# Patient Record
Sex: Female | Born: 1945
Health system: Southern US, Community
[De-identification: ages and names within clinical notes are randomized; demographics above are authoritative.]

## PROBLEM LIST (undated history)

## (undated) DIAGNOSIS — E119 Type 2 diabetes mellitus without complications: Secondary | ICD-10-CM

## (undated) DIAGNOSIS — Z860101 Personal history of adenomatous and serrated colon polyps: Secondary | ICD-10-CM

## (undated) DIAGNOSIS — R011 Cardiac murmur, unspecified: Secondary | ICD-10-CM

## (undated) DIAGNOSIS — I10 Essential (primary) hypertension: Secondary | ICD-10-CM

## (undated) DIAGNOSIS — E1140 Type 2 diabetes mellitus with diabetic neuropathy, unspecified: Secondary | ICD-10-CM

## (undated) DIAGNOSIS — J302 Other seasonal allergic rhinitis: Secondary | ICD-10-CM

## (undated) DIAGNOSIS — Z973 Presence of spectacles and contact lenses: Secondary | ICD-10-CM

## (undated) DIAGNOSIS — L732 Hidradenitis suppurativa: Secondary | ICD-10-CM

## (undated) DIAGNOSIS — Z923 Personal history of irradiation: Secondary | ICD-10-CM

## (undated) DIAGNOSIS — C50919 Malignant neoplasm of unspecified site of unspecified female breast: Secondary | ICD-10-CM

## (undated) DIAGNOSIS — R9389 Abnormal findings on diagnostic imaging of other specified body structures: Secondary | ICD-10-CM

## (undated) DIAGNOSIS — E785 Hyperlipidemia, unspecified: Secondary | ICD-10-CM

## (undated) DIAGNOSIS — Z972 Presence of dental prosthetic device (complete) (partial): Secondary | ICD-10-CM

## (undated) DIAGNOSIS — M1990 Unspecified osteoarthritis, unspecified site: Secondary | ICD-10-CM

## (undated) DIAGNOSIS — Z86010 Personal history of colonic polyps: Secondary | ICD-10-CM

## (undated) HISTORY — DX: Type 2 diabetes mellitus without complications: E11.9

## (undated) HISTORY — DX: Hyperlipidemia, unspecified: E78.5

## (undated) HISTORY — PX: BREAST SURGERY: SHX581

## (undated) HISTORY — DX: Hidradenitis suppurativa: L73.2

## (undated) HISTORY — DX: Other seasonal allergic rhinitis: J30.2

---

## 2000-07-24 ENCOUNTER — Ambulatory Visit (HOSPITAL_COMMUNITY): Admission: RE | Admit: 2000-07-24 | Discharge: 2000-07-24 | Payer: Self-pay | Admitting: Family Medicine

## 2000-07-24 ENCOUNTER — Encounter: Payer: Self-pay | Admitting: Family Medicine

## 2000-08-05 ENCOUNTER — Other Ambulatory Visit: Admission: RE | Admit: 2000-08-05 | Discharge: 2000-08-05 | Payer: Self-pay | Admitting: Family Medicine

## 2002-08-17 ENCOUNTER — Ambulatory Visit (HOSPITAL_COMMUNITY): Admission: RE | Admit: 2002-08-17 | Discharge: 2002-08-17 | Payer: Self-pay | Admitting: Family Medicine

## 2002-08-17 ENCOUNTER — Encounter: Payer: Self-pay | Admitting: Family Medicine

## 2004-05-08 ENCOUNTER — Ambulatory Visit: Payer: Self-pay | Admitting: Family Medicine

## 2004-05-16 ENCOUNTER — Ambulatory Visit (HOSPITAL_COMMUNITY): Admission: RE | Admit: 2004-05-16 | Discharge: 2004-05-16 | Payer: Self-pay | Admitting: Family Medicine

## 2004-05-16 LAB — HM MAMMOGRAPHY: HM Mammogram: NORMAL

## 2004-08-14 ENCOUNTER — Ambulatory Visit: Payer: Self-pay | Admitting: Family Medicine

## 2005-05-28 ENCOUNTER — Ambulatory Visit: Payer: Self-pay | Admitting: Family Medicine

## 2005-09-27 ENCOUNTER — Ambulatory Visit: Payer: Self-pay | Admitting: Family Medicine

## 2005-09-27 ENCOUNTER — Other Ambulatory Visit: Admission: RE | Admit: 2005-09-27 | Discharge: 2005-09-27 | Payer: Self-pay | Admitting: Family Medicine

## 2005-09-27 ENCOUNTER — Encounter: Payer: Self-pay | Admitting: Family Medicine

## 2005-12-27 ENCOUNTER — Ambulatory Visit: Payer: Self-pay | Admitting: Family Medicine

## 2006-05-12 ENCOUNTER — Ambulatory Visit: Payer: Self-pay | Admitting: Family Medicine

## 2006-07-16 ENCOUNTER — Encounter: Payer: Self-pay | Admitting: Family Medicine

## 2006-07-16 LAB — CONVERTED CEMR LAB: Hgb A1c MFr Bld: 7.2 % — ABNORMAL HIGH (ref 4.6–6.1)

## 2006-07-29 ENCOUNTER — Ambulatory Visit: Payer: Self-pay | Admitting: Family Medicine

## 2006-09-12 ENCOUNTER — Encounter: Payer: Self-pay | Admitting: Family Medicine

## 2006-09-12 LAB — CONVERTED CEMR LAB
ALT: 14 units/L (ref 0–35)
AST: 14 units/L (ref 0–37)
Albumin: 4.3 g/dL (ref 3.5–5.2)
Alkaline Phosphatase: 53 units/L (ref 39–117)
BUN: 10 mg/dL (ref 6–23)
Bilirubin, Direct: 0.1 mg/dL (ref 0.0–0.3)
CO2: 25 meq/L (ref 19–32)
Calcium: 9.5 mg/dL (ref 8.4–10.5)
Chloride: 104 meq/L (ref 96–112)
Cholesterol: 221 mg/dL — ABNORMAL HIGH (ref 0–200)
Creatinine, Ser: 0.87 mg/dL (ref 0.40–1.20)
Glucose, Bld: 109 mg/dL — ABNORMAL HIGH (ref 70–99)
HDL: 39 mg/dL — ABNORMAL LOW (ref >39)
Indirect Bilirubin: 0.2 mg/dL (ref 0.0–0.9)
LDL Cholesterol: 143 mg/dL — ABNORMAL HIGH (ref 0–99)
Potassium: 4.6 meq/L (ref 3.5–5.3)
Sodium: 141 meq/L (ref 135–145)
Total Bilirubin: 0.3 mg/dL (ref 0.3–1.2)
Total CHOL/HDL Ratio: 5.7
Total Protein: 7.4 g/dL (ref 6.0–8.3)
Triglycerides: 194 mg/dL — ABNORMAL HIGH (ref <150)
VLDL: 39 mg/dL (ref 0–40)

## 2006-09-26 ENCOUNTER — Ambulatory Visit: Payer: Self-pay | Admitting: Family Medicine

## 2007-02-13 ENCOUNTER — Encounter: Payer: Self-pay | Admitting: Family Medicine

## 2007-02-13 LAB — CONVERTED CEMR LAB
Cholesterol: 258 mg/dL — ABNORMAL HIGH (ref 0–200)
HDL: 43 mg/dL (ref >39)
LDL Cholesterol: 167 mg/dL — ABNORMAL HIGH (ref 0–99)
Total CHOL/HDL Ratio: 6
Triglycerides: 242 mg/dL — ABNORMAL HIGH (ref <150)
VLDL: 48 mg/dL — ABNORMAL HIGH (ref 0–40)

## 2007-02-16 ENCOUNTER — Ambulatory Visit: Payer: Self-pay | Admitting: Family Medicine

## 2007-02-16 LAB — CONVERTED CEMR LAB
ALT: 10 units/L (ref 0–35)
AST: 11 units/L (ref 0–37)
Albumin: 4.4 g/dL (ref 3.5–5.2)
Alkaline Phosphatase: 60 units/L (ref 39–117)
Total Bilirubin: 0.3 mg/dL (ref 0.3–1.2)

## 2007-04-24 ENCOUNTER — Ambulatory Visit: Payer: Self-pay | Admitting: Family Medicine

## 2007-08-07 ENCOUNTER — Ambulatory Visit: Payer: Self-pay | Admitting: Family Medicine

## 2007-08-10 ENCOUNTER — Encounter: Payer: Self-pay | Admitting: Family Medicine

## 2007-08-10 DIAGNOSIS — E1140 Type 2 diabetes mellitus with diabetic neuropathy, unspecified: Secondary | ICD-10-CM

## 2007-08-10 DIAGNOSIS — E1159 Type 2 diabetes mellitus with other circulatory complications: Secondary | ICD-10-CM

## 2007-08-10 DIAGNOSIS — L259 Unspecified contact dermatitis, unspecified cause: Secondary | ICD-10-CM

## 2007-08-10 DIAGNOSIS — J301 Allergic rhinitis due to pollen: Secondary | ICD-10-CM | POA: Insufficient documentation

## 2007-08-10 DIAGNOSIS — E1165 Type 2 diabetes mellitus with hyperglycemia: Secondary | ICD-10-CM | POA: Insufficient documentation

## 2007-08-10 DIAGNOSIS — E1169 Type 2 diabetes mellitus with other specified complication: Secondary | ICD-10-CM | POA: Insufficient documentation

## 2007-08-10 DIAGNOSIS — I10 Essential (primary) hypertension: Secondary | ICD-10-CM | POA: Insufficient documentation

## 2007-08-10 DIAGNOSIS — L732 Hidradenitis suppurativa: Secondary | ICD-10-CM | POA: Insufficient documentation

## 2007-08-10 DIAGNOSIS — E785 Hyperlipidemia, unspecified: Secondary | ICD-10-CM | POA: Insufficient documentation

## 2007-09-23 ENCOUNTER — Encounter: Payer: Self-pay | Admitting: Family Medicine

## 2007-10-08 ENCOUNTER — Encounter: Payer: Self-pay | Admitting: Family Medicine

## 2007-10-09 ENCOUNTER — Encounter: Payer: Self-pay | Admitting: Family Medicine

## 2007-11-23 ENCOUNTER — Encounter: Payer: Self-pay | Admitting: Family Medicine

## 2008-01-07 ENCOUNTER — Encounter: Payer: Self-pay | Admitting: Family Medicine

## 2008-01-11 ENCOUNTER — Telehealth: Payer: Self-pay | Admitting: Family Medicine

## 2008-01-11 LAB — CONVERTED CEMR LAB
ALT: 18 units/L (ref 0–35)
Bilirubin, Direct: 0.1 mg/dL (ref 0.0–0.3)
Cholesterol: 226 mg/dL — ABNORMAL HIGH (ref 0–200)
Total CHOL/HDL Ratio: 5.1
Total Protein: 7.2 g/dL (ref 6.0–8.3)
Triglycerides: 251 mg/dL — ABNORMAL HIGH (ref <150)
VLDL: 50 mg/dL — ABNORMAL HIGH (ref 0–40)

## 2008-01-18 ENCOUNTER — Ambulatory Visit: Payer: Self-pay | Admitting: Family Medicine

## 2008-01-18 LAB — CONVERTED CEMR LAB
Glucose, Bld: 94 mg/dL
Hgb A1c MFr Bld: 6.3 %

## 2008-01-28 ENCOUNTER — Telehealth: Payer: Self-pay | Admitting: Family Medicine

## 2008-03-04 ENCOUNTER — Ambulatory Visit: Payer: Self-pay | Admitting: Gastroenterology

## 2008-03-04 ENCOUNTER — Encounter: Payer: Self-pay | Admitting: Gastroenterology

## 2008-03-04 ENCOUNTER — Encounter: Payer: Self-pay | Admitting: Family Medicine

## 2008-03-04 ENCOUNTER — Ambulatory Visit (HOSPITAL_COMMUNITY): Admission: RE | Admit: 2008-03-04 | Discharge: 2008-03-04 | Payer: Self-pay | Admitting: Gastroenterology

## 2008-03-04 HISTORY — PX: COLONOSCOPY W/ POLYPECTOMY: SHX1380

## 2008-03-04 LAB — HM COLONOSCOPY

## 2008-03-07 ENCOUNTER — Encounter: Payer: Self-pay | Admitting: Family Medicine

## 2008-03-18 ENCOUNTER — Encounter: Payer: Self-pay | Admitting: Family Medicine

## 2008-04-08 ENCOUNTER — Encounter: Payer: Self-pay | Admitting: Family Medicine

## 2008-04-26 ENCOUNTER — Encounter: Payer: Self-pay | Admitting: Family Medicine

## 2008-05-16 ENCOUNTER — Ambulatory Visit: Payer: Self-pay | Admitting: Family Medicine

## 2008-09-16 ENCOUNTER — Ambulatory Visit: Payer: Self-pay | Admitting: Family Medicine

## 2008-09-26 LAB — CONVERTED CEMR LAB
AST: 13 units/L (ref 0–37)
Albumin: 4.2 g/dL (ref 3.5–5.2)
CO2: 24 meq/L (ref 19–32)
Chloride: 104 meq/L (ref 96–112)
HDL: 35 mg/dL — ABNORMAL LOW (ref >39)
LDL Cholesterol: 135 mg/dL — ABNORMAL HIGH (ref 0–99)
Microalb Creat Ratio: 8 mg/g (ref 0.0–30.0)
Sodium: 142 meq/L (ref 135–145)
Total Bilirubin: 0.3 mg/dL (ref 0.3–1.2)
Total CHOL/HDL Ratio: 6.6

## 2008-12-16 ENCOUNTER — Encounter: Payer: Self-pay | Admitting: Family Medicine

## 2009-02-09 ENCOUNTER — Ambulatory Visit: Payer: Self-pay | Admitting: Family Medicine

## 2009-02-09 DIAGNOSIS — R5381 Other malaise: Secondary | ICD-10-CM | POA: Insufficient documentation

## 2009-02-09 DIAGNOSIS — R5383 Other fatigue: Secondary | ICD-10-CM

## 2009-02-09 LAB — CONVERTED CEMR LAB: Blood Glucose, Fasting: 123 mg/dL

## 2009-02-14 LAB — CONVERTED CEMR LAB
ALT: 11 units/L (ref 0–35)
Albumin: 4.3 g/dL (ref 3.5–5.2)
Alkaline Phosphatase: 54 units/L (ref 39–117)
BUN: 11 mg/dL (ref 6–23)
Basophils Absolute: 0 10*3/uL (ref 0.0–0.1)
Chloride: 103 meq/L (ref 96–112)
Cholesterol: 191 mg/dL (ref 0–200)
Eosinophils Relative: 3 % (ref 0–5)
HCT: 37.5 % (ref 36.0–46.0)
HDL: 44 mg/dL (ref >39)
Indirect Bilirubin: 0.2 mg/dL (ref 0.0–0.9)
Lymphocytes Relative: 34 % (ref 12–46)
Lymphs Abs: 2 10*3/uL (ref 0.7–4.0)
Neutro Abs: 3.3 10*3/uL (ref 1.7–7.7)
Neutrophils Relative %: 57 % (ref 43–77)
Platelets: 312 10*3/uL (ref 150–400)
Potassium: 4.4 meq/L (ref 3.5–5.3)
RDW: 15.4 % (ref 11.5–15.5)
Total Protein: 7.2 g/dL (ref 6.0–8.3)
Triglycerides: 188 mg/dL — ABNORMAL HIGH (ref <150)
VLDL: 38 mg/dL (ref 0–40)
WBC: 5.9 10*3/uL (ref 4.0–10.5)

## 2009-03-06 ENCOUNTER — Encounter: Payer: Self-pay | Admitting: Family Medicine

## 2009-03-28 ENCOUNTER — Telehealth: Payer: Self-pay | Admitting: Family Medicine

## 2009-04-25 ENCOUNTER — Encounter: Payer: Self-pay | Admitting: Family Medicine

## 2009-06-07 ENCOUNTER — Ambulatory Visit: Payer: Self-pay | Admitting: Family Medicine

## 2009-06-12 ENCOUNTER — Encounter: Payer: Self-pay | Admitting: Family Medicine

## 2009-08-07 ENCOUNTER — Ambulatory Visit: Payer: Self-pay | Admitting: Family Medicine

## 2009-09-18 ENCOUNTER — Encounter: Payer: Self-pay | Admitting: Family Medicine

## 2009-09-18 LAB — CONVERTED CEMR LAB
Alkaline Phosphatase: 44 units/L (ref 39–117)
BUN: 10 mg/dL (ref 6–23)
Bilirubin, Direct: 0.1 mg/dL (ref 0.0–0.3)
CO2: 27 meq/L (ref 19–32)
Calcium: 9.5 mg/dL (ref 8.4–10.5)
Chloride: 102 meq/L (ref 96–112)
Creatinine, Ser: 0.84 mg/dL (ref 0.40–1.20)
Indirect Bilirubin: 0.2 mg/dL (ref 0.0–0.9)
LDL Cholesterol: 77 mg/dL (ref 0–99)
Total Bilirubin: 0.3 mg/dL (ref 0.3–1.2)

## 2009-09-22 ENCOUNTER — Ambulatory Visit: Payer: Self-pay | Admitting: Family Medicine

## 2009-09-22 LAB — HM DIABETES FOOT EXAM

## 2009-09-24 DIAGNOSIS — E559 Vitamin D deficiency, unspecified: Secondary | ICD-10-CM

## 2009-11-07 ENCOUNTER — Encounter: Payer: Self-pay | Admitting: Family Medicine

## 2010-01-31 LAB — CONVERTED CEMR LAB
ALT: 11 units/L (ref 0–35)
Bilirubin, Direct: 0.1 mg/dL (ref 0.0–0.3)
CO2: 28 meq/L (ref 19–32)
Chloride: 102 meq/L (ref 96–112)
Cholesterol: 157 mg/dL (ref 0–200)
Glucose, Bld: 127 mg/dL — ABNORMAL HIGH (ref 70–99)
Hgb A1c MFr Bld: 6.6 % — ABNORMAL HIGH (ref <5.7)
LDL Cholesterol: 74 mg/dL (ref 0–99)
Potassium: 4.5 meq/L (ref 3.5–5.3)
Sodium: 140 meq/L (ref 135–145)
Total Bilirubin: 0.3 mg/dL (ref 0.3–1.2)
Total CHOL/HDL Ratio: 3.7
VLDL: 41 mg/dL — ABNORMAL HIGH (ref 0–40)
Vit D, 25-Hydroxy: 38 ng/mL (ref 30–89)

## 2010-02-01 ENCOUNTER — Telehealth: Payer: Self-pay | Admitting: Family Medicine

## 2010-02-07 ENCOUNTER — Ambulatory Visit: Payer: Self-pay | Admitting: Family Medicine

## 2010-02-07 LAB — CONVERTED CEMR LAB: Microalb Creat Ratio: 15.6 mg/g (ref 0.0–30.0)

## 2010-02-13 ENCOUNTER — Ambulatory Visit: Payer: Self-pay | Admitting: Family Medicine

## 2010-02-16 LAB — HM DIABETES FOOT EXAM

## 2010-02-16 LAB — HM MAMMOGRAPHY

## 2010-02-16 LAB — HM COLONOSCOPY

## 2010-03-06 ENCOUNTER — Telehealth: Payer: Self-pay | Admitting: Family Medicine

## 2010-03-07 ENCOUNTER — Ambulatory Visit
Admission: RE | Admit: 2010-03-07 | Discharge: 2010-03-07 | Payer: Self-pay | Source: Home / Self Care | Attending: Family Medicine | Admitting: Family Medicine

## 2010-03-07 DIAGNOSIS — J029 Acute pharyngitis, unspecified: Secondary | ICD-10-CM | POA: Insufficient documentation

## 2010-03-11 ENCOUNTER — Encounter: Payer: Self-pay | Admitting: Family Medicine

## 2010-03-20 NOTE — Assessment & Plan Note (Signed)
Summary: office visit   Vital Signs:  Patient profile:   65 year old female Menstrual status:  postmenopausal Height:      69 inches Weight:      271 pounds BMI:     40.16 O2 Sat:      97 % Pulse rate:   68 / minute Pulse rhythm:   regular Resp:     16 per minute BP sitting:   122 / 74  (left arm) Cuff size:   large  Vitals Entered By: Everitt Amber LPN (September 22, 2009 8:18 AM)  Nutrition Counseling: Patient's BMI is greater than 25 and therefore counseled on weight management options. CC: Follow up chronic problems   CC:  Follow up chronic problems.  History of Present Illness: Reports  thatshe has been  doing well. She unfortunately still has not commited to regular exercise amd she has gained rather than lost weight. States she wants anapetitie suppressant, but does not want to committo coming in evry 2 months for evaluation. She tests her blood sugar at least 5 times weekly, fasting sugars are seldom ver 120. She denies intolerance to her meds. Denies recent fever or chills. Denies sinus pressure, nasal congestion , ear pain or sore throat. Denies chest congestion, or cough productive of sputum. Denies chest pain, palpitations, PND, orthopnea or leg swelling. Denies abdominal pain, nausea, vomitting, diarrhea or constipation. Denies change in bowel movements or bloody stool. Denies dysuria , frequency, incontinence or hesitancy. Denies  joint pain, swelling, or reduced mobility. Denies headaches, vertigo, seizures. Denies depression, anxiety or insomnia. She continues to have recurrent cysts under the arms, but does not wish to have surgery.   Current Medications (verified): 1)  Ziac 10-6.25 Mg  Tabs (Bisoprolol-Hydrochlorothiazide) .... One Tab By Mouth Once Daily 2)  Lovastatin 40 Mg  Tabs (Lovastatin) .... One Tab By Mouth At Bedtime 3)  Lotensin 20 Mg Tabs (Benazepril Hcl) .... Take 1 Tablet By Mouth Once A Day 4)  Metformin Hcl 500 Mg Xr24h-Tab (Metformin Hcl)  .... Two Tabs By Mouth Two Times A Day 5)  Freestyle Lite Test  Strp (Glucose Blood) .... Up To Four Times Daily 6)  Vitamin D (Ergocalciferol) 50000 Unit Caps (Ergocalciferol) .... One Cap By Mouth Every Week  Allergies (verified): No Known Drug Allergies  Review of Systems      See HPI General:  Complains of fatigue. Eyes:  Denies blurring, discharge, eye pain, and red eye; eye exam, past due states she will make an appt. MS:  Complains of stiffness. Derm:  Complains of lesion(s); severe hydrsdenitis of axillae. Endo:  Denies cold intolerance, excessive hunger, excessive thirst, excessive urination, heat intolerance, polyuria, and weight change. Heme:  Denies abnormal bruising and bleeding. Allergy:  Complains of seasonal allergies; denies hives or rash and itching eyes; symptoms mild, otc meds as needed.  Physical Exam  General:  Well-developed,obese,in no acute distress; alert,appropriate and cooperative throughout examination HEENT: No facial asymmetry,  EOMI, No sinus tenderness, TM's Clear, oropharynx  pink and moist.   Chest: Clear to auscultation bilaterally.  CVS: S1, S2, No murmurs, No S3.   Abd: Soft, Nontender.  MS: Adequate ROM spine, hips, shoulders and knees.  Ext: No edema.   CNS: CN 2-12 intact, power tone and sensation normal throughout.   Skin: cysts in axillae. no purulent drainage Psych: Good eye contact, normal affect.  Memory intact, not anxious or depressed appearing.   Diabetes Management Exam:    Foot Exam (with socks  and/or shoes not present):       Sensory-Monofilament:          Left foot: diminished          Right foot: diminished       Inspection:          Left foot: normal          Right foot: normal       Nails:          Left foot: thickened          Right foot: thickened   Impression & Recommendations:  Problem # 1:  HIDRADENITIS (ICD-705.83) Assessment Unchanged no interest in surgical intervention  Problem # 2:  HYPERTENSION  (ICD-401.9) Assessment: Unchanged  The following medications were removed from the medication list:    Ziac 10-6.25 Mg Tabs (Bisoprolol-hydrochlorothiazide) ..... One tab by mouth once daily    Lotensin 20 Mg Tabs (Benazepril hcl) .Marland Kitchen... Take 1 tablet by mouth once a day Her updated medication list for this problem includes:    Lotensin 20 Mg Tabs (Benazepril hcl) .Marland Kitchen... Take 1 tablet by mouth once a day    Ziac 10-6.25 Mg Tabs (Bisoprolol-hydrochlorothiazide) .Marland Kitchen... Take 1 tablet by mouth once a day  Orders: T-Basic Metabolic Panel [id number])  BP today: 122/74 Prior BP: 120/68 (08/07/2009)  Labs Reviewed: K+: 4.4 (09/18/2009) Creat: : 0.84 (09/18/2009)   Chol: 158 (09/18/2009)   HDL: 43 (09/18/2009)   LDL: 77 (09/18/2009)   TG: 189 (09/18/2009)  Problem # 3:  DIABETES MELLITUS, TYPE II (ICD-250.00) Assessment: Improved  The following medications were removed from the medication list:    Lotensin 20 Mg Tabs (Benazepril hcl) .Marland Kitchen... Take 1 tablet by mouth once a day Her updated medication list for this problem includes:    Metformin Hcl 500 Mg Xr24h-tab (Metformin hcl) .Marland Kitchen..Marland Kitchen Two tabs by mouth two times a day    Lotensin 20 Mg Tabs (Benazepril hcl) .Marland Kitchen... Take 1 tablet by mouth once a day  Orders: Ophthalmology Referral (Ophthalmology) T- Hemoglobin A1C ([id number]) T-Urine Microalbumin w/creat. ratio [phone])  Labs Reviewed: Creat: 0.84 (09/18/2009)    Reviewed HgBA1c results: 6.5 (09/18/2009)  6.7 (06/07/2009)  Problem # 4:  OBESITY (ICD-278.00) Assessment: Deteriorated  Ht: 69 (09/22/2009)   Wt: 271 (09/22/2009)   BMI: 40.16 (09/22/2009)  Problem # 5:  UNSPECIFIED VITAMIN D DEFICIENCY (ICD-268.9) Assessment: Improved  Complete Medication List: 1)  Metformin Hcl 500 Mg Xr24h-tab (Metformin hcl) .... Two tabs by mouth two times a day 2)  Freestyle Lite Test Strp (Glucose blood) .... Up to four times daily 3)  Lotensin 20 Mg Tabs (Benazepril hcl) .... Take 1  tablet by mouth once a day 4)  Lovastatin 40 Mg Tabs (Lovastatin) .... Take 1 tab by mouth at bedtime 5)  Ziac 10-6.25 Mg Tabs (Bisoprolol-hydrochlorothiazide) .... Take 1 tablet by mouth once a day  Other Orders: T-Hepatic Function [id number]) T-Lipid Profile [id number]) T-Vitamin D (25-Hydroxy) [id number])  Patient Instructions: 1)  f/u end  December. 2)  It is important that you exercise regularly at least 20 minutes 5 times a week. If you develop chest pain, have severe difficulty breathing, or feel very tired , stop exercising immediately and seek medical attention. 3)  You need to lose weight. Consider a lower calorie diet and regular exercise.  4)  vit d lipid, hepatic, chem 7, HBA1C. 5)  you will be referred for eye exam and mamogram 6)  pLS start calcium with vit D 1200mg /1000iU one daily Prescriptions:  ZIAC 10-6.25 MG TABS (BISOPROLOL-HYDROCHLOROTHIAZIDE) Take 1 tablet by mouth once a day  #90 x 1   Entered and Authorized by:   Syliva Overman MD   Signed by:   Syliva Overman MD on 09/22/2009   Method used:   Electronically to        [facility]* (retail)       [address]       [place], [place]  [address]       Ph: [phone] or [phone]       Fax: [phone]   RxID:   [id number] LOVASTATIN 40 MG TABS (LOVASTATIN) Take 1 tab by mouth at bedtime  #90 x 1   Entered and Authorized by:   Syliva Overman MD   Signed by:   Syliva Overman MD on 09/22/2009   Method used:   Electronically to        [facility]* (retail)       [address]       [place], [place]  [address]       Ph: [phone] or [phone]       Fax: [phone]   RxID:   [id number] LOTENSIN 20 MG TABS (BENAZEPRIL HCL) Take 1 tablet by mouth once a day  #90 x 1   Entered and Authorized by:   Syliva Overman MD   Signed by:   Syliva Overman MD on 09/22/2009   Method used:   Electronically to        [facility]* (retail)       [address]       [place], [place]  [address]       Ph: [phone] or [phone]       Fax: [phone]   RxID:   [id number] METFORMIN HCL 500 MG XR24H-TAB (METFORMIN HCL) two tabs by mouth two times a day  #120 x 3   Entered by:   Everitt Amber LPN   Authorized by:   Syliva Overman MD   Signed by:   Everitt Amber LPN on 81/82/9937   Method used:   Electronically to        [facility]* (retail)       [address]       [place], [place]  [address]       Ph: [phone] or [phone]       Fax: [phone]   RxID:   [id number] LOTENSIN 20 MG TABS (BENAZEPRIL HCL) Take 1 tablet by mouth once a day  #30 x 3   Entered by:   Everitt Amber LPN   Authorized by:   Syliva Overman MD   Signed by:   Everitt Amber LPN on 67/01/4579   Method used:   Electronically to        [facility] (retail)       [address]       [place], [place]  [address]       Ph: [phone] or [phone]       Fax: [phone]   RxID:   [id number] LOVASTATIN 40 MG  TABS (LOVASTATIN) one tab by mouth at bedtime  #30 x 3   Entered by:   Everitt Amber LPN   Authorized by:   Syliva Overman MD   Signed  by:   Everitt Amber LPN on 62/13/0865   Method used:   Electronically to        [facility] (retail)       [address]       [place], [place]  [address]       Ph: [phone] or [phone]       Fax: [phone]   RxID:   [id number] ZIAC 10-6.25 MG  TABS (BISOPROLOL-HYDROCHLOROTHIAZIDE) one tab by mouth once daily  #30 x 3   Entered by:   Everitt Amber LPN   Authorized by:   Syliva Overman MD   Signed by:   Everitt Amber LPN on 88/41/6606   Method used:   Electronically to        [facility] (retail)       [address]       [place], [place]  [address]       Ph: [phone] or [phone]       Fax: [phone]    RxID:   [id number]   Appended Document: office visit pt needs both eye exam , I think she said she will schedule both of them, pls check with her before scheduling either  Appended Document: office visit pt information was sent to dr. Nile Riggs office. they will call pt with appt and do a eye exam on both eyes.

## 2010-03-20 NOTE — Miscellaneous (Signed)
Summary: refill  Clinical Lists Changes  Medications: Rx of ZIAC 10-6.25 MG  TABS (BISOPROLOL-HYDROCHLOROTHIAZIDE) one tab by mouth once daily;  #30 x 4;  Signed;  Entered by: Worthy Keeler LPN;  Authorized by: Syliva Overman MD;  Method used: Electronically to [facility] [address], [place], [place], [place]  [address], Ph: [phone] or [phone], Fax: [phone]    Prescriptions: ZIAC 10-6.25 MG  TABS (BISOPROLOL-HYDROCHLOROTHIAZIDE) one tab by mouth once daily  #30 x 4   Entered by:   Worthy Keeler LPN   Authorized by:   Syliva Overman MD   Signed by:   Worthy Keeler LPN on 42/59/5638   Method used:   Electronically to        [facility] (retail)       [address]       [place], [place]  [address]       Ph: [phone] or [phone]       Fax: [phone]   RxID:   [id number]

## 2010-03-20 NOTE — Letter (Signed)
[facility]: [facility]   Imported By: Lind Guest 11/08/2009 09:02:52  _____________________________________________________________________  External Attachment:    Type:   Image     Comment:   External Document

## 2010-03-20 NOTE — Letter (Signed)
Summary: Letter  Letter   Imported By: Lind Guest 06/13/2009 11:51:40  _____________________________________________________________________  External Attachment:    Type:   Image     Comment:   External Document

## 2010-03-20 NOTE — Assessment & Plan Note (Signed)
Summary: office visit   Vital Signs:  Patient profile:   65 year old female Menstrual status:  postmenopausal Height:      69 inches Weight:      270.50 pounds BMI:     40.09 O2 Sat:      95 % Pulse rate:   88 / minute Pulse rhythm:   regular Resp:     16 per minute BP sitting:   112 / 70  (left arm) Cuff size:   large  Vitals Entered By: Everitt Amber LPN (June 07, 2009 3:21 PM)  Nutrition Counseling: Patient's BMI is greater than 25 and therefore counseled on weight management options. CC: Follow up chronic problems   CC:  Follow up chronic problems.  History of Present Illness: Reports  thatshe has been  doing well. Denies recent fever or chills. Denies sinus pressure, nasal congestion , ear pain or sore throat. Denies chest congestion, or cough productive of sputum. Denies chest pain, palpitations, PND, orthopnea or leg swelling. Denies abdominal pain, nausea, vomitting, diarrhea or constipation. Denies change in bowel movements or bloody stool. Denies dysuria , frequency, incontinence or hesitancy. Denies  joint pain, swelling, or reduced mobility. Denies headaches, vertigo, seizures. Denies depression, anxiety or insomnia. continued left axillary adenitis, no definitive treatment desired at this time. Pt not exercising regularly, and not consitent with dietary modification     Current Medications (verified): 1)  Ziac 10-6.25 Mg  Tabs (Bisoprolol-Hydrochlorothiazide) .... One Tab By Mouth Once Daily 2)  Lovastatin 40 Mg  Tabs (Lovastatin) .... One Tab By Mouth At Bedtime 3)  Clindamycin Phosphate 1 % Soln (Clindamycin Phosphate) .... Apply To Underarms Two Times A Day 4)  Lotensin 20 Mg Tabs (Benazepril Hcl) .... Take 1 Tablet By Mouth Once A Day 5)  Metformin Hcl 500 Mg Xr24h-Tab (Metformin Hcl) .... Two Tabs By Mouth Two Times A Day 6)  Freestyle Lite Test  Strp (Glucose Blood) .... Up To Four Times Daily  Allergies (verified): No Known Drug  Allergies  Review of Systems      See HPI Eyes:  Denies blurring and discharge. Derm:  Complains of lesion(s); swevere hydranitis under both axillae, lef worse than right , no currentdrainage. Endo:  Denies cold intolerance, excessive hunger, excessive thirst, excessive urination, heat intolerance, polyuria, and weight change; tests daily ranges are from 90 to 120. Heme:  Denies abnormal bruising and bleeding. Allergy:  Complains of seasonal allergies.  Physical Exam  General:  Well-developed,obese,in no acute distress; alert,appropriate and cooperative throughout examination HEENT: No facial asymmetry,  EOMI, No sinus tenderness, TM's Clear, oropharynx  pink and moist.   Chest: Clear to auscultation bilaterally.  CVS: S1, S2, No murmurs, No S3.   Abd: Soft, Nontender.  MS: Adequate ROM spine, hips, shoulders and knees.  Ext: No edema.   CNS: CN 2-12 intact, power tone and sensation normal throughout.   Skin: Intact, cystic red lesions in axillae Psych: Good eye contact, normal affect.  Memory intact, not anxious or depressed appearing.    Impression & Recommendations:  Problem # 1:  HIDRADENITIS (ICD-705.83) Assessment Unchanged  Problem # 2:  HYPERLIPIDEMIA (ICD-272.4) Assessment: Improved  Her updated medication list for this problem includes:    Lovastatin 40 Mg Tabs (Lovastatin) ..... One tab by mouth at bedtime  Orders: T-Hepatic Function [id number]) T-Lipid Profile [id number])  Labs Reviewed: SGOT: 11 (02/09/2009)   SGPT: 11 (02/09/2009)   HDL:44 (02/09/2009), 35 (09/16/2008)  LDL:109 (02/09/2009), 135 (09/16/2008)  Chol:191 (02/09/2009), 232 (09/16/2008)  Trig:188 (02/09/2009), 308 (09/16/2008)improved , based on labs drawn in march 2011  Problem # 3:  HYPERTENSION (ICD-401.9) Assessment: Improved  Her updated medication list for this problem includes:    Ziac 10-6.25 Mg Tabs (Bisoprolol-hydrochlorothiazide) ..... One tab by mouth once daily    Lotensin  20 Mg Tabs (Benazepril hcl) .Marland Kitchen... Take 1 tablet by mouth once a day  Orders: T-Basic Metabolic Panel [id number])  BP today: 112/70 Prior BP: 122/82 (02/09/2009)  Labs Reviewed: K+: 4.4 (02/09/2009) Creat: : 0.77 (02/09/2009)   Chol: 191 (02/09/2009)   HDL: 44 (02/09/2009)   LDL: 109 (02/09/2009)   TG: 188 (02/09/2009)  Problem # 4:  OBESITY (ICD-278.00) Assessment: Unchanged  Ht: 69 (06/07/2009)   Wt: 270.50 (06/07/2009)   BMI: 40.09 (06/07/2009)  Problem # 5:  DIABETES MELLITUS, TYPE II (ICD-250.00) Assessment: Comment Only  Her updated medication list for this problem includes:    Lotensin 20 Mg Tabs (Benazepril hcl) .Marland Kitchen... Take 1 tablet by mouth once a day    Metformin Hcl 500 Mg Xr24h-tab (Metformin hcl) .Marland Kitchen..Marland Kitchen Two tabs by mouth two times a day  Orders: T- Hemoglobin A1C ([id number]) T- Hemoglobin A1C ([id number])  Labs Reviewed: Creat: 0.77 (02/09/2009)    Reviewed HgBA1c results: 6.7 (02/09/2009)  6.8 (09/16/2008)  Complete Medication List: 1)  Ziac 10-6.25 Mg Tabs (Bisoprolol-hydrochlorothiazide) .... One tab by mouth once daily 2)  Lovastatin 40 Mg Tabs (Lovastatin) .... One tab by mouth at bedtime 3)  Clindamycin Phosphate 1 % Soln (Clindamycin phosphate) .... Apply to underarms two times a day 4)  Lotensin 20 Mg Tabs (Benazepril hcl) .... Take 1 tablet by mouth once a day 5)  Metformin Hcl 500 Mg Xr24h-tab (Metformin hcl) .... Two tabs by mouth two times a day 6)  Freestyle Lite Test Strp (Glucose blood) .... Up to four times daily  Other Orders: T-Vitamin D (25-Hydroxy) [id number])  Patient Instructions: 1)  Please schedule a follow-up appointment in 4 months. 2)  It is important that you exercise regularly at least 20 minutes 5 times a week. If you develop chest pain, have severe difficulty breathing, or feel very tired , stop exercising immediately and seek medical attention. 3)  You need to lose weight. Consider a lower calorie diet and regular  exercise.  4)  HbgA1C prior to visit, ICD-9:   today 5)  Vitamin V 6)  fasting lipid , hepatic chem7, and HBA1C in  4 months 7)  Pls follow a low fat , low carb diet

## 2010-03-20 NOTE — Progress Notes (Signed)
  Phone Note From Pharmacy   Caller: [facility]* Summary of Call: can you change metformin to ER, its coated- the regular smells soo bad she cant take it er 500mg  two two times a day  Initial call taken by: Worthy Keeler LPN,  March 28, 2009 2:21 PM  Follow-up for Phone Call        pls change to requested form and keep same dose and directins, let herknow Follow-up by: Syliva Overman MD,  March 28, 2009 4:59 PM  Additional Follow-up for Phone Call Additional follow up Details #1::        Prescription resent Additional Follow-up by: Worthy Keeler LPN,  March 29, 2009 9:10 AM    New/Updated Medications: METFORMIN HCL 500 MG XR24H-TAB (METFORMIN HCL) two tabs by mouth two times a day Prescriptions: METFORMIN HCL 500 MG XR24H-TAB (METFORMIN HCL) two tabs by mouth two times a day  #120 x 2   Entered by:   Worthy Keeler LPN   Authorized by:   Syliva Overman MD   Signed by:   Worthy Keeler LPN on 40/34/7425   Method used:   Electronically to        [facility]* (retail)       [address]       [place], [place]  [address]       Ph: [phone] or [phone]       Fax: [phone]   RxID:   [id number]

## 2010-03-20 NOTE — Assessment & Plan Note (Signed)
Summary: sick- room 2   Vital Signs:  Patient profile:   65 year old female Menstrual status:  postmenopausal Height:      69 inches Weight:      268.75 pounds BMI:     39.83 O2 Sat:      96 % on Room air Temp:     99 degrees F oral Pulse rate:   85 / minute Resp:     16 per minute BP sitting:   120 / 68  (left arm)  Vitals Entered By: Adella Hare LPN (August 07, 2009 1:59 PM) CC: chest congestion, cough with green sputum Is Patient Diabetic? No Pain Assessment Patient in pain? no      Comments did not bring meds to ov   CC:  chest congestion and cough with green sputum.  History of Present Illness: Pt presents today with c/o chest congestion and cough x 1 week.  Cough is productive with green phlegm.  No nasal congestion or drainage.  Pt states her syptoms are worsening.  She has been using an over the counter cough med w/o much improvement.  She had a sore thrt at onset, but is gone now. Pt has a hx of seansonal allergies but denies current syptoms.  She has no other complaints or concerns.  She is due in Aug for her check up for HTN and DM.  Pt has not been checking her blood sugars recently.     Allergies (verified): No Known Drug Allergies  Past History:  Past medical history reviewed for relevance to current acute and chronic problems.  Past Medical History: Reviewed history from 08/10/2007 and no changes required. Current Problems:  ALLERGIC RHINITIS, SEASONAL (ICD-477.0) DERMATITIS, FEET (ICD-692.9) HIDRADENITIS (ICD-705.83) OBESITY (ICD-278.00) HYPERTENSION (ICD-401.9) DIABETES MELLITUS, TYPE II (ICD-250.00) HYPERLIPIDEMIA (ICD-272.4)  Review of Systems General:  Denies chills and fever. ENT:  Denies earache, nasal congestion, postnasal drainage, sinus pressure, and sore throat. CV:  Denies chest pain or discomfort. Resp:  Complains of cough and sputum productive; denies shortness of breath and wheezing.  Physical Exam  General:   Well-developed,well-nourished,in no acute distress; alert,appropriate and cooperative throughout examination Head:  Normocephalic and atraumatic without obvious abnormalities. No apparent alopecia or balding. Ears:  External ear exam shows no significant lesions or deformities.  Otoscopic examination reveals clear canals, tympanic membranes are intact bilaterally without bulging, retraction, inflammation or discharge. Hearing is grossly normal bilaterally. Nose:  External nasal examination shows no deformity or inflammation. Nasal mucosa are pink and moist without lesions or exudates.no sinus percussion tenderness.   Mouth:  Oral mucosa and oropharynx without lesions or exudates. . Neck:  No deformities, masses, or tenderness noted. Lungs:  Normal respiratory effort, chest expands symmetrically. Lungs are clear to auscultation, no crackles or wheezes. Heart:  Normal rate and regular rhythm. S1 and S2 normal without gallop, murmur, click, rub or other extra sounds. Cervical Nodes:  No lymphadenopathy noted Psych:  Cognition and judgment appear intact. Alert and cooperative with normal attention span and concentration. No apparent delusions, illusions, hallucinations   Impression & Recommendations:  Problem # 1:  ACUTE BRONCHITIS (ICD-466.0) Assessment New Pt declined injection.  Her updated medication list for this problem includes:    Benzonatate 100 Mg Caps (Benzonatate) .Marland Kitchen... Take 1 every 8 hrs as needed for cough    Cephalexin 500 Mg Tabs (Cephalexin) .Marland Kitchen... Take 1 three times a day x 10 days  Problem # 2:  HYPERTENSION (ICD-401.9) Assessment: Comment Only  Her updated medication list  for this problem includes:    Ziac 10-6.25 Mg Tabs (Bisoprolol-hydrochlorothiazide) ..... One tab by mouth once daily    Lotensin 20 Mg Tabs (Benazepril hcl) .Marland Kitchen... Take 1 tablet by mouth once a day  BP today: 120/68 Prior BP: 112/70 (06/07/2009)  Labs Reviewed: K+: 4.4 (02/09/2009) Creat: : 0.77  (02/09/2009)   Chol: 191 (02/09/2009)   HDL: 44 (02/09/2009)   LDL: 109 (02/09/2009)   TG: 188 (02/09/2009)  Complete Medication List: 1)  Ziac 10-6.25 Mg Tabs (Bisoprolol-hydrochlorothiazide) .... One tab by mouth once daily 2)  Lovastatin 40 Mg Tabs (Lovastatin) .... One tab by mouth at bedtime 3)  Lotensin 20 Mg Tabs (Benazepril hcl) .... Take 1 tablet by mouth once a day 4)  Metformin Hcl 500 Mg Xr24h-tab (Metformin hcl) .... Two tabs by mouth two times a day 5)  Freestyle Lite Test Strp (Glucose blood) .... Up to four times daily 6)  Vitamin D (ergocalciferol) 50000 Unit Caps (Ergocalciferol) .... One cap by mouth every week 7)  Benzonatate 100 Mg Caps (Benzonatate) .... Take 1 every 8 hrs as needed for cough 8)  Cephalexin 500 Mg Tabs (Cephalexin) .... Take 1 three times a day x 10 days  Patient Instructions: 1)  Please schedule a follow-up appointment in 2 months wit Dr Lodema Hong.  2)  Call if your syptoms do not improve in the next 1 week. 3)  I have prescribed an antibiotic for you and some cough medicine. 4)  Increase your fluids and rest. Prescriptions: CEPHALEXIN 500 MG TABS (CEPHALEXIN) take 1 three times a day x 10 days  #30 x 0   Entered and Authorized by:   Esperanza Sheets PA   Signed by:   Esperanza Sheets PA on 08/07/2009   Method used:   Electronically to        [facility]* (retail)       [address]       [place], [place]  [address]       Ph: [phone] or [phone]       Fax: [phone]   RxID:   [id number] BENZONATATE 100 MG CAPS (BENZONATATE) take 1 every 8 hrs as needed for cough  #30 x 0   Entered and Authorized by:   Esperanza Sheets PA   Signed by:   Esperanza Sheets PA on 08/07/2009   Method used:   Electronically to        [facility]* (retail)       [address]       [place], [place]  [address]       Ph: [phone] or [phone]       Fax: [phone]   RxID:   [id number]

## 2010-03-22 NOTE — Progress Notes (Signed)
Summary: sore throat  Phone Note Call from Patient   Summary of Call: sore throat wants to be seen  call back at [phone] Initial call taken by: Lind Guest,  March 06, 2010 8:42 AM  Follow-up for Phone Call        patient states she cant hardly swallow, feels like somthing is hung in her throat and her throat is very sore Follow-up by: Adella Hare LPN,  March 06, 2010 9:55 AM  Additional Follow-up for Phone Call Additional follow up Details #1::        pls give work in appt today Additional Follow-up by: Syliva Overman MD,  March 06, 2010 10:01 AM    Additional Follow-up for Phone Call Additional follow up Details #2::    she is here Follow-up by: Lind Guest,  March 07, 2010 11:19 AM

## 2010-03-22 NOTE — Assessment & Plan Note (Signed)
Summary: follow-up   Vital Signs:  Patient profile:   65 year old female Menstrual status:  postmenopausal Height:      69 inches Weight:      271 pounds BMI:     40.16 Pulse rate:   83 / minute Pulse rhythm:   regular Resp:     16 per minute BP sitting:   122 / 70  (left arm)  Vitals Entered By: Adella Hare LPN (February 07, 2010 4:08 PM)  Nutrition Counseling: Patient's BMI is greater than 25 and therefore counseled on weight management options. CC: follow-up visit Is Patient Diabetic? Yes Did you bring your meter with you today? No   CC:  follow-up visit.  History of Present Illness: Reports  that she has been doing fairly well. Denies recent fever or chills. Denies sinus pressure, nasal congestion , ear pain or sore throat. Denies chest congestion, or cough productive of sputum. Denies chest pain, palpitations, PND, orthopnea or leg swelling. Denies abdominal pain, nausea, vomitting, diarrhea or constipation. Denies change in bowel movements or bloody stool. Denies dysuria , frequency, incontinence or hesitancy.  Denies headaches, vertigo, seizures. Denies depression, anxiety or insomnia.      Current Medications (verified): 1)  Metformin Hcl 500 Mg Xr24h-Tab (Metformin Hcl) .... Two Tabs By Mouth Two Times A Day 2)  Freestyle Lite Test  Strp (Glucose Blood) .... Up To Four Times Daily 3)  Lotensin 20 Mg Tabs (Benazepril Hcl) .... Take 1 Tablet By Mouth Once A Day 4)  Lovastatin 40 Mg Tabs (Lovastatin) .... Take 1 Tab By Mouth At Bedtime 5)  Ziac 10-6.25 Mg Tabs (Bisoprolol-Hydrochlorothiazide) .... Take 1 Tablet By Mouth Once A Day 6)  Super Calcium/d 600-125 Mg-Unit Tabs (Calcium-Vitamin D) .... One Tab By Mouth Two Times A Day  Allergies (verified): No Known Drug Allergies  Review of Systems      See HPI General:  Complains of fatigue. Eyes:  Denies discharge, eye pain, and red eye. MS:  Complains of joint pain and stiffness; bilateralknee pain right  grtr than left. Derm:  Complains of lesion(s) and poor wound healing; hydradenitis . Endo:  Denies cold intolerance, excessive hunger, excessive thirst, and excessive urination. Heme:  Denies abnormal bruising and bleeding. Allergy:  Denies hives or rash and itching eyes.  Physical Exam  General:  Well-developed,obese,in no acute distress; alert,appropriate and cooperative throughout examination HEENT: No facial asymmetry,  EOMI, No sinus tenderness, TM's Clear, oropharynx  pink and moist.   Chest: Clear to auscultation bilaterally.  CVS: S1, S2, No murmurs, No S3.   Abd: Soft, Nontender.  MS: Adequate ROM spine, hips, shoulders and knees.  Ext: No edema.   CNS: CN 2-12 intact, power tone and sensation normal throughout.   Skin: cysts in axillae. no purulent drainage Psych: Good eye contact, normal affect.  Memory intact, not anxious or depressed appearing.    Impression & Recommendations:  Problem # 1:  HIDRADENITIS (ICD-705.83) Assessment Unchanged  Problem # 2:  HYPERTENSION (ICD-401.9) Assessment: Unchanged  Her updated medication list for this problem includes:    Lotensin 20 Mg Tabs (Benazepril hcl) .Marland Kitchen... Take 1 tablet by mouth once a day    Ziac 10-6.25 Mg Tabs (Bisoprolol-hydrochlorothiazide) .Marland Kitchen... Take 1 tablet by mouth once a day  Orders: T-Basic Metabolic Panel ([id number])  BP today: 122/70 Prior BP: 122/74 (09/22/2009)  Labs Reviewed: K+: 4.5 (01/31/2010) Creat: : 0.84 (01/31/2010)   Chol: 157 (01/31/2010)   HDL: 42 (01/31/2010)   LDL: 74 (01/31/2010)  TG: 207 (01/31/2010)  Problem # 3:  DIABETES MELLITUS, TYPE II (ICD-250.00) Assessment: Unchanged  Her updated medication list for this problem includes:    Metformin Hcl 500 Mg Xr24h-tab (Metformin hcl) .Marland Kitchen..Marland Kitchen Two tabs by mouth two times a day    Lotensin 20 Mg Tabs (Benazepril hcl) .Marland Kitchen... Take 1 tablet by mouth once a day  Orders: T- Hemoglobin A1C ([id number])  Labs Reviewed: Creat: 0.84  (01/31/2010)    Reviewed HgBA1c results: 6.6 (01/31/2010)  6.5 (09/18/2009)  Problem # 4:  OBESITY (ICD-278.00) Assessment: Deteriorated  Ht: 69 (02/07/2010)   Wt: 271 (02/07/2010)   BMI: 40.16 (02/07/2010)  Complete Medication List: 1)  Metformin Hcl 500 Mg Xr24h-tab (Metformin hcl) .... Two tabs by mouth two times a day 2)  Freestyle Lite Test Strp (Glucose blood) .... Up to four times daily 3)  Lotensin 20 Mg Tabs (Benazepril hcl) .... Take 1 tablet by mouth once a day 4)  Lovastatin 40 Mg Tabs (Lovastatin) .... Take 1 tab by mouth at bedtime 5)  Ziac 10-6.25 Mg Tabs (Bisoprolol-hydrochlorothiazide) .... Take 1 tablet by mouth once a day 6)  Super Calcium/d 600-125 Mg-unit Tabs (Calcium-vitamin d) .... One tab by mouth two times a day  Other Orders: T-Lipid Profile [phone]) T-Hepatic Function [phone]) T-CBC w/Diff [phone]) T-TSH [id number])  Patient Instructions: 1)  Please schedule a follow-up appointment in 4 months months. 2)  It is important that you exercise regularly at least 20 minutes 5 times a week. If you develop chest pain, have severe difficulty breathing, or feel very tired , stop exercising immediately and seek medical attention. 3)  You need to lose weight. Consider a lower calorie diet and regular exercise.  4)  BMP prior to visit, ICD-9: 5)  Hepatic Panel prior to visit, ICD-9: 6)  Lipid Panel prior to visit, ICD-9:  fasting in 4months. 7)  HbgA1C prior to visit, ICD-9: 8)  TSH prior to visit, ICD-9: 9)  CBC w/ Diff prior to visit, ICD-9: 10)  YOU NEED TO SCHED A MAMOGRAM. 11)  YOU NEED TO RETURN 3 STOOL CARDS FOR COLON CANCER SCREENING. 12)  YOU NEED A PELVIC AND PAP 13)  Pls cut down on red meat, fried foods and butter and cheese, your triglycerides are too high Prescriptions: ZIAC 10-6.25 MG TABS (BISOPROLOL-HYDROCHLOROTHIAZIDE) Take 1 tablet by mouth once a day  #90 x 1   Entered by:   Adella Hare LPN   Authorized by:   Syliva Overman MD   Signed by:   Adella Hare LPN on 38/75/6433   Method used:   Electronically to        [facility] (retail)       [address]       [place], [place]  [address]       Ph: [phone] or [phone]       Fax: [phone]   RxID:   [id number] LOVASTATIN 40 MG TABS (LOVASTATIN) Take 1 tab by mouth at bedtime  #90 x 1   Entered by:   Adella Hare LPN   Authorized by:   Syliva Overman MD   Signed by:   Adella Hare LPN on 07/37/1062   Method used:   Electronically to        [facility] (retail)       [address]       [place], [place]  [address]  Ph: [phone] or [phone]       Fax: [phone]   RxID:   [id number] LOTENSIN 20 MG TABS (BENAZEPRIL HCL) Take 1 tablet by mouth once a day  #90 x 1   Entered by:   Adella Hare LPN   Authorized by:   Syliva Overman MD   Signed by:   Adella Hare LPN on 56/43/3295   Method used:   Electronically to        [facility] (retail)       [address]       [place], [place]  [address]       Ph: [phone] or [phone]       Fax: [phone]   RxID:   [id number] FREESTYLE LITE TEST  STRP (GLUCOSE BLOOD) up to four times daily  #50 x 5   Entered by:   Adella Hare LPN   Authorized by:   Syliva Overman MD   Signed by:   Adella Hare LPN on 71/07/2692   Method used:   Electronically to        [facility] (retail)       [address]       [place], [place]  [address]       Ph: [phone] or [phone]       Fax: [phone]   RxID:   [id number] METFORMIN HCL 500 MG XR24H-TAB (METFORMIN HCL) two tabs by mouth two times a day  #120 x 4   Entered by:   Adella Hare LPN   Authorized by:   Syliva Overman MD   Signed by:   Adella Hare LPN on 61/44/3154   Method used:   Electronically to        [facility] (retail)       [address]        [place], [place]  [address]       Ph: [phone] or [phone]       Fax: [phone]   RxID:   [id number]    Orders Added: 1)  Est. Patient Level IV [[address]] 2)  T-Lipid Profile [80061-22930] 3)  T-Hepatic Function [80076-22960] 4)  T-Basic Metabolic Panel [80048-22910] 5)  T-CBC w/Diff [42683-41962] 6)  T- Hemoglobin A1C [83036-23375] 7)  T-TSH [22979-89211]

## 2010-03-22 NOTE — Progress Notes (Signed)
  Phone Note Other Incoming   Caller: dr simpson Summary of Call: pls order and ask pt to submit microalb, ins co is cqlling aboput it, she is diabetic Initial call taken by: Syliva Overman MD,  February 01, 2010 12:13 PM  Follow-up for Phone Call        will fax order to lab, called patient, left message Follow-up by: Adella Hare LPN,  February 02, 2010 4:16 PM  Additional Follow-up for Phone Call Additional follow up Details #1::        patient aware Additional Follow-up by: Adella Hare LPN,  February 05, 2010 11:46 AM

## 2010-03-28 NOTE — Assessment & Plan Note (Signed)
Summary: ov per dr   Vital Signs:  Patient profile:   65 year old female Menstrual status:  postmenopausal Height:      69 inches Weight:      270.25 pounds BMI:     40.05 O2 Sat:      93 % on Room air Pulse rate:   89 / minute Pulse rhythm:   regular Resp:     16 per minute BP sitting:   120 / 80  (left arm) Cuff size:   large  Vitals Entered By: Everitt Amber LPN (March 07, 2010 11:24 AM)  Nutrition Counseling: Patient's BMI is greater than 25 and therefore counseled on weight management options.  O2 Flow:  Room air CC: Patient c/o scratchy throat, coughing, not really producing any mucus, denies fever chills or any body aches   CC:  Patient c/o scratchy throat, coughing, not really producing any mucus, and denies fever chills or any body aches.  History of Present Illness: Reports  that she has not been doing well. Denies recent fever or chills. Denies sinus pressure, nasal congestion , ear pain but has sore throat. Denies chest congestion, or cough productive of sputum.She has a tickle in the throat with a chronic dry cough which is worsening Denies chest pain, palpitations, PND, orthopnea or leg swelling. Denies abdominal pain, nausea, vomitting, diarrhea or constipation. Denies change in bowel movements or bloody stool. Denies dysuria , frequency, incontinence or hesitancy. Denies  joint pain, swelling, or reduced mobility. Denies headaches, vertigo, seizures. Denies depression, anxiety or insomnia. Denies  rash, lesions, or itch.     Allergies (verified): 1)  ! Ace Inhibitors  Review of Systems      See HPI General:  Complains of fatigue and malaise. Eyes:  Denies discharge, eye pain, and red eye. ENT:  Complains of sore throat. Resp:  Complains of cough; denies sputum productive and wheezing. Endo:  Denies cold intolerance, excessive hunger, excessive thirst, excessive urination, and heat intolerance. Heme:  Denies abnormal bruising and bleeding. Allergy:   Denies hives or rash and itching eyes.  Physical Exam  General:  Well-developed,obese,in no acute distress; alert,appropriate and cooperative throughout examination HEENT: No facial asymmetry,  EOMI, No sinus tenderness, TM's Clear, oropharynx  erythematous  and moist. No purulent exudate in the pharynx  Chest: Clear to auscultation bilaterally.  CVS: S1, S2, No murmurs, No S3.   Abd: Soft, Nontender.  MS: Adequate ROM spine, hips, shoulders and knees.  Ext: No edema.   CNS: CN 2-12 intact, power tone and sensation normal throughout.   Skin: cysts in axillae. no purulent drainage Psych: Good eye contact, normal affect.  Memory intact, not anxious or depressed appearing.    Impression & Recommendations:  Problem # 1:  ACUTE PHARYNGITIS (ICD-462) Assessment Comment Only  Her updated medication list for this problem includes:    Penicillin V Potassium 500 Mg Tabs (Penicillin v potassium) .Marland Kitchen... Take 1 tablet by mouth three times a day  Problem # 2:  DIABETES MELLITUS, TYPE II (ICD-250.00) Assessment: Comment Only  The following medications were removed from the medication list:    Lotensin 20 Mg Tabs (Benazepril hcl) .Marland Kitchen... Take 1 tablet by mouth once a day Her updated medication list for this problem includes:    Metformin Hcl 500 Mg Xr24h-tab (Metformin hcl) .Marland Kitchen..Marland Kitchen Two tabs by mouth two times a day Patient advised to reduce carbs and sweets, commit to regular physical activity, take meds as prescribed, test blood sugars as directed, and  attempt to lose weight , to improve blood sugar control.  Labs Reviewed: Creat: 0.84 (01/31/2010)    Reviewed HgBA1c results: 6.6 (01/31/2010)  6.5 (09/18/2009)  Problem # 3:  HYPERLIPIDEMIA (ICD-272.4) Assessment: Comment Only  Her updated medication list for this problem includes:    Lovastatin 40 Mg Tabs (Lovastatin) .Marland Kitchen... Take 1 tab by mouth at bedtime Low fat dietdiscussed and encouraged  Labs Reviewed: SGOT: 11 (01/31/2010)   SGPT:  11 (01/31/2010)   HDL:42 (01/31/2010), 43 (09/18/2009)  LDL:74 (01/31/2010), 77 (09/18/2009)  Chol:157 (01/31/2010), 158 (09/18/2009)  Trig:207 (01/31/2010), 189 (09/18/2009)  Problem # 4:  HYPERTENSION (ICD-401.9) Assessment: Unchanged  The following medications were removed from the medication list:    Lotensin 20 Mg Tabs (Benazepril hcl) .Marland Kitchen... Take 1 tablet by mouth once a day, possible ace allergy Her updated medication list for this problem includes:    Ziac 10-6.25 Mg Tabs (Bisoprolol-hydrochlorothiazide) .Marland Kitchen... Take 1 tablet by mouth once a day    Amlodipine Besylate 2.5 Mg Tabs (Amlodipine besylate) .Marland Kitchen... Take 1 tablet by mouth once a day , stop benazepril  BP today: 120/80 Prior BP: 122/70 (02/07/2010)  Labs Reviewed: K+: 4.5 (01/31/2010) Creat: : 0.84 (01/31/2010)   Chol: 157 (01/31/2010)   HDL: 42 (01/31/2010)   LDL: 74 (01/31/2010)   TG: 207 (01/31/2010)  Complete Medication List: 1)  Metformin Hcl 500 Mg Xr24h-tab (Metformin hcl) .... Two tabs by mouth two times a day 2)  Freestyle Lite Test Strp (Glucose blood) .... Up to four times daily 3)  Lovastatin 40 Mg Tabs (Lovastatin) .... Take 1 tab by mouth at bedtime 4)  Ziac 10-6.25 Mg Tabs (Bisoprolol-hydrochlorothiazide) .... Take 1 tablet by mouth once a day 5)  Super Calcium/d 600-125 Mg-unit Tabs (Calcium-vitamin d) .... One tab by mouth two times a day 6)  Amlodipine Besylate 2.5 Mg Tabs (Amlodipine besylate) .... Take 1 tablet by mouth once a day , stop benazepril 7)  Penicillin V Potassium 500 Mg Tabs (Penicillin v potassium) .... Take 1 tablet by mouth three times a day 8)  Tessalon Perles 100 Mg Caps (Benzonatate) .... Take 1 capsule by mouth three times a day  Patient Instructions: 1)  f/u as before. 2)  nURSE bP check in 1 month 3)  you are being treated for pharyngitis. 4)  Meds are sent to your pharmacy. 5)  pLS stop benazepril, you may be allergic to this. 6)  new med for your blood pressure is amlodipine  2.5 mg one daily Prescriptions: ZIAC 10-6.25 MG TABS (BISOPROLOL-HYDROCHLOROTHIAZIDE) Take 1 tablet by mouth once a day  #90 x 1   Entered by:   Adella Hare LPN   Authorized by:   Syliva Overman MD   Signed by:   Adella Hare LPN on 16/11/9602   Method used:   Electronically to        [facility] (retail)       [address]       [place], [place]  [address]       Ph: [phone] or [phone]       Fax: [phone]   RxID:   [id number] LOVASTATIN 40 MG TABS (LOVASTATIN) Take 1 tab by mouth at bedtime  #90 x 1   Entered by:   Adella Hare LPN   Authorized by:   Syliva Overman MD   Signed by:   Adella Hare LPN on 40/34/7425   Method used:   Electronically to  [facility]* (retail)       [address]       [place], [place]  [address]       Ph: [phone] or [phone]       Fax: [phone]   RxID:   [id number] TESSALON PERLES 100 MG CAPS (BENZONATATE) Take 1 capsule by mouth three times a day  #30 x 0   Entered and Authorized by:   Syliva Overman MD   Signed by:   Syliva Overman MD on 03/07/2010   Method used:   Electronically to        [facility] (retail)       [address]       [place], [place]  [address]       Ph: [phone] or [phone]       Fax: [phone]   RxID:   [id number] PENICILLIN V POTASSIUM 500 MG TABS (PENICILLIN V POTASSIUM) Take 1 tablet by mouth three times a day  #30 x 0   Entered and Authorized by:   Syliva Overman MD   Signed by:   Syliva Overman MD on 03/07/2010   Method used:   Electronically to        [facility] (retail)       [address]       [place], [place]  [address]       Ph: [phone] or [phone]       Fax: [phone]   RxID:   [id number] AMLODIPINE BESYLATE 2.5 MG TABS (AMLODIPINE BESYLATE) Take 1 tablet by mouth once a day , stop benazepril  #30 x 3    Entered and Authorized by:   Syliva Overman MD   Signed by:   Syliva Overman MD on 03/07/2010   Method used:   Printed then faxed to ...       [facility]* (retail)       [address]       [place], [place]  [address]       Ph: [phone] or [phone]       Fax: [phone]   RxID:   [id number]    Orders Added: 1)  Est. Patient Level IV [98338]

## 2010-06-05 ENCOUNTER — Encounter: Payer: Self-pay | Admitting: Family Medicine

## 2010-06-05 ENCOUNTER — Ambulatory Visit (INDEPENDENT_AMBULATORY_CARE_PROVIDER_SITE_OTHER): Payer: BLUE CROSS/BLUE SHIELD | Admitting: Family Medicine

## 2010-06-05 VITALS — BP 160/84 | HR 97 | Resp 16 | Ht 69.0 in | Wt 280.1 lb

## 2010-06-05 DIAGNOSIS — E785 Hyperlipidemia, unspecified: Secondary | ICD-10-CM

## 2010-06-05 DIAGNOSIS — E119 Type 2 diabetes mellitus without complications: Secondary | ICD-10-CM

## 2010-06-05 DIAGNOSIS — Z23 Encounter for immunization: Secondary | ICD-10-CM

## 2010-06-05 DIAGNOSIS — L237 Allergic contact dermatitis due to plants, except food: Secondary | ICD-10-CM

## 2010-06-05 DIAGNOSIS — L255 Unspecified contact dermatitis due to plants, except food: Secondary | ICD-10-CM

## 2010-06-05 DIAGNOSIS — I10 Essential (primary) hypertension: Secondary | ICD-10-CM

## 2010-06-05 LAB — HEMOGLOBIN A1C: Mean Plasma Glucose: 148 mg/dL — ABNORMAL HIGH (ref <117)

## 2010-06-05 MED ORDER — METFORMIN HCL 1000 MG PO TABS: 1000.0000 mg | ORAL_TABLET | Freq: Two times a day (BID) | ORAL | Status: DC

## 2010-06-05 MED ORDER — PREDNISONE (PAK) 5 MG PO TABS: 5.0000 mg | ORAL_TABLET | ORAL | Status: DC

## 2010-06-05 MED ORDER — LOVASTATIN 40 MG PO TABS: 40.0000 mg | ORAL_TABLET | Freq: Every day | ORAL | Status: DC

## 2010-06-05 MED ORDER — BISOPROLOL-HYDROCHLOROTHIAZIDE 10-6.25 MG PO TABS: 1.0000 | ORAL_TABLET | Freq: Every day | ORAL | Status: DC

## 2010-06-05 MED ORDER — ZOSTER VACCINE LIVE 19400 UNT/0.65ML ~~LOC~~ SOLR: 0.6500 mL | Freq: Once | SUBCUTANEOUS | Status: DC

## 2010-06-05 MED ORDER — AMLODIPINE BESYLATE 5 MG PO TABS: 5.0000 mg | ORAL_TABLET | Freq: Every day | ORAL | Status: DC

## 2010-06-05 NOTE — Progress Notes (Signed)
  Subjective:    Patient ID: Theresa Adkins, female    DOB: 02/09/1946, 65 y.o.   MRN: [id number]  HPI The PT is here for follow up and re-evaluation of chronic medical conditions, medication management and review of recent lab and radiology data.She had labs done at her workplace recently and these will be scanned into her chart.  Preventive health is updated, specifically  Cancer screening, Osteoporosis screening and Immunization.   Questions or concerns regarding consultations or procedures which the PT has had in the interim are  addressed. The PT denies any adverse reactions to current medications since the last visit.  One week ao she was exposed to poison oak, has a rash which is drying up on her right forearm and now new ones under her left breast and right forearm which are new. She is agreeing 2 of the 3 vaccines due today and will sched her  Overdue mammogram       Review of Systems Denies recent fever or chills. Denies sinus pressure, nasal congestion, ear pain or sore throat. Denies chest congestion, productive cough or wheezing. Denies chest pains, palpitations, paroxysmal nocturnal dyspnea, orthopnea and leg swelling Denies abdominal pain, nausea, vomiting,diarrhea or constipation.  Denies rectal bleeding or change in bowel movement. Denies dysuria, frequency, hesitancy or incontinence. Denies joint pain, swelling and limitation and mobility. Denies headaches, seizure, numbness, or tingling. Denies depression, anxiety or insomnia. Blood sugars are checked on average 3 times weekly, and her fastings are around 120      Objective:   Physical Exam Patient alert and oriented and in no Cardiopulmonary distress.  HEENT: No facial asymmetry, EOMI, no sinus tenderness, TM's clear, Oropharynx pink and moist.  Neck supple no adenopathy.  Chest: Clear to auscultation bilaterally.  CVS: S1, S2 no murmurs, no S3.  ABD: Soft non tender. Bowel sounds normal.  Ext: No  edema  MS: Adequate ROM spine, shoulders, hips and knees.  Skin: Intact, no ulcerations or rash noted. Erythematous rash in streaks on right forearm and left arm and under left breast. No purulent drainage Psych: Good eye contact, normal affect. Memory intact not anxious or depressed appearing.  CNS: CN 2-12 intact, power, tone and sensation normal throughout.        Assessment & Plan:  1. Hypertension: uncontrolled, dose increase of amlodipine 2. Type 2 DM : HBa1C today 3. Hyperlipidemia: sub optimal control, no med change at this time, closer attention to diet and exercise 4. Poison oak: prednisone dose pack prescribed, and avidance stressed 5.Obesity : lifestle change encouraged and discussed.

## 2010-06-05 NOTE — Patient Instructions (Signed)
F/u in4 months. It is important that you exercise regularly at least 30 minutes 5 times a week. If you develop chest pain, have severe difficulty breathing, or feel very tired, stop exercising immediately and seek medical attention  A healthy diet is rich in fruit, vegetables and whole grains. Poultry fish, nuts and beans are a healthy choice for protein rather then red meat. A low sodium diet and drinking 64 ounces of water daily is generally recommended. Oils and sweet should be limited. Carbohydrates especially for those who are diabetic or overweight, should be limited to 34-45 gram per meal. It is important to eat on a regular schedule, at least 3 times daily. Snacks should be primarily fruits, vegetables or nuts. HBA1C today.  hBA1C fasting lipid , hepatic and chem 7 in 4 months  Prednisone dose pack sent in for poison oak. Higher dose of amlodipine for your blood pressure which is too high.  Pls reduce fried and fatty foods, your triglycerides are high  You need to lose weight.  Pls schedule your mammogram it is overdue.  TDaP and zostavax today

## 2010-06-08 ENCOUNTER — Telehealth: Payer: Self-pay | Admitting: Family Medicine

## 2010-06-11 ENCOUNTER — Encounter: Payer: Self-pay | Admitting: Family Medicine

## 2010-07-03 NOTE — Op Note (Signed)
Theresa Adkins, Theresa Adkins                ACCOUNT NO.:  [id number]   MEDICAL RECORD NO.:  [id number]          Adkins TYPE:  AMB   LOCATION:  DAY                           FACILITY:  [facility]   PHYSICIAN:  Kassie Mends, M.D.      DATE OF BIRTH:  1945-05-01   DATE OF PROCEDURE:  03/04/2008  DATE OF DISCHARGE:                                PROCEDURE NOTE   REFERRING PHYSICIAN:  Milus Mallick. Lodema Hong, MD   PROCEDURE:  Colonoscopy with cold forceps, polypectomy and random cold  forceps biopsy.   INDICATION FOR EXAMINATION:  Theresa Adkins is a 65 year old female with  diabetes.  She has been taking metformin 2 grams b.i.d. and complaints  of diarrhea daily.   FINDINGS:  1. Scattered diverticula throughout Theresa colon most pronounced in Theresa      sigmoid colon.  2. Polypoid appearing lesion in Theresa transverse colon removed via cold      forceps.  Random biopsies were obtained via cold forceps to      evaluate for microscopic colitis as an etiology for daily diarrhea.      Otherwise, no masses, inflammatory changes, or AVM seen.  3. Moderate internal hemorrhoids.  Otherwise, normal retroflex view of      Theresa rectum.   DIAGNOSES:  1. Polypoid lesion in Theresa transverse colon.  2. Mild diverticulosis.  3. Moderate internal hemorrhoids.  4. No source for diarrhea identified. Diarrhea is likely secondary to      diabetic enteropathy and/or metformin.  She may also have some      lactose intolerance, celiac sprue, or IBS-diarrhea predmoniant.   RECOMMENDATIONS:  1. Will call Theresa Adkins with Theresa results of her biopsies.  2. No aspirin, NSAIDs, or anticoagulation for 7 days.  3. She should follow a high-fiber diet.  She is given a handout on      high-fiber diet, polyps, diverticulosis, and hemorrhoids.   MEDICATIONS:  1. Demerol 75 mg IV.  2. Versed 4 mg IV.   PROCEDURE TECHNIQUE:  Physical exam was performed.  Informed consent was  obtained from Theresa Adkins after explaining Theresa benefits, risks, and  alternatives to Theresa procedure.  Theresa Adkins was connected to Theresa monitor  and placed in left lateral position.  Continuous oxygen was provided by  nasal cannula and IV medicine administered through an indwelling  cannula.  After administration of sedation and rectal exam, Theresa  Adkins's rectum was intubated and Theresa scope was advanced under direct  visualization to Theresa cecum.  Theresa scope was removed slowly by carefully  examining Theresa color, texture, anatomy, and integrity of Theresa mucosa on  Theresa way out.  Theresa Adkins was recovered in endoscopy and discharged home  in satisfactory condition.   PATH [address]:  Simple adenoma. TCS in 10 years. Normal colonic mucosa.  Could consider holding metformin to if diarrhea resolves. Follow up with  [facility] if diarrhea persists.      Kassie Mends, M.D.  Electronically Signed     SM/MEDQ  D:  03/04/2008  T:  03/05/2008  Job:  [id number]   cc:  Norwood Levo. Moshe Cipro, M.D.  Fax: [phone]

## 2010-08-01 ENCOUNTER — Telehealth: Payer: Self-pay | Admitting: Family Medicine

## 2010-08-01 NOTE — Telephone Encounter (Signed)
pls advise if swelling in the afternoon and go down overnight, fairly common esp in the Summer months whenm hot. Ensure she cuts back on salt intake. Elevate legs when able   since not swollen in the morning no need for extra medication aat this time

## 2010-08-02 NOTE — Telephone Encounter (Signed)
Patient aware.

## 2010-09-27 ENCOUNTER — Other Ambulatory Visit: Payer: Self-pay | Admitting: Family Medicine

## 2010-09-27 DIAGNOSIS — Z139 Encounter for screening, unspecified: Secondary | ICD-10-CM

## 2010-10-05 ENCOUNTER — Ambulatory Visit (HOSPITAL_COMMUNITY)
Admission: RE | Admit: 2010-10-05 | Discharge: 2010-10-05 | Disposition: A | Discharge status: Home / Self Care | Payer: Medicare Other | Source: Ambulatory Visit | Attending: Family Medicine | Admitting: Family Medicine

## 2010-10-05 ENCOUNTER — Telehealth: Payer: Self-pay | Admitting: Family Medicine

## 2010-10-05 DIAGNOSIS — E785 Hyperlipidemia, unspecified: Secondary | ICD-10-CM

## 2010-10-05 DIAGNOSIS — Z1231 Encounter for screening mammogram for malignant neoplasm of breast: Secondary | ICD-10-CM | POA: Insufficient documentation

## 2010-10-05 DIAGNOSIS — R5383 Other fatigue: Secondary | ICD-10-CM

## 2010-10-05 DIAGNOSIS — Z139 Encounter for screening, unspecified: Secondary | ICD-10-CM

## 2010-10-05 DIAGNOSIS — E119 Type 2 diabetes mellitus without complications: Secondary | ICD-10-CM

## 2010-10-05 DIAGNOSIS — I10 Essential (primary) hypertension: Secondary | ICD-10-CM

## 2010-10-05 NOTE — Telephone Encounter (Signed)
pls order and let her know cmp and egfr, HBA1C , cbc and tsh and lipids

## 2010-10-08 ENCOUNTER — Other Ambulatory Visit: Payer: Self-pay | Admitting: *Deleted

## 2010-10-08 DIAGNOSIS — E559 Vitamin D deficiency, unspecified: Secondary | ICD-10-CM

## 2010-10-08 NOTE — Telephone Encounter (Signed)
Labs ordered and faxed, called patient, left message

## 2010-10-09 LAB — LIPID PANEL
Cholesterol: 187 mg/dL (ref 0–200)
Triglycerides: 299 mg/dL — ABNORMAL HIGH (ref <150)
VLDL: 60 mg/dL — ABNORMAL HIGH (ref 0–40)

## 2010-10-09 LAB — HEPATIC FUNCTION PANEL
ALT: 18 U/L (ref 0–35)
AST: 22 U/L (ref 0–37)
Alkaline Phosphatase: 50 U/L (ref 39–117)
Indirect Bilirubin: 0.3 mg/dL (ref 0.0–0.9)
Total Protein: 7.3 g/dL (ref 6.0–8.3)

## 2010-10-09 LAB — HEMOGLOBIN A1C: Hgb A1c MFr Bld: 7 % — ABNORMAL HIGH (ref <5.7)

## 2010-10-09 LAB — BASIC METABOLIC PANEL
BUN: 9 mg/dL (ref 6–23)
Calcium: 10 mg/dL (ref 8.4–10.5)
Creat: 0.73 mg/dL (ref 0.50–1.10)

## 2010-10-11 ENCOUNTER — Encounter: Payer: Self-pay | Admitting: Family Medicine

## 2010-10-12 ENCOUNTER — Encounter: Payer: Self-pay | Admitting: Family Medicine

## 2010-10-12 ENCOUNTER — Ambulatory Visit (INDEPENDENT_AMBULATORY_CARE_PROVIDER_SITE_OTHER): Payer: Medicare Other | Admitting: Family Medicine

## 2010-10-12 VITALS — BP 130/82 | HR 83 | Resp 16 | Ht 69.0 in | Wt 285.4 lb

## 2010-10-12 DIAGNOSIS — E559 Vitamin D deficiency, unspecified: Secondary | ICD-10-CM

## 2010-10-12 DIAGNOSIS — L259 Unspecified contact dermatitis, unspecified cause: Secondary | ICD-10-CM

## 2010-10-12 DIAGNOSIS — I10 Essential (primary) hypertension: Secondary | ICD-10-CM

## 2010-10-12 DIAGNOSIS — E119 Type 2 diabetes mellitus without complications: Secondary | ICD-10-CM

## 2010-10-12 DIAGNOSIS — E669 Obesity, unspecified: Secondary | ICD-10-CM

## 2010-10-12 DIAGNOSIS — E785 Hyperlipidemia, unspecified: Secondary | ICD-10-CM

## 2010-10-12 MED ORDER — TERBINAFINE HCL 250 MG PO TABS: 250.0000 mg | ORAL_TABLET | Freq: Every day | ORAL | Status: DC

## 2010-10-12 MED ORDER — METFORMIN HCL 1000 MG PO TABS: 1000.0000 mg | ORAL_TABLET | Freq: Two times a day (BID) | ORAL | Status: DC

## 2010-10-12 MED ORDER — CEPHALEXIN 500 MG PO CAPS: 500.0000 mg | ORAL_CAPSULE | Freq: Two times a day (BID) | ORAL | Status: AC

## 2010-10-12 MED ORDER — BISOPROLOL-HYDROCHLOROTHIAZIDE 10-6.25 MG PO TABS: 1.0000 | ORAL_TABLET | Freq: Every day | ORAL | Status: DC

## 2010-10-12 NOTE — Patient Instructions (Signed)
F/U  In 4 months,  Medication is sent in for the infection on the left heel, antifungal   And an antibiotic  It is important that you exercise regularly at least 30 minutes 5 times a week. If you develop chest pain, have severe difficulty breathing, or feel very tired, stop exercising immediately and seek medical attention    A healthy diet is rich in fruit, vegetables and whole grains. Poultry fish, nuts and beans are a healthy choice for protein rather then red meat. A low sodium diet and drinking 64 ounces of water daily is generally recommended. Oils and sweet should be limited. Carbohydrates especially for those who are diabetic or overweight, should be limited to 30-45 gram per meal. It is important to eat on a regular schedule, at least 3 times daily. Snacks should be primarily fruits, vegetables or nuts.  Weight loss goal is 10 pounds   LABWORK  NEEDS TO BE DONE BETWEEN 3 TO 7 DAYS BEFORE YOUR NEXT SCEDULED  VISIT.  THIS WILL IMPROVE THE QUALITY OF YOUR CARE.

## 2010-10-13 NOTE — Assessment & Plan Note (Signed)
Deteriorated. Patient re-educated about  the importance of commitment to a  minimum of 150 minutes of exercise per week. The importance of healthy food choices with portion control discussed. Encouraged to start a food diary, count calories and to consider  joining a support group. Sample diet sheets offered. Goals set by the patient for the next several months.    

## 2010-10-13 NOTE — Assessment & Plan Note (Signed)
Controlled, no change in medication  

## 2010-10-13 NOTE — Assessment & Plan Note (Signed)
Deteriorated, antibiotic and antifungal prescribed

## 2010-10-13 NOTE — Assessment & Plan Note (Signed)
Less well controlled, no med change , but increased exercise and  ddietary change needed

## 2010-10-13 NOTE — Progress Notes (Signed)
  Subjective:    Patient ID: Theresa Adkins, female    DOB: 1945-11-28, 65 y.o.   MRN: [id number]  HPI The PT is here for follow up and re-evaluation of chronic medical conditions, medication management and review of any available recent lab and radiology data.  Preventive health is updated, specifically  Cancer screening and Immunization.   Questions or concerns regarding consultations or procedures which the PT has had in the interim are  addressed. The PT denies any adverse reactions to current medications since the last visit.  There are no new concerns.  C/o increased skin breakdown and rash on the left foot Blood sugars range between 110 to 130 fasting. No regular exercise and no attention to dietary change     Review of Systems Denies recent fever or chills. Denies sinus pressure, nasal congestion, ear pain or sore throat. Denies chest congestion, productive cough or wheezing. Denies chest pains, palpitations and leg swelling Denies abdominal pain, nausea, vomiting,diarrhea or constipation.   Denies dysuria, frequency, hesitancy or incontinence. Denies joint pain, swelling and limitation in mobility. Denies headaches, seizures, numbness, or tingling. Denies depression, anxiety or insomnia.       Objective:   Physical Exam Patient alert and oriented and in no cardiopulmonary distress.  HEENT: No facial asymmetry, EOMI, no sinus tenderness,  oropharynx pink and moist.  Neck supple no adenopathy.  Chest: Clear to auscultation bilaterally.  CVS: S1, S2 no murmurs, no S3.  ABD: Soft non tender. Bowel sounds normal.  Ext: No edema  MS: Adequate ROM spine, shoulders, hips and knees.  Skin:rash on plantar aspect of left foot, no purulent drainage  Psych: Good eye contact, normal affect. Memory intact not anxious or depressed appearing.  CNS: CN 2-12 intact, power, tone and sensation normal throughout.  Diabetic Foot Check:  Appearance - cracking, caluses, and  ulceration. Tinea pedis and onychomycosis Skin - no unusual pallor or redness Sensation - grossly intact to light touch Monofilament testing -  Right - Great toe, medial, central, lateral ball and posterior foot diminished Left - Great toe, medial, central, lateral ball and posterior foot  diminished Pulses Left - Dorsalis Pedis and Posterior Tibia normal Right - Dorsalis Pedis and Posterior Tibia normal          Assessment & Plan:

## 2010-12-11 ENCOUNTER — Other Ambulatory Visit: Payer: Self-pay | Admitting: Family Medicine

## 2011-02-16 LAB — LIPID PANEL
Cholesterol: 163 mg/dL (ref 0–200)
HDL: 44 mg/dL
LDL Cholesterol: 50 mg/dL (ref 0–99)
Total CHOL/HDL Ratio: 3.7 ratio
Triglycerides: 343 mg/dL — ABNORMAL HIGH
VLDL: 69 mg/dL — ABNORMAL HIGH (ref 0–40)

## 2011-02-16 LAB — COMPLETE METABOLIC PANEL WITH GFR
ALT: 16 U/L (ref 0–35)
AST: 15 U/L (ref 0–37)
Creat: 0.73 mg/dL (ref 0.50–1.10)
Sodium: 139 mEq/L (ref 135–145)
Total Bilirubin: 0.3 mg/dL (ref 0.3–1.2)
Total Protein: 7.1 g/dL (ref 6.0–8.3)

## 2011-02-16 LAB — HEMOGLOBIN A1C: Mean Plasma Glucose: 146 mg/dL — ABNORMAL HIGH (ref <117)

## 2011-02-16 LAB — VITAMIN D 25 HYDROXY (VIT D DEFICIENCY, FRACTURES): Vit D, 25-Hydroxy: 35 ng/mL (ref 30–89)

## 2011-02-20 ENCOUNTER — Encounter: Payer: Self-pay | Admitting: Family Medicine

## 2011-02-21 ENCOUNTER — Encounter: Payer: Self-pay | Admitting: Family Medicine

## 2011-02-21 ENCOUNTER — Ambulatory Visit (INDEPENDENT_AMBULATORY_CARE_PROVIDER_SITE_OTHER): Payer: Medicare Other | Admitting: Family Medicine

## 2011-02-21 DIAGNOSIS — R5383 Other fatigue: Secondary | ICD-10-CM

## 2011-02-21 DIAGNOSIS — E785 Hyperlipidemia, unspecified: Secondary | ICD-10-CM

## 2011-02-21 DIAGNOSIS — L259 Unspecified contact dermatitis, unspecified cause: Secondary | ICD-10-CM

## 2011-02-21 DIAGNOSIS — E669 Obesity, unspecified: Secondary | ICD-10-CM

## 2011-02-21 DIAGNOSIS — I10 Essential (primary) hypertension: Secondary | ICD-10-CM

## 2011-02-21 DIAGNOSIS — E119 Type 2 diabetes mellitus without complications: Secondary | ICD-10-CM

## 2011-02-21 MED ORDER — BISOPROLOL-HYDROCHLOROTHIAZIDE 10-6.25 MG PO TABS: 1.0000 | ORAL_TABLET | Freq: Every day | ORAL | Status: DC

## 2011-02-21 MED ORDER — LOVASTATIN 40 MG PO TABS: 40.0000 mg | ORAL_TABLET | Freq: Every day | ORAL | Status: DC

## 2011-02-21 MED ORDER — METFORMIN HCL 1000 MG PO TABS: 1000.0000 mg | ORAL_TABLET | Freq: Two times a day (BID) | ORAL | Status: DC

## 2011-02-21 MED ORDER — TERBINAFINE HCL 250 MG PO TABS: 250.0000 mg | ORAL_TABLET | Freq: Every day | ORAL | Status: DC

## 2011-02-21 MED ORDER — AMLODIPINE BESYLATE 5 MG PO TABS: 5.0000 mg | ORAL_TABLET | Freq: Every day | ORAL | Status: DC

## 2011-02-21 NOTE — Patient Instructions (Signed)
CPE in 4 months.  Please change your eating habits as discussed, this will lower your triglycerides and your weight  Fasting labs including urine in 4 months before the next visit.  Weight loss goal of at least 2.5 pounds per month  Pls consider starting water aerobics for improved health  Lamisil will be prescribed for your fungal infection of the feet

## 2011-02-23 NOTE — Assessment & Plan Note (Signed)
Controlled, no change in medication  

## 2011-02-23 NOTE — Progress Notes (Signed)
Subjective:     Patient ID: Theresa Adkins, female   DOB: 02-16-1946, 66 y.o.   MRN: [id number]  HPI The PT is here for follow up and re-evaluation of chronic medical conditions, medication management and review of any available recent lab and radiology data.  Preventive health is updated, specifically  Cancer screening and Immunization.   Questions or concerns regarding consultations or procedures which the PT has had in the interim are  addressed. The PT denies any adverse reactions to current medications since the last visit.  Concerned about failure to lose weight, plans to address this more consistently in the New year. C/o increased flaking and rash on foot, also fungal toenail infection, has responded to oral med in the past , and is requesting same Denies polyuria , polydipsia or blurred vision, no hypoglycemic episodes. Blood sugars average 120 fasting     Review of Systems See HPI Denies recent fever or chills. Denies sinus pressure, nasal congestion, ear pain or sore throat. Denies chest congestion, productive cough or wheezing. Denies chest pains, palpitations and leg swelling Denies abdominal pain, nausea, vomiting,diarrhea or constipation.   Denies dysuria, frequency, hesitancy or incontinence. Denies joint pain, swelling and limitation in mobility. Denies headaches, seizures, numbness, or tingling. Denies depression, anxiety or insomnia.      Objective:   Physical Exam Patient alert and oriented and in no cardiopulmonary distress.  HEENT: No facial asymmetry, EOMI, no sinus tenderness,  oropharynx pink and moist.  Neck supple no adenopathy.  Chest: Clear to auscultation bilaterally.  CVS: S1, S2 no murmurs, no S3.  ABD: Soft non tender. Bowel sounds normal.  Ext: No edema  MS: Adequate ROM spine, shoulders, hips and knees.  Skin:tinea pedis and onychomycosis  Psych: Good eye contact, normal affect. Memory intact not anxious or depressed appearing.  CNS:  CN 2-12 intact, power, tone and sensation normal throughout.  Diabetic Foot Check:  Appearance -  calluses Skin - no unusual pallor or redness Sensation - grossly intact to light touch Monofilament testing -  Right - Great toe, medial, central, lateral ball and posterior foot diminished Left - Great toe, medial, central, lateral ball and posterior foot diminished Pulses Left - Dorsalis Pedis and Posterior Tibia normal Right - Dorsalis Pedis and Posterior Tibia normal      Assessment:         Plan:

## 2011-02-23 NOTE — Assessment & Plan Note (Signed)
Deteriorated. Patient re-educated about  the importance of commitment to a  minimum of 150 minutes of exercise per week. The importance of healthy food choices with portion control discussed. Encouraged to start a food diary, count calories and to consider  joining a support group. Sample diet sheets offered. Goals set by the patient for the next several months.    

## 2011-02-23 NOTE — Assessment & Plan Note (Signed)
Triglycerides elevated, lo fat diet discussed and encouraged

## 2011-02-23 NOTE — Assessment & Plan Note (Signed)
Deteriorated, 3 month supply of terbinafine

## 2011-05-04 ENCOUNTER — Other Ambulatory Visit: Payer: Self-pay | Admitting: Family Medicine

## 2011-06-20 LAB — LIPID PANEL
Cholesterol: 184 mg/dL (ref 0–200)
HDL: 47 mg/dL (ref >39)
Triglycerides: 299 mg/dL — ABNORMAL HIGH (ref <150)

## 2011-06-20 LAB — COMPLETE METABOLIC PANEL WITH GFR
BUN: 10 mg/dL (ref 6–23)
CO2: 28 mEq/L (ref 19–32)
Creat: 0.69 mg/dL (ref 0.50–1.10)
GFR, Est African American: >89 mL/min
GFR, Est Non African American: >89 mL/min
Glucose, Bld: 157 mg/dL — ABNORMAL HIGH (ref 70–99)
Total Bilirubin: 0.3 mg/dL (ref 0.3–1.2)
Total Protein: 7.2 g/dL (ref 6.0–8.3)

## 2011-06-20 LAB — MICROALBUMIN / CREATININE URINE RATIO: Microalb, Ur: 9.16 mg/dL — ABNORMAL HIGH (ref 0.00–1.89)

## 2011-06-20 LAB — HEMOGLOBIN A1C
Hgb A1c MFr Bld: 6.4 % — ABNORMAL HIGH (ref <5.7)
Mean Plasma Glucose: 137 mg/dL — ABNORMAL HIGH (ref <117)

## 2011-06-24 ENCOUNTER — Encounter: Payer: Self-pay | Admitting: Family Medicine

## 2011-06-24 ENCOUNTER — Other Ambulatory Visit (HOSPITAL_COMMUNITY)
Admission: RE | Admit: 2011-06-24 | Discharge: 2011-06-24 | Disposition: A | Discharge status: Home / Self Care | Payer: Medicare Other | Source: Ambulatory Visit | Attending: Family Medicine | Admitting: Family Medicine

## 2011-06-24 ENCOUNTER — Ambulatory Visit (INDEPENDENT_AMBULATORY_CARE_PROVIDER_SITE_OTHER): Payer: Medicare Other | Admitting: Family Medicine

## 2011-06-24 VITALS — BP 136/78 | HR 87 | Resp 18 | Ht 69.0 in | Wt 290.0 lb

## 2011-06-24 DIAGNOSIS — Z01419 Encounter for gynecological examination (general) (routine) without abnormal findings: Secondary | ICD-10-CM | POA: Insufficient documentation

## 2011-06-24 DIAGNOSIS — E785 Hyperlipidemia, unspecified: Secondary | ICD-10-CM

## 2011-06-24 DIAGNOSIS — Z0000 Encounter for general adult medical examination without abnormal findings: Secondary | ICD-10-CM

## 2011-06-24 DIAGNOSIS — I10 Essential (primary) hypertension: Secondary | ICD-10-CM

## 2011-06-24 DIAGNOSIS — R5381 Other malaise: Secondary | ICD-10-CM

## 2011-06-24 DIAGNOSIS — B353 Tinea pedis: Secondary | ICD-10-CM

## 2011-06-24 DIAGNOSIS — R9431 Abnormal electrocardiogram [ECG] [EKG]: Secondary | ICD-10-CM | POA: Insufficient documentation

## 2011-06-24 DIAGNOSIS — L732 Hidradenitis suppurativa: Secondary | ICD-10-CM

## 2011-06-24 DIAGNOSIS — Z124 Encounter for screening for malignant neoplasm of cervix: Secondary | ICD-10-CM

## 2011-06-24 DIAGNOSIS — Z1211 Encounter for screening for malignant neoplasm of colon: Secondary | ICD-10-CM

## 2011-06-24 DIAGNOSIS — E119 Type 2 diabetes mellitus without complications: Secondary | ICD-10-CM

## 2011-06-24 DIAGNOSIS — E669 Obesity, unspecified: Secondary | ICD-10-CM

## 2011-06-24 DIAGNOSIS — E559 Vitamin D deficiency, unspecified: Secondary | ICD-10-CM

## 2011-06-24 LAB — POC HEMOCCULT BLD/STL (OFFICE/1-CARD/DIAGNOSTIC): Fecal Occult Blood, POC: NEGATIVE

## 2011-06-24 MED ORDER — DOXYCYCLINE HYCLATE 100 MG PO CAPS: 100.0000 mg | ORAL_CAPSULE | Freq: Two times a day (BID) | ORAL | Status: AC

## 2011-06-24 MED ORDER — TERBINAFINE HCL 1 % EX CREA: TOPICAL_CREAM | Freq: Two times a day (BID) | CUTANEOUS | Status: AC

## 2011-06-24 NOTE — Assessment & Plan Note (Signed)
Deteriorated. Patient re-educated about  the importance of commitment to a  minimum of 150 minutes of exercise per week. The importance of healthy food choices with portion control discussed. Encouraged to start a food diary, count calories and to consider  joining a support group. Sample diet sheets offered. Goals set by the patient for the next several months.    

## 2011-06-24 NOTE — Assessment & Plan Note (Signed)
Ulcer in left groin

## 2011-06-24 NOTE — Assessment & Plan Note (Signed)
Improved but uncontrolled

## 2011-06-24 NOTE — Progress Notes (Signed)
Addended by: Abner Greenspan on: 06/24/2011 01:05 PM   Modules accepted: Orders

## 2011-06-24 NOTE — Patient Instructions (Addendum)
F/u in 3.5 month, please call if you need me before  Please cut back on fried and fatty foods  I will further review your EKG with cardiology and get back in touch with you if you need formal cardiology evaluation.  It is important that you exercise regularly at least 30 minutes 5 times a week. If you develop chest pain, have severe difficulty breathing, or feel very tired, stop exercising immediately and seek medical attention   Weight loss goal of 3 to 4 pounds per month is realistic and necessary for improved health   Med is sent in for the fungal infection on the back of the left heel, and also for the ulcer in your left groin  Fasting lipid, cmp and eGFR and hBA1C in 3.5 month, also cbc and TSH  Please provide evidence of your pneumonia vaccine

## 2011-06-24 NOTE — Assessment & Plan Note (Signed)
Controlled, no change in medication  

## 2011-06-24 NOTE — Assessment & Plan Note (Signed)
Improved no med change ?

## 2011-06-24 NOTE — Progress Notes (Signed)
  Subjective:    Patient ID: Theresa Adkins, female    DOB: 02/11/1946, 66 y.o.   MRN: [id number]  HPI .The PT is here for complete exam  and re-evaluation of chronic medical conditions, medication management and review of any available recent lab and radiology data.  Preventive health is updated, specifically  Cancer screening and Immunization.   Questions or concerns regarding consultations or procedures which the PT has had in the interim are  addressed. The PT denies any adverse reactions to current medications since the last visit.  There are no new concerns.  C/o open sore in left groin for weeks    Review of Systems See HPI]Denies recent fever or chills. Denies sinus pressure, nasal congestion, ear pain or sore throat. Denies chest congestion, productive cough or wheezing. Denies chest pains, palpitations and leg swelling Denies abdominal pain, nausea, vomiting,diarrhea or constipation.   Denies dysuria, frequency, hesitancy or incontinence. Chronic back painand limitation in mobility. Denies headaches, seizures, numbness, or tingling. Denies depression, anxiety or insomnia. .    .[email or web address]    Objective:   Physical Exam Pleasant well nourished female, alert and oriented x 3, in no cardio-pulmonary distress. Afebrile. HEENT No facial trauma or asymetry. Sinuses non tender.  EOMI, PERTL, fundoscopic exam is normal, no hemorhage or exudate.  External ears normal, tympanic membranes clear. Oropharynx moist, no exudate, good dentition. Neck: supple, no adenopathy,JVD or thyromegaly.No bruits.  Chest: Clear to ascultation bilaterally.No crackles or wheezes. Non tender to palpation  Breast: No asymetry,no masses. No nipple discharge or inversion. No axillary or supraclavicular adenopathy  Cardiovascular system; Heart sounds normal,  S1 and  S2 ,no S3.  No murmur, or thrill. Apical beat not displaced Peripheral pulses normal.  Abdomen: Soft, non tender, no organomegaly  or masses. No bruits. Bowel sounds normal. No guarding, tenderness or rebound.  Rectal:  No mass. Guaiac negative stool.  GU: External genitalia normal. No lesions. Vaginal canal normal.No discharge. Uterus normal size, no adnexal masses, no cervical motion or adnexal tenderness.  Musculoskeletal exam: Full ROM of spine, hips , shoulders and knees. No deformity ,swelling or crepitus noted. No muscle wasting or atrophy.   Neurologic: Cranial nerves 2 to 12 intact. Power, tone ,sensation and reflexes normal throughout. No disturbance in gait. No tremor.  Skin: Ulcer in left inguinal region, approx length 3inches Pigmentation normal throughout  Psych; Normal mood and affect. Judgement and concentration normal  Diabetic Foot Check:  Appearance - calluses and tinea pedis on left heel Skin - no unusual pallor or redness Sensation - grossly intact to light touch Monofilament testing -  Right - Great toe, medial, central, lateral ball and posterior foot diminishe Left - Great toe, medial, central, lateral ball and posterior foot intact Pulses Left - Dorsalis Pedis and Posterior Tibia normal Right - Dorsalis Pedis and Posterior Tibia normal       Assessment & Plan:

## 2011-07-03 ENCOUNTER — Telehealth: Payer: Self-pay | Admitting: Family Medicine

## 2011-07-03 ENCOUNTER — Other Ambulatory Visit: Payer: Self-pay | Admitting: Family Medicine

## 2011-07-03 DIAGNOSIS — R9431 Abnormal electrocardiogram [ECG] [EKG]: Secondary | ICD-10-CM

## 2011-07-03 NOTE — Telephone Encounter (Signed)
Pls let pt know that I am referring her to the cardiologist  And fax the EKG over and sched appt,itried to call her and had to leave a message for her to call the office, I advised her ask for you on the message

## 2011-07-17 ENCOUNTER — Encounter: Payer: Self-pay | Admitting: Family Medicine

## 2011-07-26 ENCOUNTER — Encounter: Payer: Self-pay | Admitting: Cardiology

## 2011-07-26 ENCOUNTER — Ambulatory Visit (INDEPENDENT_AMBULATORY_CARE_PROVIDER_SITE_OTHER): Payer: Medicare Other | Admitting: Cardiology

## 2011-07-26 VITALS — BP 139/84 | HR 71 | Ht 69.0 in | Wt 279.0 lb

## 2011-07-26 DIAGNOSIS — Z87891 Personal history of nicotine dependence: Secondary | ICD-10-CM

## 2011-07-26 DIAGNOSIS — E119 Type 2 diabetes mellitus without complications: Secondary | ICD-10-CM

## 2011-07-26 DIAGNOSIS — F17201 Nicotine dependence, unspecified, in remission: Secondary | ICD-10-CM | POA: Insufficient documentation

## 2011-07-26 DIAGNOSIS — E785 Hyperlipidemia, unspecified: Secondary | ICD-10-CM

## 2011-07-26 DIAGNOSIS — I10 Essential (primary) hypertension: Secondary | ICD-10-CM

## 2011-07-26 DIAGNOSIS — R9431 Abnormal electrocardiogram [ECG] [EKG]: Secondary | ICD-10-CM

## 2011-07-26 NOTE — Assessment & Plan Note (Signed)
Total cholesterol and LDL are adequately controlled.  Triglycerides remain high, but additional pharmacologic therapy, especially in the absence of known vascular disease, is not warranted.  Improved control of diabetes, weight loss and perhaps fish oil would probably further lower triglyceride levels.

## 2011-07-26 NOTE — Progress Notes (Signed)
Patient ID: Theresa Adkins, female   DOB: Mar 03, 1945, 66 y.o.   MRN: [id number]  HPI: Initial Cardiology evaluation performed at the kind request of Dr. Lodema Hong for evaluation of EKG abnormalities and cardiovascular risk factors.  This nice woman has enjoyed generally excellent health but has been substantially overweight for some time.  This has limited her ability to exercise, and lifestyle is quite sedentary.  She notes occasional mild peripheral edema but denies other cardiopulmonary symptoms.  She has a 30-40-pack-year history of cigarette smoking that was discontinued a decade ago.  She has had mild diabetes well controlled with medical therapy as has been hypertension and hyperlipidemia.  She is attempting to decrease caloric intake and has lost approximately 10 pounds.  She is also attempting to decrease salt intake.  Current Outpatient Prescriptions on File Prior to Visit  Medication Sig Dispense Refill  . amLODipine (NORVASC) 5 MG tablet Take 1 tablet (5 mg total) by mouth daily.  90 tablet  1  . bisoprolol-hydrochlorothiazide (ZIAC) 10-6.25 MG per tablet Take 1 tablet by mouth daily.  90 tablet  1  . Calcium (SUPER CALCIUM/D) 600-200 MG-UNIT per tablet Take 1 tablet by mouth daily.        Marland Kitchen FREESTYLE LITE test strip USE ONE STRIP UP TO FOUR TIMES DAILY  50 each  3  . lovastatin (MEVACOR) 40 MG tablet Take 1 tablet (40 mg total) by mouth at bedtime.  90 tablet  1  . metFORMIN (GLUCOPHAGE) 1000 MG tablet Take 1 tablet (1,000 mg total) by mouth 2 (two) times daily with a meal.  180 tablet  1  . terbinafine (LAMISIL) 1 % cream Apply topically 2 (two) times daily.  42 g  1   Current Facility-Administered Medications on File Prior to Visit  Medication Dose Route Frequency Provider Last Rate Last Dose  . zoster vaccine live (PF) (ZOSTAVAX) injection 19,400 Units  0.65 mL Subcutaneous Once Kerri Perches, MD       Allergies  Allergen Reactions  . Ace Inhibitors     REACTION: cough    Past  Medical History  Diagnosis Date  . Allergic rhinitis, seasonal   . Dermatitis     feet  . Hidradenitis axillaris   . Obesity   . Diabetes mellitus type II   . Hyperlipidemia   . Abnormal EKG    Past Surgical History  Procedure Date  . Breast lumpectomy 1970     Benign   . Colonoscopy w/ polypectomy 2010    moderate internal hemorrhoids; scattered diverticula; pathology-benign mucosa; tubular adenoma;no etiology for chronic diarrhea    Family History  Problem Relation Age of Onset  . Cancer Brother     bone   . Obesity Sister     both    History   Social History  . Marital Status: Divorced    Spouse Name: N/A    Number of Children: 1  . Years of Education: N/A   Occupational History  . Employed     Social History Main Topics  . Smoking status: Never Smoker   . Smokeless tobacco: Not on file  . Alcohol Use: No  . Drug Use: No  . Sexually Active: Not on file   Other Topics Concern  . Not on file   Social History Narrative  . No narrative on file    ROS: Generalized fatigue; snores at night, but has no daytime somnolence; excess perspiration at night; decreased serum vitamin D; allergic rhinitis; tinea pedis;  heart murmur identified when patient was 66 years of age; mild problems with balance without recurrent falls.  All other systems reviewed and are negative.  PHYSICAL EXAM: BP 139/84  Pulse 71  Ht 5\' 9"  (1.753 m)  Wt 126.554 kg (279 lb)  BMI 41.20 kg/m2  General-Well-developed; no acute distress Body Habitus-Obese HEENT-[place]/AT; PERRL; EOM intact; conjunctiva and lids nl Neck-No JVD; no carotid bruits Endocrine-No thyromegaly Lungs-Clear lung fields; resonant percussion; normal I-to-E ratio; modest basilar systolic ejection murmur Cardiovascular- normal PMI; normal S1 and S2 Abdomen-BS normal; soft and non-tender without masses or organomegaly Musculoskeletal-No deformities, cyanosis or clubbing Neurologic-Nl cranial nerves; symmetric strength and  tone Skin- Warm, no significant lesions Extremities-Nl distal pulses; 1/2+ edema  EKG:  Tracing performed 06/24/2011 obtained and reviewed: normal sinus rhythm; low QRS voltage; leftward axis; nonspecific T-wave abnormality in the anterior leads; no previous tracing for comparison.   ASSESSMENT AND PLAN:  Cornish Bing, MD 07/26/2011 1:47 PM

## 2011-07-26 NOTE — Patient Instructions (Signed)
**Note De-identified Theresa Adkins Obfuscation** Your physician recommends that you continue on your current medications as directed. Please refer to the Current Medication list given to you today.  Your physician recommends that you schedule a follow-up appointment in: as needed  

## 2011-07-26 NOTE — Assessment & Plan Note (Signed)
Diabetic control is fairly good.  Exercise and caloric restrictions plus weight loss should have a further beneficial effect.

## 2011-07-26 NOTE — Assessment & Plan Note (Addendum)
Blood pressure is under excellent control.  Current medications will be continued.  Cardiovascular risk factors are under generally excellent control.  Patient is congratulated on a 10 pound weight loss and advised to attempt to lose at least 40 pounds more.  Her goal is 100 pounds total.  She will attempt to do some more walking, but will be unable to exercise to any great extent.  She does not appear to require routine cardiology care.  Please let me know at any time in the future that I can be of help in the treatment of this very nice woman.

## 2011-07-26 NOTE — Assessment & Plan Note (Signed)
EKG abnormalities are nonspecific and do not require evaluation via additional testing

## 2011-07-26 NOTE — Progress Notes (Deleted)
**Note De-Identified Meghan Warshawsky Obfuscation** Name: Theresa Adkins    DOB: [birth year]  Age: 66 y.o.  MR#: [id number]       PCP:  Syliva Overman, MD, MD      Insurance: @PAYORNAME @   CC:    Chief Complaint  Patient presents with  . Chest Pain    New pt for abnormal EKG per Dr Lodema Hong    VS BP 139/84  Pulse 71  Ht 5\' 9"  (1.753 m)  Wt 279 lb (126.554 kg)  BMI 41.20 kg/m2  Weights Current Weight  07/26/11 279 lb (126.554 kg)  06/24/11 290 lb 0.6 oz (131.561 kg)  02/21/11 290 lb (131.543 kg)    Blood Pressure  BP Readings from Last 3 Encounters:  07/26/11 139/84  06/24/11 136/78  02/21/11 138/80     Admit date:  (Not on file) Last encounter with RMR:  Visit date not found   Allergy Allergies  Allergen Reactions  . Ace Inhibitors     REACTION: cough    Current Outpatient Prescriptions  Medication Sig Dispense Refill  . amLODipine (NORVASC) 5 MG tablet Take 1 tablet (5 mg total) by mouth daily.  90 tablet  1  . bisoprolol-hydrochlorothiazide (ZIAC) 10-6.25 MG per tablet Take 1 tablet by mouth daily.  90 tablet  1  . Calcium (SUPER CALCIUM/D) 600-200 MG-UNIT per tablet Take 1 tablet by mouth daily.        Marland Kitchen FREESTYLE LITE test strip USE ONE STRIP UP TO FOUR TIMES DAILY  50 each  3  . lovastatin (MEVACOR) 40 MG tablet Take 1 tablet (40 mg total) by mouth at bedtime.  90 tablet  1  . metFORMIN (GLUCOPHAGE) 1000 MG tablet Take 1 tablet (1,000 mg total) by mouth 2 (two) times daily with a meal.  180 tablet  1  . terbinafine (LAMISIL) 1 % cream Apply topically 2 (two) times daily.  42 g  1   Current Facility-Administered Medications  Medication Dose Route Frequency Provider Last Rate Last Dose  . zoster vaccine live (PF) (ZOSTAVAX) injection 19,400 Units  0.65 mL Subcutaneous Once Kerri Perches, MD        Discontinued Meds:   There are no discontinued medications.  Patient Active Problem List  Diagnoses  . DIABETES MELLITUS, TYPE II  . UNSPECIFIED VITAMIN D DEFICIENCY  . HYPERLIPIDEMIA  . OBESITY  .  HYPERTENSION  . ALLERGIC RHINITIS, SEASONAL  . DERMATITIS, FEET  . HIDRADENITIS  . FATIGUE  . ACUTE PHARYNGITIS  . Tinea pedis of left foot  . Abnormal finding on EKG    LABS Office Visit on 06/24/2011  Component Date Value  . Fecal Occult Blood, POC 06/24/2011 Negative      Results for this Opt Visit:     Results for orders placed in visit on 06/24/11  HEMOCCULT (POC) BLOOD/STOOL TEST (OFFICE-1 CARD)      Component Value Range   Fecal Occult Blood, POC Negative      EKG Orders placed in visit on 07/17/11  . EKG 12-LEAD     Prior Assessment and Plan Problem List as of 07/26/2011          Cardiology Problems   HYPERLIPIDEMIA   Last Assessment & Plan Note   06/24/2011 Office Visit Signed 06/24/2011 12:31 PM by Kerri Perches, MD    Improved but uncontrolled    HYPERTENSION   Last Assessment & Plan Note   06/24/2011 Office Visit Signed 06/24/2011 12:30 PM by Kerri Perches, MD    Controlled, **Note De-Identified Eshani Maestre Obfuscation** no change in medication       Other   DIABETES MELLITUS, TYPE II   Last Assessment & Plan Note   06/24/2011 Office Visit Signed 06/24/2011 12:31 PM by Kerri Perches, MD    Improved no med change    UNSPECIFIED VITAMIN D DEFICIENCY   OBESITY   Last Assessment & Plan Note   06/24/2011 Office Visit Signed 06/24/2011 12:31 PM by Kerri Perches, MD    Deteriorated. Patient re-educated about  the importance of commitment to a  minimum of 150 minutes of exercise per week. The importance of healthy food choices with portion control discussed. Encouraged to start a food diary, count calories and to consider  joining a support group. Sample diet sheets offered. Goals set by the patient for the next several months.       ALLERGIC RHINITIS, SEASONAL   DERMATITIS, FEET   Last Assessment & Plan Note   02/21/2011 Office Visit Signed 02/23/2011 10:26 AM by Kerri Perches, MD    Deteriorated, 3 month supply of terbinafine    HIDRADENITIS   Last Assessment & Plan Note   06/24/2011  Office Visit Signed 06/24/2011  9:09 AM by Kerri Perches, MD    Ulcer in left groin    FATIGUE   ACUTE PHARYNGITIS   Tinea pedis of left foot   Abnormal finding on EKG       Imaging: No results found.   FRS Calculation: Score not calculated. Missing: Total Cholesterol

## 2011-10-29 ENCOUNTER — Other Ambulatory Visit: Payer: Self-pay | Admitting: Family Medicine

## 2011-10-29 LAB — CBC WITH DIFFERENTIAL/PLATELET
Eosinophils Relative: 2 % (ref 0–5)
HCT: 37.6 % (ref 36.0–46.0)
Lymphocytes Relative: 39 % (ref 12–46)
Lymphs Abs: 2.1 10*3/uL (ref 0.7–4.0)
MCV: 77.8 fL — ABNORMAL LOW (ref 78.0–100.0)
Monocytes Absolute: 0.3 10*3/uL (ref 0.1–1.0)
Monocytes Relative: 6 % (ref 3–12)
RBC: 4.83 MIL/uL (ref 3.87–5.11)
WBC: 5.4 10*3/uL (ref 4.0–10.5)

## 2011-10-30 LAB — TSH: TSH: 1.66 u[IU]/mL (ref 0.350–4.500)

## 2011-10-30 LAB — LIPID PANEL
HDL: 46 mg/dL (ref >39)
LDL Cholesterol: 44 mg/dL (ref 0–99)
Total CHOL/HDL Ratio: 3.3 Ratio

## 2011-10-30 LAB — COMPLETE METABOLIC PANEL WITH GFR
AST: 16 U/L (ref 0–37)
Albumin: 4.4 g/dL (ref 3.5–5.2)
Alkaline Phosphatase: 54 U/L (ref 39–117)
BUN: 12 mg/dL (ref 6–23)
Creat: 0.82 mg/dL (ref 0.50–1.10)
Potassium: 4.2 mEq/L (ref 3.5–5.3)

## 2011-11-04 ENCOUNTER — Encounter: Payer: Self-pay | Admitting: Family Medicine

## 2011-11-04 ENCOUNTER — Ambulatory Visit (INDEPENDENT_AMBULATORY_CARE_PROVIDER_SITE_OTHER): Payer: Medicare Other | Admitting: Family Medicine

## 2011-11-04 VITALS — BP 120/78 | HR 63 | Resp 16 | Ht 69.0 in | Wt 276.4 lb

## 2011-11-04 DIAGNOSIS — I10 Essential (primary) hypertension: Secondary | ICD-10-CM

## 2011-11-04 DIAGNOSIS — E669 Obesity, unspecified: Secondary | ICD-10-CM

## 2011-11-04 DIAGNOSIS — E119 Type 2 diabetes mellitus without complications: Secondary | ICD-10-CM

## 2011-11-04 DIAGNOSIS — E785 Hyperlipidemia, unspecified: Secondary | ICD-10-CM

## 2011-11-04 MED ORDER — PRAVASTATIN SODIUM 80 MG PO TABS: 80.0000 mg | ORAL_TABLET | Freq: Every day | ORAL | Status: DC

## 2011-11-04 NOTE — Assessment & Plan Note (Signed)
Controlled, no change in medication DASH diet and commitment to daily physical activity for a minimum of 30 minutes discussed and encouraged, as a part of hypertension management. The importance of attaining a healthy weight is also discussed.  

## 2011-11-04 NOTE — Assessment & Plan Note (Signed)
Improved. Pt applauded on succesful weight loss through lifestyle change, and encouraged to continue same. Weight loss goal set for the next several months.  

## 2011-11-04 NOTE — Patient Instructions (Addendum)
Annual wellness in mid to end  January  Please cut back on fried and fatty foods triglycerides are high, also cheese, butter and egg yolks  Change from lovastaitn to pravstatin 80mg   Call your insurance to see if a different meter has better coverage please  CONGRATS on weight loss, keep it up eferral to dermatology if persists.  Please use benadryl at night to help with itch, call for referal to dermatology if persists Fasting lipid, cmp, HBA1C in January befiore visit  Please schedule your mammogram and eye exam , both are due

## 2011-11-04 NOTE — Assessment & Plan Note (Signed)
Controlled, no change in medication Patient advised to reduce carb and sweets, commit to regular physical activity, take meds as prescribed, test blood as directed, and attempt to lose weight, to improve blood sugar control.  

## 2011-11-04 NOTE — Assessment & Plan Note (Signed)
Elevated triglycerides, change to pravastatin, re educated re low fat diet also

## 2011-11-04 NOTE — Progress Notes (Signed)
  Subjective:    Patient ID: Theresa Adkins, female    DOB: 14-Jun-1945, 66 y.o.   MRN: [id number]  HPI  The PT is here for follow up and re-evaluation of chronic medical conditions, medication management and review of any available recent lab and radiology data.  Preventive health is updated, specifically  Cancer screening and Immunization.   Questions or concerns regarding consultations or procedures which the PT has had in the interim are  addressed. The PT denies any adverse reactions to current medications since the last visit.  3 month h/o pruritic rash on arms, legs, neck and upper back, no fever, chills or pus from the area      Review of Systems See HPI Denies recent fever or chills. Denies sinus pressure, nasal congestion, ear pain or sore throat. Denies chest congestion, productive cough or wheezing. Denies chest pains, palpitations and leg swelling Denies abdominal pain, nausea, vomiting,diarrhea or constipation.   Denies dysuria, frequency, hesitancy or incontinence. Denies joint pain, swelling and limitation in mobility. Denies headaches, seizures, numbness, or tingling. Denies depression, anxiety or insomnia.      Objective:   Physical Exam  Patient alert and oriented and in no cardiopulmonary distress.  HEENT: No facial asymmetry, EOMI, no sinus tenderness,  oropharynx pink and moist.  Neck supple no adenopathy.  Chest: Clear to auscultation bilaterally.  CVS: S1, S2 no murmurs, no S3.  ABD: Soft non tender. Bowel sounds normal.  Ext: No edema  MS: Adequate ROM spine, shoulders, hips and knees.  Skin: multiple scratch marks and healed sores on upper and lower extremities. Thickened skin on right sole appears slightly better Psych: Good eye contact, normal affect. Memory intact not anxious or depressed appearing.  CNS: CN 2-12 intact, power, tone and sensation normal throughout.  Diabetic Foot Check:  Appearance -  calluses Skin - no unusual pallor or  redness Sensation - grossly intact to light touch Monofilament testing -  Right - Great toe, medial, central, lateral ball and posterior foot intact Left - Great toe, medial, central, lateral ball and posterior foot intact Pulses Left - Dorsalis Pedis and Posterior Tibia normal Right - Dorsalis Pedis and Posterior Tibia normal        Assessment & Plan:

## 2011-11-08 MED ORDER — BISOPROLOL-HYDROCHLOROTHIAZIDE 10-6.25 MG PO TABS: 1.0000 | ORAL_TABLET | Freq: Every day | ORAL | Status: DC

## 2011-11-08 MED ORDER — AMLODIPINE BESYLATE 5 MG PO TABS: 5.0000 mg | ORAL_TABLET | Freq: Every day | ORAL | Status: DC

## 2011-11-08 NOTE — Addendum Note (Signed)
Addended by: Kandis Fantasia B on: 11/08/2011 04:07 PM   Modules accepted: Orders, Medications

## 2011-12-28 ENCOUNTER — Other Ambulatory Visit: Payer: Self-pay | Admitting: Family Medicine

## 2012-01-13 ENCOUNTER — Other Ambulatory Visit: Payer: Self-pay | Admitting: Family Medicine

## 2012-02-04 ENCOUNTER — Other Ambulatory Visit: Payer: Self-pay | Admitting: Family Medicine

## 2012-02-28 LAB — LIPID PANEL
LDL Cholesterol: 67 mg/dL (ref 0–99)
VLDL: 46 mg/dL — ABNORMAL HIGH (ref 0–40)

## 2012-02-28 LAB — COMPREHENSIVE METABOLIC PANEL
ALT: 10 U/L (ref 0–35)
AST: 11 U/L (ref 0–37)
Albumin: 4.3 g/dL (ref 3.5–5.2)
Alkaline Phosphatase: 47 U/L (ref 39–117)
Calcium: 9.4 mg/dL (ref 8.4–10.5)
Chloride: 102 mEq/L (ref 96–112)
Creat: 0.68 mg/dL (ref 0.50–1.10)
Potassium: 4 mEq/L (ref 3.5–5.3)

## 2012-03-03 ENCOUNTER — Ambulatory Visit (INDEPENDENT_AMBULATORY_CARE_PROVIDER_SITE_OTHER): Payer: Medicare Other | Admitting: Family Medicine

## 2012-03-03 ENCOUNTER — Encounter: Payer: Self-pay | Admitting: Family Medicine

## 2012-03-03 VITALS — BP 138/74 | HR 88 | Resp 16 | Ht 69.0 in | Wt 273.0 lb

## 2012-03-03 DIAGNOSIS — E669 Obesity, unspecified: Secondary | ICD-10-CM

## 2012-03-03 DIAGNOSIS — I10 Essential (primary) hypertension: Secondary | ICD-10-CM

## 2012-03-03 DIAGNOSIS — E785 Hyperlipidemia, unspecified: Secondary | ICD-10-CM

## 2012-03-03 DIAGNOSIS — Z0000 Encounter for general adult medical examination without abnormal findings: Secondary | ICD-10-CM

## 2012-03-03 DIAGNOSIS — E119 Type 2 diabetes mellitus without complications: Secondary | ICD-10-CM

## 2012-03-03 DIAGNOSIS — L732 Hidradenitis suppurativa: Secondary | ICD-10-CM

## 2012-03-03 DIAGNOSIS — I889 Nonspecific lymphadenitis, unspecified: Secondary | ICD-10-CM | POA: Insufficient documentation

## 2012-03-03 DIAGNOSIS — B369 Superficial mycosis, unspecified: Secondary | ICD-10-CM

## 2012-03-03 DIAGNOSIS — E559 Vitamin D deficiency, unspecified: Secondary | ICD-10-CM

## 2012-03-03 MED ORDER — SULFAMETHOXAZOLE-TRIMETHOPRIM 800-160 MG PO TABS: 1.0000 | ORAL_TABLET | Freq: Two times a day (BID) | ORAL | Status: AC

## 2012-03-03 MED ORDER — METFORMIN HCL 1000 MG PO TABS: ORAL_TABLET | ORAL | Status: DC

## 2012-03-03 MED ORDER — BETAMETHASONE DIPROPIONATE 0.05 % EX CREA: TOPICAL_CREAM | Freq: Two times a day (BID) | CUTANEOUS | Status: AC

## 2012-03-03 MED ORDER — PRAVASTATIN SODIUM 80 MG PO TABS: 80.0000 mg | ORAL_TABLET | Freq: Every day | ORAL | Status: DC

## 2012-03-03 MED ORDER — AMLODIPINE BESYLATE 5 MG PO TABS: ORAL_TABLET | ORAL | Status: DC

## 2012-03-03 MED ORDER — BISOPROLOL-HYDROCHLOROTHIAZIDE 10-6.25 MG PO TABS: ORAL_TABLET | ORAL | Status: DC

## 2012-03-03 MED ORDER — TERBINAFINE HCL 250 MG PO TABS: 250.0000 mg | ORAL_TABLET | Freq: Every day | ORAL | Status: AC

## 2012-03-03 NOTE — Patient Instructions (Addendum)
F/u in 4. Month with diabetic foot exam  Weight loss goal of 4 pounds per month, please follow through on lifestyle changes discussed   Please make appt  With Dr Nile Riggs for eye exam   You need to use lamisil consistently on the heel also stronger  Steroid cream is prescribed and antibiotic tablets prescribed for the boil under the left arm. You need to reconsider surgical evaluation for this  Fasting lipid, cmp and EGFR, HBA1C and microalb, vit D in 105month

## 2012-03-03 NOTE — Progress Notes (Signed)
Subjective:    Patient ID: Theresa Adkins, female    DOB: [birth year], 67 y.o.   MRN: [id number]  HPI  Preventive Screening-Counseling & Management   Patient present here today for a Medicare annual wellness visit. Also has c/o drainage from left armpit, no fever o chills, still no desire for definitive surgical intervention C/o persistent pruritic rash with scaling on the heel of the foot, has not used prescribed med for this . Also c/o dark pruritic rash on lower abdominal fold worsening, no purulent drainage form the rash   Current Problems (verified)   Medications Prior to Visit Allergies (verified)   PAST HISTORY  Family History  Social History : Divorced 30 years mother of 1 adult son. Quit nicotine x 10 years30 Pack year history No alchol or street drug depence. 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, goal minutes every day is goal  Dietary issues discussed:Reduction of sweets, don't keep at home, reduce fats in diet   Cardiac risk factors: DM and hyperlipidemia  Depression Screen  (Note: if answer to either of the following is "Yes", a more complete depression screening is indicated)   Over the past two weeks, have you felt down, depressed or hopeless? No  Over the past two weeks, have you felt little interest or pleasure in doing things? No  Have you lost interest or pleasure in daily life? No  Do you often feel hopeless? No  Do you cry easily over simple problems? No   Activities of Daily Living  In your present state of health, do you have any difficulty performing the following activities?  Driving?: No Managing money?: No Feeding yourself?:No Getting from bed to chair?:No Climbing a flight of stairs?:No Preparing food and eating?:No Bathing or showering?:No Getting dressed?:No Getting to the  toilet?:No Using the toilet?:No Moving around from place to place?: No  Fall Risk Assessment In the past year have you fallen or had a near fall?:No Are you currently taking any medications that make you dizziness?:No   Hearing Difficulties: No Do you often ask people to speak up or repeat themselves?:No Do you experience ringing or noises in your ears?:No Do you have difficulty understanding soft or whispered voices?:No  Cognitive Testing  Alert? Yes Normal Appearance?Yes  Oriented to person? Yes Place? Yes  Time? Yes  Displays appropriate judgment?Yes  Can read the correct time from a watch face? yes Are you having problems remembering things?No  Advanced Directives have been discussed with the patient?Yes , full code   List the Names of Other Physician/Practitioners you currently use: Dr Janene Madeira any recent Medical Services you may have received from other than [facility] providers in the past year (date may be approximate).   Assessment:    Annual Wellness Exam   Plan:    During the course of the visit the patient was educated and counseled about appropriate screening and preventive services including:  A healthy diet is rich in fruit, vegetables and whole grains. Poultry fish, nuts and beans are a healthy choice for protein rather then red meat. A low sodium diet and drinking 64 ounces of water daily is generally recommended. Oils and sweet should be limited. Carbohydrates especially for those who are diabetic or overweight, should be limited to 30-45 gram per meal. It is important to eat on a regular  schedule, at least 3 times daily. Snacks should be primarily fruits, vegetables or nuts. It is important that you exercise regularly at least 30 minutes 5 times a week. If you develop chest pain, have severe difficulty breathing, or feel very tired, stop exercising immediately and seek medical attention  Immunization reviewed and updated. Cancer screening reviewed and  updated    Patient Instructions (the written plan) was given to the patient.  Medicare Attestation  I have personally reviewed:  The patient's medical and social history  Their use of alcohol, tobacco or illicit drugs  Their current medications and supplements  The patient's functional ability including ADLs,fall risks, home safety risks, cognitive, and hearing and visual impairment  Diet and physical activities  Evidence for depression or mood disorders  The patient's weight, height, BMI, and visual acuity have been recorded in the chart. I have made referrals, counseling, and provided education to the patient based on review of the above and I have provided the patient with a written personalized care plan for preventive services.     Review of Systems     Objective:   Physical Exam  Patient alert and oriented and in no cardiopulmonary distress.  HEENT: No facial asymmetry, EOMI, .  Neck supple no adenopathy.  Chest: Clear to auscultation bilaterally.  CVS: S1, S2 no murmurs, no S3.  ABD: Soft non tender. Bowel sounds normal.  Ext: No edema    Skin:hydradenitis in eft axilla with acute flare, purulent drainage form the area  Heel chapped, thick flaky skin with ulcerative lesions from the trauma orf scratching the area  Hyperpigmented maculopapular rash on lowe abdomen, punctate erosions, no purulent drainage Psych: Good eye contact, normal affect. Memory intact not anxious or depressed appearing.  CNS: CN 2-12 intact, power, n normal throughout.       Assessment & Plan:

## 2012-03-08 DIAGNOSIS — Z0000 Encounter for general adult medical examination without abnormal findings: Secondary | ICD-10-CM | POA: Insufficient documentation

## 2012-03-08 NOTE — Assessment & Plan Note (Signed)
Chronic severe problem with recurrent flare. Pt afraid of surgery, but is significantly debilitated by the condition, often  has drainage from the armpit. Antibiotic course prescribed today based on the exam, she is also encouraged to re consider definitive intervention/cure

## 2012-03-08 NOTE — Assessment & Plan Note (Signed)
Annual wellness visit completed as documented. Pt re energized to commit to regular physical activity , also a change in eating to facilitate weight loss and improved health, she has done well overall this past year in this area. Immunization and cancer screening tests are up to date, she does need a diabetic eye exam, and will move forward with this, vision screen is no alarming

## 2012-03-08 NOTE — Assessment & Plan Note (Signed)
Currently uncontrolled with rash on anterior abdominal fold, hyperpigmented, maculopapular lesion Eft hell rash persists, pt has been non compliant with treatment, oral med added  Due to severity of presentation

## 2012-03-08 NOTE — Assessment & Plan Note (Signed)
Acute flare, antibiotic course prescribed, definitive management recommended

## 2012-03-08 NOTE — Assessment & Plan Note (Signed)
Controlled, no change in medication Patient educated about the importance of limiting  Carbohydrate intake , the need to commit to daily physical activity for a minimum of 30 minutes , and to commit weight loss. The fact that changes in all these areas will reduce or eliminate all together the development of diabetes is stressed.    

## 2012-03-08 NOTE — Assessment & Plan Note (Signed)
Elevated TG , otherwise well controlled Dietary modification only, no med change at this time

## 2012-03-08 NOTE — Assessment & Plan Note (Signed)
Controlled, no change in medication  

## 2012-06-25 LAB — LIPID PANEL
Cholesterol: 187 mg/dL (ref 0–200)
HDL: 43 mg/dL (ref >39)
Total CHOL/HDL Ratio: 4.3 Ratio
Triglycerides: 261 mg/dL — ABNORMAL HIGH (ref <150)
VLDL: 52 mg/dL — ABNORMAL HIGH (ref 0–40)

## 2012-06-25 LAB — HEMOGLOBIN A1C
Hgb A1c MFr Bld: 6.3 % — ABNORMAL HIGH (ref <5.7)
Mean Plasma Glucose: 134 mg/dL — ABNORMAL HIGH (ref <117)

## 2012-06-25 LAB — COMPLETE METABOLIC PANEL WITH GFR
AST: 10 U/L (ref 0–37)
Albumin: 4.2 g/dL (ref 3.5–5.2)
Alkaline Phosphatase: 48 U/L (ref 39–117)
BUN: 11 mg/dL (ref 6–23)
Creat: 0.74 mg/dL (ref 0.50–1.10)
GFR, Est Non African American: 85 mL/min
Glucose, Bld: 109 mg/dL — ABNORMAL HIGH (ref 70–99)
Potassium: 4.4 mEq/L (ref 3.5–5.3)
Total Bilirubin: 0.4 mg/dL (ref 0.3–1.2)

## 2012-06-26 LAB — MICROALBUMIN / CREATININE URINE RATIO
Creatinine, Urine: 256.8 mg/dL
Microalb Creat Ratio: 25.8 mg/g (ref 0.0–30.0)
Microalb, Ur: 6.62 mg/dL — ABNORMAL HIGH (ref 0.00–1.89)

## 2012-07-02 ENCOUNTER — Encounter: Payer: Self-pay | Admitting: Family Medicine

## 2012-07-02 ENCOUNTER — Ambulatory Visit (INDEPENDENT_AMBULATORY_CARE_PROVIDER_SITE_OTHER): Payer: Medicare PPO | Admitting: Family Medicine

## 2012-07-02 DIAGNOSIS — L732 Hidradenitis suppurativa: Secondary | ICD-10-CM

## 2012-07-02 DIAGNOSIS — E669 Obesity, unspecified: Secondary | ICD-10-CM

## 2012-07-02 DIAGNOSIS — I10 Essential (primary) hypertension: Secondary | ICD-10-CM

## 2012-07-02 DIAGNOSIS — E785 Hyperlipidemia, unspecified: Secondary | ICD-10-CM

## 2012-07-02 DIAGNOSIS — E119 Type 2 diabetes mellitus without complications: Secondary | ICD-10-CM

## 2012-07-02 MED ORDER — ATORVASTATIN CALCIUM 40 MG PO TABS: 40.0000 mg | ORAL_TABLET | Freq: Every day | ORAL | Status: DC

## 2012-07-02 NOTE — Progress Notes (Signed)
  Subjective:    Patient ID: Theresa Adkins, female    DOB: 11-30-1945, 67 y.o.   MRN: [id number]  HPI The PT is here for follow up and re-evaluation of chronic medical conditions, medication management and review of any available recent lab and radiology data.  Preventive health is updated, specifically  Cancer screening and Immunization.   Questions or concerns regarding consultations or procedures which the PT has had in the interim are  addressed. The PT denies any adverse reactions to current medications since the last visit.  There are no new concerns.  There are no specific complaints       Review of Systems See HPI Denies recent fever or chills. Denies sinus pressure, nasal congestion, ear pain or sore throat. Denies chest congestion, productive cough or wheezing. Denies chest pains, palpitations and leg swelling Denies abdominal pain, nausea, vomiting,diarrhea or constipation.   Denies dysuria, frequency, hesitancy or incontinence. Denies joint pain, swelling and limitation in mobility. Denies headaches, seizures, numbness, or tingling. Denies depression, anxiety or insomnia.         Objective:   Physical Exam  Patient alert and oriented and in no cardiopulmonary distress.  HEENT: No facial asymmetry, EOMI, no sinus tenderness,  oropharynx pink and moist.  Neck supple no adenopathy.  Chest: Clear to auscultation bilaterally.  CVS: S1, S2 no murmurs, no S3.  ABD: Soft non tender. Bowel sounds normal.  Ext: No edema  MS: Adequate though reduced  ROM spine, shoulders, hips and knees.  Skin:chronic hidradenits in axillae, left worse than right, no current flare  Psych: Good eye contact, normal affect. Memory intact not anxious or depressed appearing.  CNS: CN 2-12 intact, power, tone and sensation normal throughout.       Assessment & Plan:

## 2012-07-02 NOTE — Patient Instructions (Addendum)
Annual wellness in 4.5 month  Please reduce fried and fatty foods, triglycerides still too high  Congrats on weight loss and improved blood sugars.  Please schedule your mammogram  Call when you know what meter is covered so supplies can be prescribed  Seriously consider joining the [employer], and going , [employer] covers this.  Weight loss goal of 8 pounds  Fasting lipid, cmp and EGFR, HBA1C, CBC and TSH in 4.5 month

## 2012-07-03 MED ORDER — BISOPROLOL-HYDROCHLOROTHIAZIDE 10-6.25 MG PO TABS: ORAL_TABLET | ORAL | Status: DC

## 2012-07-03 MED ORDER — METFORMIN HCL 1000 MG PO TABS: ORAL_TABLET | ORAL | Status: DC

## 2012-07-03 MED ORDER — AMLODIPINE BESYLATE 5 MG PO TABS: ORAL_TABLET | ORAL | Status: DC

## 2012-07-30 NOTE — Assessment & Plan Note (Signed)
Improved. Pt applauded on succesful weight loss through lifestyle change, and encouraged to continue same. Weight loss goal set for the next several months.  

## 2012-07-30 NOTE — Assessment & Plan Note (Signed)
No acute flare currently, though severe in axillae chronically, still refusing surgery

## 2012-07-30 NOTE — Assessment & Plan Note (Signed)
Elevated TG otherwise well controlled. Hyperlipidemia:Low fat diet discussed and encouraged.

## 2012-07-30 NOTE — Assessment & Plan Note (Signed)
Improved control, pt applauded on this, no med change Patient advised to reduce carb and sweets, commit to regular physical activity, take meds as prescribed, test blood as directed, and attempt to lose weight, to improve blood sugar control.

## 2012-07-30 NOTE — Assessment & Plan Note (Signed)
Controlled, no change in medication DASH diet and commitment to daily physical activity for a minimum of 30 minutes discussed and encouraged, as a part of hypertension management. The importance of attaining a healthy weight is also discussed.  

## 2012-09-17 ENCOUNTER — Other Ambulatory Visit: Payer: Self-pay | Admitting: Family Medicine

## 2012-10-06 ENCOUNTER — Other Ambulatory Visit: Payer: Self-pay | Admitting: Family Medicine

## 2012-10-16 ENCOUNTER — Telehealth: Payer: Self-pay

## 2012-10-16 NOTE — Telephone Encounter (Signed)
Patient states she has a rash that has come up on her arms and back. Itchy. Has been like this since last week. Advised benadryl and to follow up with an urgent care center if it worsens or doesn't go away

## 2012-11-06 ENCOUNTER — Other Ambulatory Visit: Payer: Self-pay | Admitting: Family Medicine

## 2012-11-06 LAB — CBC
HCT: 35.9 % — ABNORMAL LOW (ref 36.0–46.0)
Hemoglobin: 12.1 g/dL (ref 12.0–15.0)
MCH: 26.8 pg (ref 26.0–34.0)
MCHC: 33.7 g/dL (ref 30.0–36.0)
MCV: 79.4 fL (ref 78.0–100.0)

## 2012-11-06 LAB — COMPLETE METABOLIC PANEL WITH GFR
ALT: 11 U/L (ref 0–35)
AST: 11 U/L (ref 0–37)
Alkaline Phosphatase: 59 U/L (ref 39–117)
Sodium: 143 mEq/L (ref 135–145)
Total Bilirubin: 0.5 mg/dL (ref 0.3–1.2)
Total Protein: 7.4 g/dL (ref 6.0–8.3)

## 2012-11-06 LAB — LIPID PANEL
LDL Cholesterol: 55 mg/dL (ref 0–99)
Triglycerides: 169 mg/dL — ABNORMAL HIGH (ref <150)
VLDL: 34 mg/dL (ref 0–40)

## 2012-11-06 LAB — HEMOGLOBIN A1C: Mean Plasma Glucose: 148 mg/dL — ABNORMAL HIGH (ref <117)

## 2012-11-13 ENCOUNTER — Encounter: Payer: Self-pay | Admitting: Family Medicine

## 2012-11-13 ENCOUNTER — Ambulatory Visit (INDEPENDENT_AMBULATORY_CARE_PROVIDER_SITE_OTHER): Payer: Medicare PPO | Admitting: Family Medicine

## 2012-11-13 VITALS — BP 138/78 | HR 77 | Resp 16 | Wt 279.1 lb

## 2012-11-13 DIAGNOSIS — I10 Essential (primary) hypertension: Secondary | ICD-10-CM

## 2012-11-13 DIAGNOSIS — Z1211 Encounter for screening for malignant neoplasm of colon: Secondary | ICD-10-CM

## 2012-11-13 DIAGNOSIS — E785 Hyperlipidemia, unspecified: Secondary | ICD-10-CM

## 2012-11-13 DIAGNOSIS — R195 Other fecal abnormalities: Secondary | ICD-10-CM

## 2012-11-13 DIAGNOSIS — E1129 Type 2 diabetes mellitus with other diabetic kidney complication: Secondary | ICD-10-CM

## 2012-11-13 DIAGNOSIS — Z23 Encounter for immunization: Secondary | ICD-10-CM

## 2012-11-13 DIAGNOSIS — E669 Obesity, unspecified: Secondary | ICD-10-CM

## 2012-11-13 DIAGNOSIS — E1121 Type 2 diabetes mellitus with diabetic nephropathy: Secondary | ICD-10-CM

## 2012-11-13 DIAGNOSIS — L732 Hidradenitis suppurativa: Secondary | ICD-10-CM

## 2012-11-13 DIAGNOSIS — N058 Unspecified nephritic syndrome with other morphologic changes: Secondary | ICD-10-CM

## 2012-11-13 LAB — POC HEMOCCULT BLD/STL (OFFICE/1-CARD/DIAGNOSTIC): Fecal Occult Blood, POC: POSITIVE

## 2012-11-13 MED ORDER — BISOPROLOL-HYDROCHLOROTHIAZIDE 10-6.25 MG PO TABS: ORAL_TABLET | ORAL | Status: DC

## 2012-11-13 MED ORDER — SULFAMETHOXAZOLE-TMP DS 800-160 MG PO TABS: 1.0000 | ORAL_TABLET | Freq: Two times a day (BID) | ORAL | Status: AC

## 2012-11-13 NOTE — Assessment & Plan Note (Signed)
Adequate though less well controlled Patient advised to reduce carb and sweets, commit to regular physical activity, take meds as prescribed, test blood as directed, and attempt to lose weight, to improve blood sugar control.

## 2012-11-13 NOTE — Assessment & Plan Note (Signed)
Improved, TG still elevated Hyperlipidemia:Low fat diet discussed and encouraged.

## 2012-11-13 NOTE — Assessment & Plan Note (Signed)
Deteriorated. Patient re-educated about  the importance of commitment to a  minimum of 150 minutes of exercise per week. The importance of healthy food choices with portion control discussed. Encouraged to start a food diary, count calories and to consider  joining a support group. Sample diet sheets offered. Goals set by the patient for the next several months.    

## 2012-11-13 NOTE — Assessment & Plan Note (Signed)
Controlled, no change in medication DASH diet and commitment to daily physical activity for a minimum of 30 minutes discussed and encouraged, as a part of hypertension management. The importance of attaining a healthy weight is also discussed.  

## 2012-11-13 NOTE — Patient Instructions (Addendum)
F/u in 4 month, call if  You need me before please  Flu vaccine today  Cholesterol is much improved, all labs are excellent, except  Blood sugar slightly increased  Please change eating habits as we discussed, and start exercising regularly, just walking for 10 minutes 3 times each day will be enough, you can do this at your home or in your neighborhood   Septra is prescribed for the rash under your right armopit, schedule and keep mammogram appointment in next 2 to 3 weeks please  Please return 3 stool cards within the next 1 week,  For testing since the specimen today had hidden blood  HBA1C and chem 7 and EGFR non fast in 4 month  Weight loss goal of 2 to 2.5 pounds each month

## 2012-11-13 NOTE — Assessment & Plan Note (Signed)
Current flare in left axilla. Antibioitc course prescribed and pt encouraged strongly to get her mammogram which is past due

## 2012-11-13 NOTE — Progress Notes (Signed)
  Subjective:    Patient ID: Theresa Adkins, female    DOB: February 25, 1945, 66 y.o.   MRN: [id number]  HPI The PT is here for follow up and re-evaluation of chronic medical conditions, medication management and review of any available recent lab and radiology data.  Preventive health is updated, specifically  Cancer screening and Immunization.Still holding on mammogram because of flare of adenitis in left armpit   Questions or concerns regarding consultations or procedures which the PT has had in the interim are  Addressed.Has seen optalmologist with excellent bill of health The PT denies any adverse reactions to current medications since the last visit.  Has been overeating with excessive weight gain, very little interaction with people, stays home most the time, no real interest in changing and depression screen is currently negative. Does have mild paranoia also  ROS: The PT is here for follow up and re-evaluation of chronic medical conditions, medication management and review of any available recent lab and radiology data.  Preventive health is updated, specifically  Cancer screening and Immunization.   Questions or concerns regarding consultations or procedures which the PT has had in the interim are  addressed. The PT denies any adverse reactions to current medications since the last visit.  There are no new concerns.  There are no specific complaints    Denies recent fever or chills. Denies sinus pressure, nasal congestion, ear pain or sore throat. Denies chest congestion, productive cough or wheezing. Denies chest pains, palpitations and leg swelling Denies abdominal pain, nausea, vomiting,diarrhea or constipation.   Denies dysuria, frequency, hesitancy or incontinence. Denies uncontrolled  joint pain, swelling and limitation in mobility. Denies headaches, seizures, numbness, or tingling. Denies depression, anxiety or insomnia.   PE: Patient alert and oriented and in no  cardiopulmonary distress.  HEENT: No facial asymmetry, EOMI, no sinus tenderness,  oropharynx pink and moist.  Neck supple no adenopathy.  Chest: Clear to auscultation bilaterally.  CVS: S1, S2 no murmurs, no S3.  ABD: Soft non tender. Bowel sounds normal. Recta: heme positive stool Ext: No edema  MS: Adequate ROM spine, shoulders, hips and knees.  Skin: adenitis flare in left axilla with purulent drainage  Psych: Good eye contact, normal affect. Memory intact not anxious or depressed appearing.  CNS: CN 2-12 intact, power, tone and sensation normal throughout.               Assessment:      Plan:       Review of Systems     Objective:   Physical Exam        Assessment & Plan:

## 2012-11-13 NOTE — Assessment & Plan Note (Signed)
Pt to return 3 stool cards, several attempts made before specimen obtained Colonoscopy recent with no abnormality, pt denies change in bowel movement

## 2012-12-17 ENCOUNTER — Telehealth: Payer: Self-pay | Admitting: *Deleted

## 2012-12-17 MED ORDER — METFORMIN HCL 1000 MG PO TABS: ORAL_TABLET | ORAL | Status: DC

## 2012-12-17 NOTE — Telephone Encounter (Signed)
Pt called and left message for a nurse to return her phone call. [phone]

## 2012-12-17 NOTE — Telephone Encounter (Signed)
Wanted metformin sent to [facility]. Sent in

## 2013-02-18 DIAGNOSIS — D0590 Unspecified type of carcinoma in situ of unspecified breast: Secondary | ICD-10-CM | POA: Insufficient documentation

## 2013-03-10 LAB — COMPLETE METABOLIC PANEL WITH GFR
ALT: 13 U/L (ref 0–35)
AST: 14 U/L (ref 0–37)
Albumin: 4.4 g/dL (ref 3.5–5.2)
Alkaline Phosphatase: 58 U/L (ref 39–117)
BUN: 10 mg/dL (ref 6–23)
CO2: 26 mEq/L (ref 19–32)
Calcium: 9.6 mg/dL (ref 8.4–10.5)
Chloride: 100 mEq/L (ref 96–112)
Creat: 0.78 mg/dL (ref 0.50–1.10)
GFR, Est African American: >89 mL/min
GFR, Est Non African American: 79 mL/min
Glucose, Bld: 122 mg/dL — ABNORMAL HIGH (ref 70–99)
Potassium: 4.3 mEq/L (ref 3.5–5.3)
Sodium: 139 mEq/L (ref 135–145)
Total Bilirubin: 0.7 mg/dL (ref 0.3–1.2)
Total Protein: 7.2 g/dL (ref 6.0–8.3)

## 2013-03-10 LAB — HEMOGLOBIN A1C
Hgb A1c MFr Bld: 7.5 % — ABNORMAL HIGH (ref <5.7)
Mean Plasma Glucose: 169 mg/dL — ABNORMAL HIGH (ref <117)

## 2013-03-15 ENCOUNTER — Encounter: Payer: Self-pay | Admitting: Family Medicine

## 2013-03-15 ENCOUNTER — Ambulatory Visit (INDEPENDENT_AMBULATORY_CARE_PROVIDER_SITE_OTHER): Payer: Medicare PPO | Admitting: Family Medicine

## 2013-03-15 VITALS — BP 130/72 | HR 80 | Resp 16 | Ht 69.0 in | Wt 285.4 lb

## 2013-03-15 DIAGNOSIS — E1121 Type 2 diabetes mellitus with diabetic nephropathy: Secondary | ICD-10-CM

## 2013-03-15 DIAGNOSIS — E785 Hyperlipidemia, unspecified: Secondary | ICD-10-CM

## 2013-03-15 DIAGNOSIS — I10 Essential (primary) hypertension: Secondary | ICD-10-CM

## 2013-03-15 DIAGNOSIS — E1129 Type 2 diabetes mellitus with other diabetic kidney complication: Secondary | ICD-10-CM

## 2013-03-15 DIAGNOSIS — N058 Unspecified nephritic syndrome with other morphologic changes: Secondary | ICD-10-CM

## 2013-03-15 DIAGNOSIS — E669 Obesity, unspecified: Secondary | ICD-10-CM

## 2013-03-15 MED ORDER — AMLODIPINE BESYLATE 5 MG PO TABS: ORAL_TABLET | ORAL | Status: DC

## 2013-03-15 NOTE — Progress Notes (Signed)
   Subjective:    Patient ID: Theresa Adkins, female    DOB: 04/26/1945, 68 y.o.   MRN: [id number]  HPI  The PT is here for follow up and re-evaluation of chronic medical conditions, medication management and review of any available recent lab and radiology data.  Preventive health is updated, specifically  Cancer screening and Immunization.  Still needs her mammogram over 2 years was the last, states she will get before next visit . The PT denies any adverse reactions to current medications since the last visit.  C/o cold toes, denies calf pain  With activity, no foot ulcers Increased sugar intake, weight gain, uncontrolled blood sugar, not testing and no exercise commitment. Will change all of these as this is necessary   Review of Systems See HPI Denies recent fever or chills. Denies sinus pressure, nasal congestion, ear pain or sore throat. Denies chest congestion, productive cough or wheezing. Denies chest pains, palpitations and leg swelling Denies abdominal pain, nausea, vomiting,diarrhea or constipation.   Denies dysuria, frequency, hesitancy or incontinence. Denies joint pain, does have some mobility. Denies headaches, seizures, numbness, or tingling. Denies depression, anxiety or insomnia. Chronic hydradenitis        Objective:   Physical Exam  Patient alert and oriented and in no cardiopulmonary distress.  HEENT: No facial asymmetry, EOMI, no sinus tenderness,  oropharynx pink and moist.  Neck supple no adenopathy.  Chest: Clear to auscultation bilaterally.  CVS: S1, S2 no murmurs, no S3.  ABD: Soft non tender.   Ext: No edema  MS: Adequate ROM spine, shoulders, hips and knees.  Skin: Intact, no ulcerations or rash noted.  Psych: Good eye contact, normal affect. Memory intact not anxious or depressed appearing.  CNS: CN 2-12 intact, power, tone  normal throughout.       Assessment & Plan:

## 2013-03-15 NOTE — Assessment & Plan Note (Signed)
Deteriorated. Pt refusing additional medication, states she will work on lifestyle Patient advised to reduce carb and sweets, commit to regular physical activity, take meds as prescribed, test blood as directed, and attempt to lose weight, to improve blood sugar control.

## 2013-03-15 NOTE — Assessment & Plan Note (Signed)
Controlled, no change in medication DASH diet and commitment to daily physical activity for a minimum of 30 minutes discussed and encouraged, as a part of hypertension management. The importance of attaining a healthy weight is also discussed.  

## 2013-03-15 NOTE — Assessment & Plan Note (Signed)
Deteriorated. Patient re-educated about  the importance of commitment to a  minimum of 150 minutes of exercise per week. The importance of healthy food choices with portion control discussed. Encouraged to start a food diary, count calories and to consider  joining a support group. Sample diet sheets offered. Goals set by the patient for the next several months.    

## 2013-03-15 NOTE — Patient Instructions (Signed)
F/u May 26 or after.  Fasting lipid, cmp and EGFR, HBa1C, microalb May 21 or after  BP is good , no exchange,mblood sugar increased, you need to stop tootsie rolls, and reduce food intake.  It is important that you exercise regularly at least 30 minutes 5 times a week. If you develop chest pain, have severe difficulty breathing, or feel very tired, stop exercising immediately and seek medical attention   PLEASE do the mammogram, this is past due   Weigth loss goal of 2 to 3 pounds per month

## 2013-03-15 NOTE — Assessment & Plan Note (Signed)
Updated lab needed at/ before next visit. Hyperlipidemia:Low fat diet discussed and encouraged.   

## 2013-03-19 ENCOUNTER — Ambulatory Visit: Payer: Medicare PPO | Admitting: Family Medicine

## 2013-03-19 DIAGNOSIS — Z1211 Encounter for screening for malignant neoplasm of colon: Secondary | ICD-10-CM

## 2013-03-19 LAB — HEMOCCULT GUIAC POC 1CARD (OFFICE)
Card #2 Fecal Occult Blod, POC: NEGATIVE
FECAL OCCULT BLD: NEGATIVE
Fecal Occult Blood, POC: NEGATIVE

## 2013-03-25 ENCOUNTER — Other Ambulatory Visit (HOSPITAL_COMMUNITY): Payer: Self-pay | Admitting: Pediatrics

## 2013-03-25 DIAGNOSIS — Z139 Encounter for screening, unspecified: Secondary | ICD-10-CM

## 2013-03-30 ENCOUNTER — Ambulatory Visit (HOSPITAL_COMMUNITY)
Admission: RE | Admit: 2013-03-30 | Discharge: 2013-03-30 | Disposition: A | Discharge status: Home / Self Care | Payer: Medicare PPO | Source: Ambulatory Visit | Attending: Pediatrics | Admitting: Pediatrics

## 2013-03-30 DIAGNOSIS — Z1231 Encounter for screening mammogram for malignant neoplasm of breast: Secondary | ICD-10-CM | POA: Insufficient documentation

## 2013-03-30 DIAGNOSIS — Z139 Encounter for screening, unspecified: Secondary | ICD-10-CM

## 2013-04-05 ENCOUNTER — Other Ambulatory Visit: Payer: Self-pay | Admitting: Family Medicine

## 2013-04-05 ENCOUNTER — Telehealth: Payer: Self-pay | Admitting: Family Medicine

## 2013-04-05 DIAGNOSIS — Z139 Encounter for screening, unspecified: Secondary | ICD-10-CM

## 2013-04-05 DIAGNOSIS — R928 Other abnormal and inconclusive findings on diagnostic imaging of breast: Secondary | ICD-10-CM

## 2013-04-05 NOTE — Telephone Encounter (Signed)
Noted  

## 2013-04-05 NOTE — Telephone Encounter (Signed)
Noted will f/u rept mamo/ furhter imaging

## 2013-04-21 ENCOUNTER — Ambulatory Visit (HOSPITAL_COMMUNITY)
Admission: RE | Admit: 2013-04-21 | Discharge: 2013-04-21 | Disposition: A | Discharge status: Home / Self Care | Payer: Medicare PPO | Source: Ambulatory Visit | Attending: Family Medicine | Admitting: Family Medicine

## 2013-04-21 DIAGNOSIS — R928 Other abnormal and inconclusive findings on diagnostic imaging of breast: Secondary | ICD-10-CM | POA: Insufficient documentation

## 2013-04-23 ENCOUNTER — Other Ambulatory Visit: Payer: Self-pay | Admitting: Family Medicine

## 2013-04-23 DIAGNOSIS — R921 Mammographic calcification found on diagnostic imaging of breast: Secondary | ICD-10-CM

## 2013-05-17 ENCOUNTER — Ambulatory Visit
Admission: RE | Admit: 2013-05-17 | Discharge: 2013-05-17 | Disposition: A | Discharge status: Home / Self Care | Payer: Medicare PPO | Source: Ambulatory Visit | Attending: Family Medicine | Admitting: Family Medicine

## 2013-05-17 DIAGNOSIS — R921 Mammographic calcification found on diagnostic imaging of breast: Secondary | ICD-10-CM

## 2013-05-18 ENCOUNTER — Other Ambulatory Visit: Payer: Self-pay | Admitting: Family Medicine

## 2013-05-18 DIAGNOSIS — D0510 Intraductal carcinoma in situ of unspecified breast: Secondary | ICD-10-CM

## 2013-05-26 ENCOUNTER — Ambulatory Visit
Admission: RE | Admit: 2013-05-26 | Discharge: 2013-05-26 | Disposition: A | Discharge status: Home / Self Care | Payer: Medicare PPO | Source: Ambulatory Visit | Attending: Family Medicine | Admitting: Family Medicine

## 2013-05-26 DIAGNOSIS — D0510 Intraductal carcinoma in situ of unspecified breast: Secondary | ICD-10-CM

## 2013-05-26 MED ORDER — GADOBENATE DIMEGLUMINE 529 MG/ML IV SOLN
20.0000 mL | Freq: Once | INTRAVENOUS | Status: AC | PRN
  Administered 2013-05-26: 20 mL via INTRAVENOUS

## 2013-06-07 ENCOUNTER — Telehealth: Payer: Self-pay | Admitting: Family Medicine

## 2013-06-07 DIAGNOSIS — D0510 Intraductal carcinoma in situ of unspecified breast: Secondary | ICD-10-CM

## 2013-06-07 NOTE — Telephone Encounter (Signed)
Spoke with pt explained that she she absolutely NEED surgical management of her breast cancer. Sttaes she went to Dr Lourena Simmonds e last week as directed, and his staff did not know why she was there, so she is a bit turned off. I told her hat in [place], Dr Arnoldo Morale is the Doc I recommend for breast surgery, also gave her option of breast Docs in [place], she is to call by 10 am tomorrow with her decision as to where she will go. I am entering a general referral and will ask referral staff to call  pt if she has not heard from her by 10am as follow through

## 2013-06-07 NOTE — Telephone Encounter (Signed)
Pls refer her to surgeon of her choice re management of her breast cancer , I sent another message with the details

## 2013-06-08 ENCOUNTER — Telehealth: Payer: Self-pay

## 2013-06-08 NOTE — Telephone Encounter (Signed)
This has already been done per scheduling staff

## 2013-06-14 ENCOUNTER — Ambulatory Visit (INDEPENDENT_AMBULATORY_CARE_PROVIDER_SITE_OTHER): Payer: Medicare PPO | Admitting: General Surgery

## 2013-06-14 ENCOUNTER — Encounter (INDEPENDENT_AMBULATORY_CARE_PROVIDER_SITE_OTHER): Payer: Self-pay | Admitting: General Surgery

## 2013-06-14 VITALS — BP 128/80 | HR 78 | Temp 97.1°F | Ht 69.0 in | Wt 281.6 lb

## 2013-06-14 DIAGNOSIS — D0511 Intraductal carcinoma in situ of right breast: Secondary | ICD-10-CM | POA: Insufficient documentation

## 2013-06-14 DIAGNOSIS — D0590 Unspecified type of carcinoma in situ of unspecified breast: Secondary | ICD-10-CM

## 2013-06-14 NOTE — Progress Notes (Signed)
Patient ID: ILISA HAYWORTH, female   DOB: 03/25/1945, 68 y.o.   MRN: [id number]  Chief Complaint  Patient presents with  . eval right breast    HPI JOCELYN NOLD is a 68 y.o. female.  We are asked to see the patient in consultation by Dr. Moshe Cipro to evaluate her for a right breast DCIS. The patient is a 68 year old black female who recently went for a routine screening mammogram. At that time she denies any breast pain or discharge from her nipple. She has no personal or family history of breast cancer. A 2.7 cm cluster of calcifications was seen in the upper outer right breast. This was biopsied and came back as DCIS. She was ER positive. She notes that she quit smoking approximately 11 years ago  HPI  Past Medical History  Diagnosis Date  . Allergic rhinitis, seasonal   . Dermatitis     feet  . Hidradenitis axillaris   . Obesity   . Diabetes mellitus type II   . Hyperlipidemia   . Abnormal EKG   . Tobacco abuse, in remission     30-40 pack years; quit in 2003    Past Surgical History  Procedure Laterality Date  . Breast lumpectomy  1970     Benign   . Colonoscopy w/ polypectomy  2010    moderate internal hemorrhoids; scattered diverticula; pathology-benign mucosa; tubular adenoma;no etiology for chronic diarrhea    Family History  Problem Relation Age of Onset  . Cancer Brother     bone   . Obesity Sister     both  . Heart disease Mother     Social History History  Substance Use Topics  . Smoking status: Former Research scientist (life sciences)  . Smokeless tobacco: Not on file  . Alcohol Use: No    Allergies  Allergen Reactions  . Ace Inhibitors Other (See Comments)    Cough  . Aspirin Nausea Only    Current Outpatient Prescriptions  Medication Sig Dispense Refill  . amLODipine (NORVASC) 5 MG tablet TAKE 1 TABLET BY MOUTH DAILY  90 tablet  1  . atorvastatin (LIPITOR) 40 MG tablet Take 1 tablet (40 mg total) by mouth daily.  90 tablet  11  . bisoprolol-hydrochlorothiazide (ZIAC)  10-6.25 MG per tablet TAKE ONE (1) TABLET EACH DAY  90 tablet  1  . Calcium (SUPER CALCIUM/D) 600-200 MG-UNIT per tablet Take 1 tablet by mouth daily.        Marland Kitchen FREESTYLE LITE test strip USE ONE STRIP UP TO FOUR TIMES DAILY  50 each  3  . metFORMIN (GLUCOPHAGE) 1000 MG tablet TAKE 1 TABLET BY MOUTH TWICE DAILY WITH A MEAL  180 tablet  1   No current facility-administered medications for this visit.    Review of Systems Review of Systems  Constitutional: Negative.   HENT: Negative.   Eyes: Negative.   Respiratory: Negative.   Cardiovascular: Negative.   Gastrointestinal: Negative.   Endocrine: Negative.   Genitourinary: Negative.   Musculoskeletal: Negative.   Skin: Negative.   Allergic/Immunologic: Negative.   Neurological: Negative.   Hematological: Negative.   Psychiatric/Behavioral: Negative.     Blood pressure 128/80, pulse 78, temperature 97.1 F (36.2 C), height 5\' 9"  (1.753 m), weight 281 lb 9.6 oz (127.733 kg).  Physical Exam Physical Exam  Constitutional: She is oriented to person, place, and time. She appears well-developed and well-nourished.  HENT:  Head: Normocephalic and atraumatic.  Eyes: Conjunctivae and EOM are normal. Pupils are equal, round,  and reactive to light.  Neck: Normal range of motion. Neck supple.  Cardiovascular: Normal rate, regular rhythm and normal heart sounds.   Pulmonary/Chest: Effort normal and breath sounds normal.  There is no palpable mass in either breast. There is no palpable axillary, supraclavicular, or cervical lymphadenopathy.  Abdominal: Soft. Bowel sounds are normal.  Musculoskeletal: Normal range of motion.  Lymphadenopathy:    She has no cervical adenopathy.  Neurological: She is alert and oriented to person, place, and time.  Skin: Skin is warm and dry.  Psychiatric: She has a normal mood and affect. Her behavior is normal.    Data Reviewed As above  Assessment    The patient has a 2.7 cm area of DCIS in the upper  outer right breast. Since she has a large breast I think she has the option of breast conservation versus mastectomy. I've spoke to her in detail about the different options for surgery and she favors breast conservation. She will require a wire localized lumpectomy and sentinel node mapping. I have discussed with her in detail the risks and benefits of the operation to do this as well as some of the technical aspects and she understands and wishes to proceed     Plan    Plan for right breast wire localized lumpectomy and sentinel node mapping        Luella Cook III 06/14/2013, 4:27 PM

## 2013-06-14 NOTE — Patient Instructions (Signed)
Plan for right breast wire localized lumpectomy and sentinel  Node mapping

## 2013-06-22 ENCOUNTER — Encounter (HOSPITAL_COMMUNITY): Payer: Self-pay | Admitting: Pharmacy Technician

## 2013-06-23 ENCOUNTER — Other Ambulatory Visit (INDEPENDENT_AMBULATORY_CARE_PROVIDER_SITE_OTHER): Payer: Self-pay | Admitting: General Surgery

## 2013-06-23 DIAGNOSIS — D0511 Intraductal carcinoma in situ of right breast: Secondary | ICD-10-CM

## 2013-06-25 ENCOUNTER — Encounter (HOSPITAL_COMMUNITY): Payer: Self-pay

## 2013-06-25 ENCOUNTER — Encounter (HOSPITAL_COMMUNITY)
Admission: RE | Admit: 2013-06-25 | Discharge: 2013-06-25 | Disposition: A | Discharge status: Home / Self Care | Payer: Medicare PPO | Source: Ambulatory Visit | Attending: General Surgery | Admitting: General Surgery

## 2013-06-25 ENCOUNTER — Ambulatory Visit (HOSPITAL_COMMUNITY)
Admission: RE | Admit: 2013-06-25 | Discharge: 2013-06-25 | Disposition: A | Discharge status: Home / Self Care | Payer: Medicare PPO | Source: Ambulatory Visit | Attending: Anesthesiology | Admitting: Anesthesiology

## 2013-06-25 DIAGNOSIS — D0590 Unspecified type of carcinoma in situ of unspecified breast: Secondary | ICD-10-CM | POA: Insufficient documentation

## 2013-06-25 DIAGNOSIS — Z01818 Encounter for other preprocedural examination: Secondary | ICD-10-CM | POA: Insufficient documentation

## 2013-06-25 DIAGNOSIS — Z01810 Encounter for preprocedural cardiovascular examination: Secondary | ICD-10-CM | POA: Insufficient documentation

## 2013-06-25 DIAGNOSIS — Z01812 Encounter for preprocedural laboratory examination: Secondary | ICD-10-CM | POA: Insufficient documentation

## 2013-06-25 HISTORY — DX: Essential (primary) hypertension: I10

## 2013-06-25 HISTORY — DX: Cardiac murmur, unspecified: R01.1

## 2013-06-25 LAB — BASIC METABOLIC PANEL
BUN: 8 mg/dL (ref 6–23)
CALCIUM: 9.8 mg/dL (ref 8.4–10.5)
CO2: 25 meq/L (ref 19–32)
Chloride: 101 mEq/L (ref 96–112)
Creatinine, Ser: 0.75 mg/dL (ref 0.50–1.10)
GFR calc Af Amer: >90 mL/min (ref >90)
GFR calc non Af Amer: 86 mL/min — ABNORMAL LOW (ref >90)
Glucose, Bld: 132 mg/dL — ABNORMAL HIGH (ref 70–99)
POTASSIUM: 4.3 meq/L (ref 3.7–5.3)
SODIUM: 143 meq/L (ref 137–147)

## 2013-06-25 LAB — CBC
HCT: 38.4 % (ref 36.0–46.0)
Hemoglobin: 12.3 g/dL (ref 12.0–15.0)
MCH: 26.7 pg (ref 26.0–34.0)
MCHC: 32 g/dL (ref 30.0–36.0)
MCV: 83.3 fL (ref 78.0–100.0)
Platelets: 317 10*3/uL (ref 150–400)
RBC: 4.61 MIL/uL (ref 3.87–5.11)
RDW: 16.6 % — ABNORMAL HIGH (ref 11.5–15.5)
WBC: 6.8 10*3/uL (ref 4.0–10.5)

## 2013-06-25 NOTE — Pre-Procedure Instructions (Addendum)
Theresa Adkins  06/25/2013   Your procedure is scheduled on:  07/01/13  Report to [facility] short stay admitting at [place] to ssc  When finish at breast center  Call this number if you have problems the morning of surgery: [phone]   Remember:   Do not eat food or drink liquids after midnight.   Take these medicines the morning of surgery with A SIP OF WATER: none(meds taken at nite per pt)      Take all meds as ordered  until day of surgery except as instructed below or per dr     Bridgette Habermann all herbel meds, nsaids (aleve,naproxen,advil,ibuprofen) 5 days prior to surgery (last 06/25/13) including aspirin , vitamins    NO DIABETIC MEDS DAY OF SURGERY   Do not wear jewelry, make-up or nail polish.  Do not wear lotions, powders, or perfumes. You may wear deodorant.  Do not shave 48 hours prior to surgery. Men may shave face and neck.  Do not bring valuables to the hospital.  [facility] is not responsible                  for any belongings or valuables.               Contacts, dentures or bridgework may not be worn into surgery.  Leave suitcase in the car. After surgery it may be brought to your room.  For patients admitted to the hospital, discharge time is determined by your                treatment team.               Patients discharged the day of surgery will not be allowed to drive  home.  Name and phone number of your driver:   Special Instructions:  Special Instructions: [place] - Preparing for Surgery  Before surgery, you can play an important role.  Because skin is not sterile, your skin needs to be as free of germs as possible.  You can reduce the number of germs on you skin by washing with CHG (chlorahexidine gluconate) soap before surgery.  CHG is an antiseptic cleaner which kills germs and bonds with the skin to continue killing germs even after washing.  Please DO NOT use if you have an allergy to CHG or antibacterial soaps.  If your skin becomes reddened/irritated  stop using the CHG and inform your nurse when you arrive at Short Stay.  Do not shave (including legs and underarms) for at least 48 hours prior to the first CHG shower.  You may shave your face.  Please follow these instructions carefully:   1.  Shower with CHG Soap the night before surgery and the morning of Surgery.  2.  If you choose to wash your hair, wash your hair first as usual with your normal shampoo.  3.  After you shampoo, rinse your hair and body thoroughly to remove the Shampoo.  4.  Use CHG as you would any other liquid soap.  You can apply chg directly  to the skin and wash gently with scrungie or a clean washcloth.  5.  Apply the CHG Soap to your body ONLY FROM THE NECK DOWN.  Do not use on open wounds or open sores.  Avoid contact with your eyes ears, mouth and genitals (private parts).  Wash genitals (private parts)       with your normal soap.  6.  Wash thoroughly, paying special  attention to the area where your surgery will be performed.  7.  Thoroughly rinse your body with warm water from the neck down.  8.  DO NOT shower/wash with your normal soap after using and rinsing off the CHG Soap.  9.  Pat yourself dry with a clean towel.            10.  Wear clean pajamas.            11.  Place clean sheets on your bed the night of your first shower and do not sleep with pets.  Day of Surgery  Do not apply any lotions/deodorants the morning of surgery.  Please wear clean clothes to the hospital/surgery center.   Please read over the following fact sheets that you were given: Pain Booklet, Coughing and Deep Breathing and Surgical Site Infection Prevention

## 2013-06-25 NOTE — Progress Notes (Signed)
06/25/13 0850  OBSTRUCTIVE SLEEP APNEA  Have you ever been diagnosed with sleep apnea through a sleep study? No  Do you snore loudly (loud enough to be heard through closed doors)?  0  Do you often feel tired, fatigued, or sleepy during the daytime? 0  Has anyone observed you stop breathing during your sleep? 0  Do you have, or are you being treated for high blood pressure? 1  BMI more than 35 kg/m2? 1  Age over 68 years old? 1  Neck circumference greater than 40 cm/16 inches? 1 (17)  Gender: 0  Obstructive Sleep Apnea Score 4  Score 4 or greater  Results sent to PCP

## 2013-06-30 MED ORDER — DEXTROSE 5 % IV SOLN
3.0000 g | INTRAVENOUS | Status: AC
  Administered 2013-07-01: 3 g via INTRAVENOUS
  Filled 2013-06-30: qty 3000

## 2013-07-01 ENCOUNTER — Encounter (HOSPITAL_COMMUNITY)
Admission: RE | Disposition: A | Discharge status: Home / Self Care | Payer: Self-pay | Source: Ambulatory Visit | Attending: General Surgery

## 2013-07-01 ENCOUNTER — Ambulatory Visit (HOSPITAL_COMMUNITY): Payer: Medicare PPO | Admitting: Anesthesiology

## 2013-07-01 ENCOUNTER — Encounter (HOSPITAL_COMMUNITY): Payer: Self-pay | Admitting: *Deleted

## 2013-07-01 ENCOUNTER — Encounter (HOSPITAL_COMMUNITY)
Admission: RE | Admit: 2013-07-01 | Discharge: 2013-07-01 | Disposition: A | Discharge status: Home / Self Care | Payer: Medicare PPO | Source: Ambulatory Visit | Attending: General Surgery | Admitting: General Surgery

## 2013-07-01 ENCOUNTER — Encounter (HOSPITAL_COMMUNITY): Payer: Medicare PPO | Admitting: Anesthesiology

## 2013-07-01 ENCOUNTER — Ambulatory Visit (HOSPITAL_COMMUNITY)
Admission: RE | Admit: 2013-07-01 | Discharge: 2013-07-01 | Disposition: A | Discharge status: Home / Self Care | Payer: Medicare PPO | Source: Ambulatory Visit | Attending: General Surgery | Admitting: General Surgery

## 2013-07-01 ENCOUNTER — Ambulatory Visit
Admission: RE | Admit: 2013-07-01 | Discharge: 2013-07-01 | Disposition: A | Discharge status: Home / Self Care | Payer: Medicare PPO | Source: Ambulatory Visit | Attending: General Surgery | Admitting: General Surgery

## 2013-07-01 DIAGNOSIS — E119 Type 2 diabetes mellitus without complications: Secondary | ICD-10-CM | POA: Insufficient documentation

## 2013-07-01 DIAGNOSIS — D0590 Unspecified type of carcinoma in situ of unspecified breast: Secondary | ICD-10-CM | POA: Insufficient documentation

## 2013-07-01 DIAGNOSIS — D759 Disease of blood and blood-forming organs, unspecified: Secondary | ICD-10-CM | POA: Insufficient documentation

## 2013-07-01 DIAGNOSIS — D0511 Intraductal carcinoma in situ of right breast: Secondary | ICD-10-CM

## 2013-07-01 DIAGNOSIS — E785 Hyperlipidemia, unspecified: Secondary | ICD-10-CM | POA: Insufficient documentation

## 2013-07-01 DIAGNOSIS — E669 Obesity, unspecified: Secondary | ICD-10-CM | POA: Insufficient documentation

## 2013-07-01 DIAGNOSIS — I10 Essential (primary) hypertension: Secondary | ICD-10-CM | POA: Insufficient documentation

## 2013-07-01 DIAGNOSIS — Z87891 Personal history of nicotine dependence: Secondary | ICD-10-CM | POA: Insufficient documentation

## 2013-07-01 HISTORY — PX: BREAST LUMPECTOMY WITH NEEDLE LOCALIZATION AND AXILLARY SENTINEL LYMPH NODE BX: SHX5760

## 2013-07-01 LAB — GLUCOSE, CAPILLARY
GLUCOSE-CAPILLARY: 132 mg/dL — AB (ref 70–99)
GLUCOSE-CAPILLARY: 127 mg/dL — AB (ref 70–99)

## 2013-07-01 SURGERY — BREAST LUMPECTOMY WITH NEEDLE LOCALIZATION AND AXILLARY SENTINEL LYMPH NODE BX
Anesthesia: General | Site: Breast | Laterality: Right

## 2013-07-01 MED ORDER — CHLORHEXIDINE GLUCONATE 4 % EX LIQD
1.0000 "application " | Freq: Once | CUTANEOUS | Status: DC
  Filled 2013-07-01: qty 15

## 2013-07-01 MED ORDER — MIDAZOLAM HCL 5 MG/ML IJ SOLN: 2.0000 mg | Freq: Once | INTRAMUSCULAR | Status: DC

## 2013-07-01 MED ORDER — OXYCODONE-ACETAMINOPHEN 5-325 MG PO TABS: 1.0000 | ORAL_TABLET | ORAL | Status: DC | PRN

## 2013-07-01 MED ORDER — ONDANSETRON HCL 4 MG/2ML IJ SOLN
INTRAMUSCULAR | Status: AC
  Filled 2013-07-01: qty 2

## 2013-07-01 MED ORDER — FENTANYL CITRATE 0.05 MG/ML IJ SOLN
25.0000 ug | INTRAMUSCULAR | Status: DC | PRN
  Administered 2013-07-01: 50 ug via INTRAVENOUS
  Administered 2013-07-01: 25 ug via INTRAVENOUS

## 2013-07-01 MED ORDER — FENTANYL CITRATE 0.05 MG/ML IJ SOLN
INTRAMUSCULAR | Status: AC
  Filled 2013-07-01: qty 2

## 2013-07-01 MED ORDER — 0.9 % SODIUM CHLORIDE (POUR BTL) OPTIME
TOPICAL | Status: DC | PRN
  Administered 2013-07-01: 1000 mL

## 2013-07-01 MED ORDER — ONDANSETRON HCL 4 MG/2ML IJ SOLN
INTRAMUSCULAR | Status: DC | PRN
  Administered 2013-07-01: 4 mg via INTRAVENOUS

## 2013-07-01 MED ORDER — METOCLOPRAMIDE HCL 5 MG/ML IJ SOLN: 10.0000 mg | Freq: Once | INTRAMUSCULAR | Status: DC | PRN

## 2013-07-01 MED ORDER — GLYCOPYRROLATE 0.2 MG/ML IJ SOLN
INTRAMUSCULAR | Status: DC | PRN
  Administered 2013-07-01 (×2): 0.1 mg via INTRAVENOUS

## 2013-07-01 MED ORDER — FENTANYL CITRATE 0.05 MG/ML IJ SOLN
INTRAMUSCULAR | Status: DC | PRN
  Administered 2013-07-01: 100 ug via INTRAVENOUS
  Administered 2013-07-01 (×6): 25 ug via INTRAVENOUS

## 2013-07-01 MED ORDER — LIDOCAINE HCL (CARDIAC) 20 MG/ML IV SOLN
INTRAVENOUS | Status: DC | PRN
  Administered 2013-07-01: 30 mg via INTRAVENOUS

## 2013-07-01 MED ORDER — SODIUM CHLORIDE 0.9 % IJ SOLN
INTRAMUSCULAR | Status: AC
  Filled 2013-07-01: qty 10

## 2013-07-01 MED ORDER — MEPERIDINE HCL 25 MG/ML IJ SOLN: 6.2500 mg | INTRAMUSCULAR | Status: DC | PRN

## 2013-07-01 MED ORDER — FENTANYL CITRATE 0.05 MG/ML IJ SOLN
INTRAMUSCULAR | Status: AC
  Filled 2013-07-01: qty 5

## 2013-07-01 MED ORDER — BUPIVACAINE-EPINEPHRINE 0.25% -1:200000 IJ SOLN
INTRAMUSCULAR | Status: DC | PRN
  Administered 2013-07-01: 18 mL

## 2013-07-01 MED ORDER — MIDAZOLAM HCL 2 MG/2ML IJ SOLN
INTRAMUSCULAR | Status: AC
  Administered 2013-07-01: 2 mg
  Filled 2013-07-01: qty 2

## 2013-07-01 MED ORDER — LACTATED RINGERS IV SOLN
INTRAVENOUS | Status: DC
  Administered 2013-07-01: 10:00:00 via INTRAVENOUS

## 2013-07-01 MED ORDER — BUPIVACAINE-EPINEPHRINE (PF) 0.25% -1:200000 IJ SOLN
INTRAMUSCULAR | Status: AC
  Filled 2013-07-01: qty 30

## 2013-07-01 MED ORDER — SODIUM CHLORIDE 0.9 % IJ SOLN
INTRAMUSCULAR | Status: DC | PRN
  Administered 2013-07-01: 11:00:00

## 2013-07-01 MED ORDER — TECHNETIUM TC 99M SULFUR COLLOID FILTERED
1.0000 | Freq: Once | INTRAVENOUS | Status: AC | PRN
  Administered 2013-07-01: 1 via INTRADERMAL

## 2013-07-01 MED ORDER — FENTANYL CITRATE 0.05 MG/ML IJ SOLN
100.0000 ug | Freq: Once | INTRAMUSCULAR | Status: AC
  Administered 2013-07-01: 100 ug via INTRAVENOUS

## 2013-07-01 MED ORDER — METHYLENE BLUE 1 % INJ SOLN
INTRAMUSCULAR | Status: AC
  Filled 2013-07-01: qty 10

## 2013-07-01 MED ORDER — LIDOCAINE HCL (CARDIAC) 20 MG/ML IV SOLN
INTRAVENOUS | Status: AC
  Filled 2013-07-01: qty 5

## 2013-07-01 MED ORDER — MIDAZOLAM HCL 2 MG/2ML IJ SOLN
INTRAMUSCULAR | Status: AC
  Filled 2013-07-01: qty 2

## 2013-07-01 MED ORDER — GLYCOPYRROLATE 0.2 MG/ML IJ SOLN
INTRAMUSCULAR | Status: AC
  Filled 2013-07-01: qty 1

## 2013-07-01 MED ORDER — PROPOFOL 10 MG/ML IV BOLUS
INTRAVENOUS | Status: DC | PRN
  Administered 2013-07-01: 150 mg via INTRAVENOUS
  Administered 2013-07-01: 30 mg via INTRAVENOUS

## 2013-07-01 SURGICAL SUPPLY — 54 items
ADH SKN CLS APL DERMABOND .7 (GAUZE/BANDAGES/DRESSINGS) ×1
APPLIER CLIP 9.375 MED OPEN (MISCELLANEOUS)
APR CLP MED 9.3 20 MLT OPN (MISCELLANEOUS)
BINDER BREAST LRG (GAUZE/BANDAGES/DRESSINGS) IMPLANT
BINDER BREAST XLRG (GAUZE/BANDAGES/DRESSINGS) IMPLANT
BINDER BREAST XXLRG (GAUZE/BANDAGES/DRESSINGS) ×2 IMPLANT
CANISTER SUCTION 2500CC (MISCELLANEOUS) ×5 IMPLANT
CHLORAPREP W/TINT 26ML (MISCELLANEOUS) ×3 IMPLANT
CLIP APPLIE 9.375 MED OPEN (MISCELLANEOUS) IMPLANT
CONT SPEC 4OZ CLIKSEAL STRL BL (MISCELLANEOUS) ×5 IMPLANT
COVER PROBE W GEL 5X96 (DRAPES) ×3 IMPLANT
COVER SURGICAL LIGHT HANDLE (MISCELLANEOUS) ×3 IMPLANT
DERMABOND ADVANCED (GAUZE/BANDAGES/DRESSINGS) ×2
DERMABOND ADVANCED .7 DNX12 (GAUZE/BANDAGES/DRESSINGS) ×1 IMPLANT
DEVICE DISSECT PLASMABLAD 3.0S (MISCELLANEOUS) IMPLANT
DEVICE DUBIN SPECIMEN MAMMOGRA (MISCELLANEOUS) ×3 IMPLANT
DRAPE CHEST BREAST 15X10 FENES (DRAPES) ×3 IMPLANT
DRAPE UTILITY 15X26 W/TAPE STR (DRAPE) ×6 IMPLANT
ELECT COATED BLADE 2.86 ST (ELECTRODE) ×3 IMPLANT
ELECT REM PT RETURN 9FT ADLT (ELECTROSURGICAL) ×3
ELECTRODE REM PT RTRN 9FT ADLT (ELECTROSURGICAL) ×1 IMPLANT
GLOVE BIO SURGEON STRL SZ7.5 (GLOVE) ×3 IMPLANT
GLOVE BIOGEL PI IND STRL 6.5 (GLOVE) IMPLANT
GLOVE BIOGEL PI IND STRL 7.0 (GLOVE) IMPLANT
GLOVE BIOGEL PI INDICATOR 6.5 (GLOVE) ×2
GLOVE BIOGEL PI INDICATOR 7.0 (GLOVE) ×6
GLOVE SURG SS PI 7.0 STRL IVOR (GLOVE) ×4 IMPLANT
GOWN STRL REUS W/ TWL LRG LVL3 (GOWN DISPOSABLE) ×2 IMPLANT
GOWN STRL REUS W/TWL LRG LVL3 (GOWN DISPOSABLE) ×12
KIT BASIN OR (CUSTOM PROCEDURE TRAY) ×3 IMPLANT
KIT MARKER MARGIN INK (KITS) ×2 IMPLANT
KIT ROOM TURNOVER OR (KITS) ×3 IMPLANT
NDL 18GX1X1/2 (RX/OR ONLY) (NEEDLE) ×1 IMPLANT
NDL HYPO 25GX1X1/2 BEV (NEEDLE) ×2 IMPLANT
NEEDLE 18GX1X1/2 (RX/OR ONLY) (NEEDLE) ×3 IMPLANT
NEEDLE HYPO 25GX1X1/2 BEV (NEEDLE) ×6 IMPLANT
NS IRRIG 1000ML POUR BTL (IV SOLUTION) ×3 IMPLANT
PACK SURGICAL SETUP 50X90 (CUSTOM PROCEDURE TRAY) ×3 IMPLANT
PAD ARMBOARD 7.5X6 YLW CONV (MISCELLANEOUS) ×3 IMPLANT
PENCIL BUTTON HOLSTER BLD 10FT (ELECTRODE) ×3 IMPLANT
PLASMABLADE 3.0S (MISCELLANEOUS) ×3
SPONGE LAP 18X18 X RAY DECT (DISPOSABLE) ×3 IMPLANT
SUT MNCRL AB 4-0 PS2 18 (SUTURE) ×5 IMPLANT
SUT SILK 2 0 SH (SUTURE) IMPLANT
SUT VIC AB 3-0 54X BRD REEL (SUTURE) ×1 IMPLANT
SUT VIC AB 3-0 BRD 54 (SUTURE)
SUT VIC AB 3-0 SH 18 (SUTURE) ×3 IMPLANT
SYR BULB 3OZ (MISCELLANEOUS) ×3 IMPLANT
SYR CONTROL 10ML LL (SYRINGE) ×6 IMPLANT
TOWEL OR 17X24 6PK STRL BLUE (TOWEL DISPOSABLE) ×1 IMPLANT
TOWEL OR 17X26 10 PK STRL BLUE (TOWEL DISPOSABLE) ×3 IMPLANT
TUBE CONNECTING 12'X1/4 (SUCTIONS) ×2
TUBE CONNECTING 12X1/4 (SUCTIONS) ×3 IMPLANT
YANKAUER SUCT BULB TIP NO VENT (SUCTIONS) ×3 IMPLANT

## 2013-07-01 NOTE — Anesthesia Preprocedure Evaluation (Addendum)
Anesthesia Evaluation  Patient identified by MRN, date of birth, ID band Patient awake    Reviewed: Allergy & Precautions, H&P , NPO status , Patient's Chart, lab work & pertinent test results  Airway Mallampati: IV TM Distance: >3 FB Neck ROM: Full    Dental no notable dental hx. (+) Edentulous Upper, Poor Dentition   Pulmonary former smoker,  Snores- probable OSA-undiagnosed breath sounds clear to auscultation  Pulmonary exam normal       Cardiovascular hypertension, Pt. on medications and Pt. on home beta blockers + Valvular Problems/Murmurs Rhythm:Regular Rate:Normal     Neuro/Psych Diabetic Neuropathy negative neurological ROS  negative psych ROS   GI/Hepatic negative GI ROS, Neg liver ROS,   Endo/Other  diabetes, Well Controlled, Type 2, Oral Hypoglycemic AgentsDCIS right breast  Renal/GU Renal disease  negative genitourinary   Musculoskeletal negative musculoskeletal ROS (+)   Abdominal (+) + obese,   Peds  Hematology  (+) Blood dyscrasia, ,   Anesthesia Other Findings   Reproductive/Obstetrics negative OB ROS                          Anesthesia Physical Anesthesia Plan  ASA: III  Anesthesia Plan: General   Post-op Pain Management:    Induction: Intravenous  Airway Management Planned: LMA  Additional Equipment:   Intra-op Plan:   Post-operative Plan: Extubation in OR  Informed Consent: I have reviewed the patients History and Physical, chart, labs and discussed the procedure including the risks, benefits and alternatives for the proposed anesthesia with the patient or authorized representative who has indicated his/her understanding and acceptance.   Dental advisory given  Plan Discussed with: CRNA, Anesthesiologist and Surgeon  Anesthesia Plan Comments:         Anesthesia Quick Evaluation

## 2013-07-01 NOTE — Anesthesia Postprocedure Evaluation (Signed)
  Anesthesia Post-op Note  Patient: Theresa Adkins  Procedure(s) Performed: Procedure(s): BREAST LUMPECTOMY WITH NEEDLE LOCALIZATION AND AXILLARY SENTINEL LYMPH NODE BX (Right)  Patient Location: PACU  Anesthesia Type:General  Level of Consciousness: awake, alert  and oriented  Airway and Oxygen Therapy: Patient Spontanous Breathing  Post-op Pain: mild  Post-op Assessment: Post-op Vital signs reviewed, Patient's Cardiovascular Status Stable, Respiratory Function Stable, Patent Airway, No signs of Nausea or vomiting and Pain level controlled  Post-op Vital Signs: Reviewed and stable  Last Vitals:  Filed Vitals:   07/01/13 1315  BP: 141/75  Pulse: 80  Temp:   Resp: 16    Complications: No apparent anesthesia complications

## 2013-07-01 NOTE — Transfer of Care (Signed)
Immediate Anesthesia Transfer of Care Note  Patient: Theresa Adkins  Procedure(s) Performed: Procedure(s): BREAST LUMPECTOMY WITH NEEDLE LOCALIZATION AND AXILLARY SENTINEL LYMPH NODE BX (Right)  Patient Location: PACU  Anesthesia Type:General  Level of Consciousness: awake, alert  and oriented  Airway & Oxygen Therapy: Patient Spontanous Breathing and Patient connected to nasal cannula oxygen  Post-op Assessment: Report given to PACU RN, Post -op Vital signs reviewed and stable and Patient moving all extremities X 4  Post vital signs: Reviewed and stable  Complications: No apparent anesthesia complications

## 2013-07-01 NOTE — Anesthesia Procedure Notes (Signed)
Procedure Name: LMA Insertion Date/Time: 07/01/2013 11:03 AM Performed by: Carola Frost Pre-anesthesia Checklist: Patient identified, Emergency Drugs available, Suction available, Patient being monitored and Timeout performed Patient Re-evaluated:Patient Re-evaluated prior to inductionOxygen Delivery Method: Circle system utilized Preoxygenation: Pre-oxygenation with 100% oxygen Intubation Type: IV induction LMA: LMA inserted LMA Size: 4.0 Number of attempts: 1 Placement Confirmation: positive ETCO2 and breath sounds checked- equal and bilateral Tube secured with: Tape Dental Injury: Teeth and Oropharynx as per pre-operative assessment

## 2013-07-01 NOTE — Interval H&P Note (Signed)
History and Physical Interval Note:  07/01/2013 10:37 AM  Theresa Adkins  has presented today for surgery, with the diagnosis of right breast dcis   The various methods of treatment have been discussed with the patient and family. After consideration of risks, benefits and other options for treatment, the patient has consented to  Procedure(s): BREAST LUMPECTOMY WITH NEEDLE LOCALIZATION AND AXILLARY SENTINEL LYMPH NODE BX (Right) as a surgical intervention .  The patient's history has been reviewed, patient examined, no change in status, stable for surgery.  I have reviewed the patient's chart and labs.  Questions were answered to the patient's satisfaction.     Luella Cook III

## 2013-07-01 NOTE — H&P (View-Only) (Signed)
Patient ID: Theresa Adkins, female   DOB: 17-Jan-1946, 68 y.o.   MRN: [id number]  Chief Complaint  Patient presents with  . eval right breast    HPI Theresa Adkins is a 68 y.o. female.  We are asked to see the patient in consultation by Dr. Moshe Cipro to evaluate her for a right breast DCIS. The patient is a 68 year old black female who recently went for a routine screening mammogram. At that time she denies any breast pain or discharge from her nipple. She has no personal or family history of breast cancer. A 2.7 cm cluster of calcifications was seen in the upper outer right breast. This was biopsied and came back as DCIS. She was ER positive. She notes that she quit smoking approximately 11 years ago  HPI  Past Medical History  Diagnosis Date  . Allergic rhinitis, seasonal   . Dermatitis     feet  . Hidradenitis axillaris   . Obesity   . Diabetes mellitus type II   . Hyperlipidemia   . Abnormal EKG   . Tobacco abuse, in remission     30-40 pack years; quit in 2003    Past Surgical History  Procedure Laterality Date  . Breast lumpectomy  1970     Benign   . Colonoscopy w/ polypectomy  2010    moderate internal hemorrhoids; scattered diverticula; pathology-benign mucosa; tubular adenoma;no etiology for chronic diarrhea    Family History  Problem Relation Age of Onset  . Cancer Brother     bone   . Obesity Sister     both  . Heart disease Mother     Social History History  Substance Use Topics  . Smoking status: Former Research scientist (life sciences)  . Smokeless tobacco: Not on file  . Alcohol Use: No    Allergies  Allergen Reactions  . Ace Inhibitors Other (See Comments)    Cough  . Aspirin Nausea Only    Current Outpatient Prescriptions  Medication Sig Dispense Refill  . amLODipine (NORVASC) 5 MG tablet TAKE 1 TABLET BY MOUTH DAILY  90 tablet  1  . atorvastatin (LIPITOR) 40 MG tablet Take 1 tablet (40 mg total) by mouth daily.  90 tablet  11  . bisoprolol-hydrochlorothiazide (ZIAC)  10-6.25 MG per tablet TAKE ONE (1) TABLET EACH DAY  90 tablet  1  . Calcium (SUPER CALCIUM/D) 600-200 MG-UNIT per tablet Take 1 tablet by mouth daily.        Marland Kitchen FREESTYLE LITE test strip USE ONE STRIP UP TO FOUR TIMES DAILY  50 each  3  . metFORMIN (GLUCOPHAGE) 1000 MG tablet TAKE 1 TABLET BY MOUTH TWICE DAILY WITH A MEAL  180 tablet  1   No current facility-administered medications for this visit.    Review of Systems Review of Systems  Constitutional: Negative.   HENT: Negative.   Eyes: Negative.   Respiratory: Negative.   Cardiovascular: Negative.   Gastrointestinal: Negative.   Endocrine: Negative.   Genitourinary: Negative.   Musculoskeletal: Negative.   Skin: Negative.   Allergic/Immunologic: Negative.   Neurological: Negative.   Hematological: Negative.   Psychiatric/Behavioral: Negative.     Blood pressure 128/80, pulse 78, temperature 97.1 F (36.2 C), height 5\' 9"  (1.753 m), weight 281 lb 9.6 oz (127.733 kg).  Physical Exam Physical Exam  Constitutional: She is oriented to person, place, and time. She appears well-developed and well-nourished.  HENT:  Head: Normocephalic and atraumatic.  Eyes: Conjunctivae and EOM are normal. Pupils are equal, round,  and reactive to light.  Neck: Normal range of motion. Neck supple.  Cardiovascular: Normal rate, regular rhythm and normal heart sounds.   Pulmonary/Chest: Effort normal and breath sounds normal.  There is no palpable mass in either breast. There is no palpable axillary, supraclavicular, or cervical lymphadenopathy.  Abdominal: Soft. Bowel sounds are normal.  Musculoskeletal: Normal range of motion.  Lymphadenopathy:    She has no cervical adenopathy.  Neurological: She is alert and oriented to person, place, and time.  Skin: Skin is warm and dry.  Psychiatric: She has a normal mood and affect. Her behavior is normal.    Data Reviewed As above  Assessment    The patient has a 2.7 cm area of DCIS in the upper  outer right breast. Since she has a large breast I think she has the option of breast conservation versus mastectomy. I've spoke to her in detail about the different options for surgery and she favors breast conservation. She will require a wire localized lumpectomy and sentinel node mapping. I have discussed with her in detail the risks and benefits of the operation to do this as well as some of the technical aspects and she understands and wishes to proceed     Plan    Plan for right breast wire localized lumpectomy and sentinel node mapping        Luella Cook III 06/14/2013, 4:27 PM

## 2013-07-01 NOTE — Op Note (Signed)
07/01/2013  12:34 PM  PATIENT:  Theresa Adkins  68 y.o. female  PRE-OPERATIVE DIAGNOSIS:  RIGHT BREAST DCIS  POST-OPERATIVE DIAGNOSIS:  RIGHT BREAST DCIS  PROCEDURE:  Procedure(s): BREAST LUMPECTOMY WITH NEEDLE LOCALIZATION AND AXILLARY SENTINEL LYMPH NODE BX (Right) with injection blue dye  SURGEON:  Surgeon(s) and Role:    * Merrie Roof, MD - Primary  PHYSICIAN ASSISTANT:   ASSISTANTS: none   ANESTHESIA:   general  EBL:  Total I/O In: 800 [I.V.:800] Out: 25 [Blood:25]  BLOOD ADMINISTERED:none  DRAINS: none   LOCAL MEDICATIONS USED:  MARCAINE     SPECIMEN:  Source of Specimen:  right breast tissue and sentinel node X 2  DISPOSITION OF SPECIMEN:  PATHOLOGY  COUNTS:  YES  TOURNIQUET:  * No tourniquets in log *  DICTATION: .Dragon Dictation After informed consent was obtained the patient was brought to the operating room and placed in the supine position on the operating room table. After adequate induction of general anesthesia the patient's right chest, breast, and axillary area were prepped with ChloraPrep, allowed to dry, and draped in usual sterile manner. Earlier in the day the patient underwent injection of 1 mCi of technetium sulfur colloid in the subareolar position on the right. Also earlier in the day the patient underwent a wire localization procedure and the wire was entering the right breast in the upper outer quadrant and headed medially. At this point. 2 cc of methylene blue and 3 cc of injectable saline were also injected in the subareolar position the right and the breast was massaged for several minutes. A neoprobe device was used to identify hot spot in the right axilla and this area was marked on the skin. The wire was in the far upper outer quadrant and an ellipse of skin was removed during the incision overlying the wire. The incision was carried through the skin and subcutaneous tissue sharply with the plasma blade. Once into the breast tissue the  path of the wire could be palpated. A circular portion of breast tissue was excised sharply around the path of the wire. The dissection was carried all the way to the chest wall. Once this was accomplished the specimen was removed. The specimen was oriented with the appropriate paint colors. A specimen radiograph was obtained that showed the clip and wire to be in the center of the specimen. The specimen was then sent to pathology for further evaluation. Because the incision was in the far upper outer quadrant the location of the sentinel node was very close to the incision. We therefore dissected into the axilla through the incision with the plasma blade under the direction of the neoprobe. In doing so we were able to identify 2 hot blue lymph nodes. Each of these was excised sharply with the plasma blade and the lymphatics were controlled with clips. Ex vivo counts on both sentinel modes were approximately 3000. No other hot, blue, or palpable lymph nodes were identified in the right axilla. The wounds were then irrigated with saline and infiltrated with quarter percent Marcaine. The deep layer the wounds were closed with interrupted 3-0 Vicryl stitches. Skin was then closed with interrupted 4-0 Monocryl subcuticular stitches. Dermabond dressings were applied. The patient tolerated the procedure well. At the end of the case all needle sponge and instrument counts were correct. The patient was then awakened and taken to recovery in stable condition.  PLAN OF CARE: Discharge to home after PACU  PATIENT DISPOSITION:  PACU -  hemodynamically stable.   Delay start of Pharmacological VTE agent (>24hrs) due to surgical blood loss or risk of bleeding: not applicable

## 2013-07-02 ENCOUNTER — Encounter (HOSPITAL_COMMUNITY): Payer: Self-pay | Admitting: General Surgery

## 2013-07-12 ENCOUNTER — Other Ambulatory Visit: Payer: Self-pay | Admitting: Family Medicine

## 2013-07-13 LAB — LIPID PANEL
CHOL/HDL RATIO: 3.2 ratio
Cholesterol: 120 mg/dL (ref 0–200)
HDL: 37 mg/dL — ABNORMAL LOW (ref >39)
LDL Cholesterol: 44 mg/dL (ref 0–99)
Triglycerides: 195 mg/dL — ABNORMAL HIGH (ref <150)
VLDL: 39 mg/dL (ref 0–40)

## 2013-07-13 LAB — COMPLETE METABOLIC PANEL WITH GFR
ALK PHOS: 58 U/L (ref 39–117)
ALT: 10 U/L (ref 0–35)
AST: 10 U/L (ref 0–37)
Albumin: 4.2 g/dL (ref 3.5–5.2)
BUN: 7 mg/dL (ref 6–23)
CO2: 28 mEq/L (ref 19–32)
Calcium: 9.4 mg/dL (ref 8.4–10.5)
Chloride: 99 mEq/L (ref 96–112)
Creat: 0.77 mg/dL (ref 0.50–1.10)
GFR, EST NON AFRICAN AMERICAN: 80 mL/min
GFR, Est African American: >89 mL/min
GLUCOSE: 115 mg/dL — AB (ref 70–99)
Potassium: 4.3 mEq/L (ref 3.5–5.3)
SODIUM: 138 meq/L (ref 135–145)
Total Bilirubin: 0.6 mg/dL (ref 0.2–1.2)
Total Protein: 6.7 g/dL (ref 6.0–8.3)

## 2013-07-13 LAB — HEMOGLOBIN A1C
HEMOGLOBIN A1C: 7.1 % — AB (ref <5.7)
Mean Plasma Glucose: 157 mg/dL — ABNORMAL HIGH (ref <117)

## 2013-07-14 LAB — MICROALBUMIN / CREATININE URINE RATIO
Creatinine, Urine: 116.4 mg/dL
MICROALB UR: 2.63 mg/dL — AB (ref 0.00–1.89)
Microalb Creat Ratio: 22.6 mg/g (ref 0.0–30.0)

## 2013-07-15 ENCOUNTER — Encounter: Payer: Self-pay | Admitting: Family Medicine

## 2013-07-15 ENCOUNTER — Ambulatory Visit (INDEPENDENT_AMBULATORY_CARE_PROVIDER_SITE_OTHER): Payer: Medicare PPO | Admitting: Family Medicine

## 2013-07-15 VITALS — BP 130/76 | HR 68 | Resp 16 | Wt 273.0 lb

## 2013-07-15 DIAGNOSIS — I10 Essential (primary) hypertension: Secondary | ICD-10-CM

## 2013-07-15 DIAGNOSIS — D0511 Intraductal carcinoma in situ of right breast: Secondary | ICD-10-CM

## 2013-07-15 DIAGNOSIS — E785 Hyperlipidemia, unspecified: Secondary | ICD-10-CM

## 2013-07-15 DIAGNOSIS — E1121 Type 2 diabetes mellitus with diabetic nephropathy: Secondary | ICD-10-CM

## 2013-07-15 DIAGNOSIS — E669 Obesity, unspecified: Secondary | ICD-10-CM

## 2013-07-15 DIAGNOSIS — E1129 Type 2 diabetes mellitus with other diabetic kidney complication: Secondary | ICD-10-CM

## 2013-07-15 DIAGNOSIS — D0590 Unspecified type of carcinoma in situ of unspecified breast: Secondary | ICD-10-CM

## 2013-07-15 DIAGNOSIS — N058 Unspecified nephritic syndrome with other morphologic changes: Secondary | ICD-10-CM

## 2013-07-15 MED ORDER — BISOPROLOL-HYDROCHLOROTHIAZIDE 10-6.25 MG PO TABS: 1.0000 | ORAL_TABLET | Freq: Every day | ORAL | Status: DC

## 2013-07-15 MED ORDER — ATORVASTATIN CALCIUM 40 MG PO TABS: ORAL_TABLET | ORAL | Status: DC

## 2013-07-15 NOTE — Patient Instructions (Addendum)
F/u in 4 first week in October, call if you need me before  I am thankful that you have had succesful surgery and wish you all the best with treatment ahead, pls call if you neeed me  You are referred to Dr Gershon Crane for eye exam due next month  Congrats on weight loss  I recommend overnight oxygen test and sleep styudy, let me know when you decide on this  Foot exam is good  HBa1C chem 7 and EGFR TSH and microalb  End September

## 2013-07-16 NOTE — Progress Notes (Signed)
   Subjective:    Patient ID: Theresa Adkins, female    DOB: 1945/04/22, 68 y.o.   MRN: [id number]  HPI The PT is here for follow up and re-evaluation of chronic medical conditions, medication management and review of any available recent lab and radiology data.  Preventive health is updated, specifically  Cancer screening and Immunization.   Has had right lumpectomy for DCIS and will start radiation next week. She is extremely pleased with her care She is aware that her oxygen "was low" , I recommend both overnight pulse ox as well as sleep study, she declines both at this time The PT denies any adverse reactions to current medications since the last visit.  There are no new concerns.  There are no specific complaints       Review of Systems See HPI Denies recent fever or chills. Denies sinus pressure, nasal congestion, ear pain or sore throat. Denies chest congestion, productive cough or wheezing. Denies chest pains, palpitations and leg swelling Denies abdominal pain, nausea, vomiting,diarrhea or constipation.   Denies dysuria, frequency, hesitancy or incontinence. Denies joint pain, swelling and limitation in mobility. Denies headaches, seizures, numbness, or tingling. Denies depression, anxiety or insomnia.       Objective:   Physical Exam  BP 130/76  Pulse 68  Resp 16  Wt 273 lb (123.832 kg)  SpO2 96% Patient alert and oriented and in no cardiopulmonary distress.  HEENT: No facial asymmetry, EOMI, no sinus tenderness,  oropharynx pink and moist.  Neck supple no adenopathy.  Chest: Clear to auscultation bilaterally.  CVS: S1, S2 no murmurs, no S3.  ABD: Soft non tender. Bowel sounds normal.  Ext: No edema  MS: Adequate ROM spine, shoulders, hips and knees.  Skin: no ulcerations or rash noted.Lupectomy scar is well healed, fungal foot infection well controlled, no evidence of active disease  Psych: Good eye contact, normal affect. Memory intact not anxious  or depressed appearing.  CNS: CN 2-12 intact, power, tone and sensation normal throughout.       Assessment & Plan:  Diabetes mellitus with nephropathy Improved , no med change Patient advised to reduce carb and sweets, commit to regular physical activity, take meds as prescribed, test blood as directed, and attempt to lose weight, to improve blood sugar control.   HYPERTENSION Controlled, no change in medication DASH diet and commitment to daily physical activity for a minimum of 30 minutes discussed and encouraged, as a part of hypertension management. The importance of attaining a healthy weight is also discussed.   HYPERLIPIDEMIA Deteriorated, TG elevated Hyperlipidemia:Low fat diet discussed and encouraged.    OBESITY Improved. Pt applauded on succesful weight loss through lifestyle change, and encouraged to continue same. Weight loss goal set for the next several months.   Neoplasm of right breast, primary tumor staging category Tis: ductal carcinoma in situ (DCIS) Surgical scar well healed, pt to start radiation treatment

## 2013-07-16 NOTE — Assessment & Plan Note (Signed)
Deteriorated, TG elevated Hyperlipidemia:Low fat diet discussed and encouraged.

## 2013-07-16 NOTE — Assessment & Plan Note (Signed)
Controlled, no change in medication DASH diet and commitment to daily physical activity for a minimum of 30 minutes discussed and encouraged, as a part of hypertension management. The importance of attaining a healthy weight is also discussed.  

## 2013-07-16 NOTE — Assessment & Plan Note (Signed)
Improved, no med change Patient advised to reduce carb and sweets, commit to regular physical activity, take meds as prescribed, test blood as directed, and attempt to lose weight, to improve blood sugar control.  

## 2013-07-16 NOTE — Assessment & Plan Note (Signed)
Improved. Pt applauded on succesful weight loss through lifestyle change, and encouraged to continue same. Weight loss goal set for the next several months.  

## 2013-07-16 NOTE — Assessment & Plan Note (Signed)
Surgical scar well healed, pt to start radiation treatment

## 2013-07-20 ENCOUNTER — Encounter (INDEPENDENT_AMBULATORY_CARE_PROVIDER_SITE_OTHER): Payer: Self-pay | Admitting: General Surgery

## 2013-07-20 ENCOUNTER — Ambulatory Visit (INDEPENDENT_AMBULATORY_CARE_PROVIDER_SITE_OTHER): Payer: Medicare PPO | Admitting: General Surgery

## 2013-07-20 VITALS — BP 128/80 | HR 64 | Temp 97.8°F | Ht 69.0 in | Wt 273.0 lb

## 2013-07-20 DIAGNOSIS — D0590 Unspecified type of carcinoma in situ of unspecified breast: Secondary | ICD-10-CM

## 2013-07-20 DIAGNOSIS — D0511 Intraductal carcinoma in situ of right breast: Secondary | ICD-10-CM

## 2013-07-20 NOTE — Progress Notes (Signed)
Subjective:     Patient ID: Theresa Adkins, female   DOB: 1946/01/25, 68 y.o.   MRN: [id number]  HPI The patient is a 68 year old white female who is about 2 weeks status post right lumpectomy and negative sentinel node biopsy for DCIS. It measured 3.8 cm. It was ER positive and PR negative. She tolerated the surgery well.  Review of Systems     Objective:   Physical Exam On exam her right breast incision is healing nicely with no sign of infection or significant seroma    Assessment:     The patient is 2 weeks status post right lumpectomy for DCIS     Plan:     At this point I will refer to medical and radiation oncology for further adjuvant therapy. Will plan to see her back in about 3 months.

## 2013-07-20 NOTE — Patient Instructions (Signed)
Will refer to medical and radiation oncology

## 2013-07-21 ENCOUNTER — Telehealth: Payer: Self-pay | Admitting: *Deleted

## 2013-07-21 NOTE — Telephone Encounter (Signed)
Received referral and called pt and offered her to go to [facility] and she declined.  Wants to come here to [facility].  Confirmed 07/29/13 appt w/ pt.  Mailed before appt letter, welcoming packing & intake form to pt.  Emailed Arts development officer and Dr. Marlou Starks at [facility] to make them aware.  Took paperwork to [employer] in HIM to complete chart for Dr. Alen Blew.

## 2013-07-29 ENCOUNTER — Encounter: Payer: Self-pay | Admitting: Oncology

## 2013-07-29 ENCOUNTER — Telehealth: Payer: Self-pay | Admitting: Oncology

## 2013-07-29 ENCOUNTER — Ambulatory Visit: Payer: Medicare PPO

## 2013-07-29 ENCOUNTER — Ambulatory Visit (HOSPITAL_BASED_OUTPATIENT_CLINIC_OR_DEPARTMENT_OTHER): Payer: Medicare PPO | Admitting: Oncology

## 2013-07-29 ENCOUNTER — Encounter: Payer: Self-pay | Admitting: Radiation Oncology

## 2013-07-29 ENCOUNTER — Other Ambulatory Visit: Payer: Medicare PPO

## 2013-07-29 VITALS — BP 152/83 | HR 87 | Temp 97.9°F | Resp 18 | Wt 276.4 lb

## 2013-07-29 DIAGNOSIS — E119 Type 2 diabetes mellitus without complications: Secondary | ICD-10-CM

## 2013-07-29 DIAGNOSIS — D0511 Intraductal carcinoma in situ of right breast: Secondary | ICD-10-CM

## 2013-07-29 DIAGNOSIS — D0590 Unspecified type of carcinoma in situ of unspecified breast: Secondary | ICD-10-CM

## 2013-07-29 DIAGNOSIS — Z170 Estrogen receptor positive status [ER+]: Secondary | ICD-10-CM

## 2013-07-29 DIAGNOSIS — Z87891 Personal history of nicotine dependence: Secondary | ICD-10-CM

## 2013-07-29 NOTE — Progress Notes (Signed)
Location of Breast Cancer: Right Breast ,Upper Outer Quadrant  Histology per Pathology Report: Diagnosis 05/17/13:  Breast, right, needle core biopsy, upper outer quadrant- DUCTAL CARCINOMA IN SITU WITH COMEDO TYPE NECROSIS AND CALCIFICATIONS, SEE  Receptor Status: ER(+  ), PR ( neg), Her2-neu (   )  Did patient present with symptoms (if so, please note symptoms) or was this found on screening mammography?: Routine mammogram,  03/2013  Past/Anticipated interventions by surgeon, if any: Diagnosis 07/01/13: 1. Lymph node, sentinel, biopsy, Right axillary #1- THERE IS NO EVIDENCE OF CARCINOMA IN 1 OF 1 LYMPH NODE (0/1).2. Lymph node, sentinel, biopsy, Right axillary #2 - THERE IS NO EVIDENCE OF CARCINOMA IN 1 OF 1 LYMPH NODE (0/1).3. Breast, lumpectomy, Right- DUCTAL CARCINOMA IN SITU WITH CALCIFICATIONS, HIGH GRADE, SPANNING 3.8 CM.- DUCTAL CARCINOMA IN SITU IS FOCALLY 0.1 CM TO THE SUPERIOR MARGIN.- SEE ONCOLOGY TABLE BELOW.Microscopic Comment3. BREAST, IN SITU CARCINOMA, Dr. Sammuel Hines.Toth,III,MD  Past/Anticipated interventions by medical oncology, if any: Chemotherapy seen by Dr. Alen Blew 07/29/13  Lymphedema issues, if any: NO  Pain issues, if any:  Tenderness right breast  SAFETY ISSUES:  Prior radiation? No  Pacemaker/ICD? No  Possible current pregnancy? No  Is the patient on methotrexate? No  Current Complaints / other details:  Divorced, 1 child,  Hx Breast lumpectomy right Benign 1970, colonoscopy w/polypectomy, 2010, Diabetic type II, Heart murmur,, abnormal EKG,  former smoker1 ppd  Cigarettes 25 years,   Quit 2003, No alcohol or illicit drugs, no smokeless tobacco, HTN, Obesity Mother heart disease, brother bone cancer,    Haja Crego, Felicita Gage, RN 07/29/2013,2:55 PM

## 2013-07-29 NOTE — Consult Note (Signed)
Reason for Referral: DCIS of the right breast.   HPI: 68 year old African American woman currently of [place], [place] where she lived the majority of her life. She does have a past medical history significant for hypertension, diabetes and obesity. She does report a remote fibroid tumor of her right breast. On routine mammography she was found to have right breast calcification suspicious for malignancy in February of 2015. Diagnostic mammography on 04/21/2013 indicated that these new calcifications suspicious for malignancy. Initial biopsy on 05/17/2013 indicated ductal carcinoma in situ with comedo necrosis and calcification. (Case number [id number]). She subsequently underwent lumpectomy on 07/01/2013 and the pathology revealed ductal carcinoma in situ with calcification, high-grade spanning 3.8 cm and another lesion measuring 0.1 cm to the superior margin. Sentinel lymph node sampling showed zero out of two lymph nodes with malignancy. The pathological staging indicating Tis N0. The tumor found to be ER positive with 89% and PR negative. Patient recovered from her operation without any complications. Is here to discuss possible adjuvant therapy.  Clinically, she is asymptomatic. She does not report any headaches blurred vision double vision does not report any motor or sensory neuropathy. She does not report any change in her patient and or syncope. She does not report any fevers chills or sweats. Has not reported any weight loss or appetite changes. Is not reporting any chest pain cough or hemoptysis. Does not report any palpitation or leg edema. She has not reported any myocardial infarction or thrombosis. She does not report any nausea or vomiting or abdominal pain. She does not report any hematochezia or melena. She has not had any uterine hyperplasia or cancer. She had not had a hysterectomy. She does not report any skeletal complaints. She does not report any skin rashes or lesions. She  does not report any lymphadenopathy or coagulopathy. She continue to live independently with her son lives with her. Is able to drive and attention to activity of daily living.   Past Medical History  Diagnosis Date  . Allergic rhinitis, seasonal   . Dermatitis     feet  . Hidradenitis axillaris   . Obesity   . Diabetes mellitus type II   . Hyperlipidemia   . Abnormal EKG   . Tobacco abuse, in remission     30-40 pack years; quit in 2003  . Hypertension   . Heart murmur   . Cancer     breast rt  :  Past Surgical History  Procedure Laterality Date  . Colonoscopy w/ polypectomy  2010    moderate internal hemorrhoids; scattered diverticula; pathology-benign mucosa; tubular adenoma;no etiology for chronic diarrhea  . Breast lumpectomy  1970     Benign rt  . Breast lumpectomy with needle localization and axillary sentinel lymph node bx Right 07/01/2013    Procedure: BREAST LUMPECTOMY WITH NEEDLE LOCALIZATION AND AXILLARY SENTINEL LYMPH NODE BX;  Surgeon: Merrie Roof, MD;  Location: [place];  Service: General;  Laterality: Right;  :  Current Outpatient Prescriptions  Medication Sig Dispense Refill  . amLODipine (NORVASC) 5 MG tablet Take 5 mg by mouth daily.      Marland Kitchen atorvastatin (LIPITOR) 40 MG tablet TAKE 1 TABLET BY MOUTH DAILY  90 tablet  1  . bisoprolol-hydrochlorothiazide (ZIAC) 10-6.25 MG per tablet Take 1 tablet by mouth daily.  90 tablet  1  . FREESTYLE LITE test strip USE ONE STRIP UP TO FOUR TIMES DAILY  50 each  3  . metFORMIN (GLUCOPHAGE) 1000 MG tablet  Take 1,000 mg by mouth 2 (two) times daily with a meal.      . OVER THE COUNTER MEDICATION Take 1 tablet by mouth 2 (two) times daily. Calcium, Magnesium and Vitamin D       No current facility-administered medications for this visit.       Allergies  Allergen Reactions  . Ace Inhibitors Other (See Comments)    Cough  . Aspirin Nausea Only  :  Family History  Problem Relation Age of Onset  . Cancer Brother      bone   . Obesity Sister     both  . Heart disease Mother   :  History   Social History  . Marital Status: Divorced    Spouse Name: N/A    Number of Children: 1  . Years of Education: N/A   Occupational History  . Employed     Social History Main Topics  . Smoking status: Former Smoker -- 1.00 packs/day for 25 years    Types: Cigarettes    Quit date: 02/17/2002  . Smokeless tobacco: Not on file  . Alcohol Use: No  . Drug Use: No  . Sexual Activity: Not on file   Other Topics Concern  . Not on file   Social History Narrative  . No narrative on file  :  Pertinent items are noted in HPI.  Exam: ECOG 0 Blood pressure 152/83, pulse 87, temperature 97.9 F (36.6 C), temperature source Oral, resp. rate 18, weight 276 lb 6 oz (125.363 kg). General appearance: alert and cooperative Head: Normocephalic, without obvious abnormality Throat: lips, mucosa, and tongue normal; teeth and gums normal Neck: no adenopathy Resp: clear to auscultation bilaterally Chest wall: no tenderness Breasts: normal appearance, no masses or tenderness, Right-sided lumpectomy site appeared well healed. Cardio: regular rate and rhythm, S1, S2 normal, no murmur, click, rub or gallop GI: soft, non-tender; bowel sounds normal; no masses,  no organomegaly Extremities: extremities normal, atraumatic, no cyanosis or edema Pulses: 2+ and symmetric Skin: Skin color, texture, turgor normal. No rashes or lesions Lymph nodes: Cervical, supraclavicular, and axillary nodes normal. Neurologic: Grossly normal     Assessment and Plan:   68 year old woman with the diagnosis of right breast DCIS. Her tumor was discovered on routine mammography and status post lumpectomy and sentinel lymph node sampling. She was found to have a tumor spending 3.8 cm with necrosis and calcification. Lymph nodes were not involved of any invasive malignancy. Her tumor is ER positive and PR negative.  The natural course of this  disease was discussed today in detail with the patient. The next up and management his evaluation for adjuvant radiation therapy following her lumpectomy. This is based on the data from NSABP B-17.I will arrange for her to have a radiation oncology appointment.  I discussed with her the role of adjuvant hormone therapy in the setting of high risk DCIS. The rationale behind it is based on call called trials were notably NSABP  B 24. Tamoxifen has been the drug of choice not only to decrease and breast cancer recurrence also decreases the risk of contralateral breast recurrence. I have explained to her that this medicine will certainly decreases the incidence of breast cancer in her but certainly will not impact overall survival. Complication of this medication was discussed today including uterine hyperplasia, thromboembolism as well as hematological and gastrointestinal toxicity. Written information was given to the patient as well.  Alternative therapy to tamoxifen would be an aromatase inhibitor. These  showed were studied in breast cancer prevention and high risk patients but not necessarily exclusively and DCIS. But certainly can be utilized in this setting. New are data from ASCO 2015 indicating possible superiority of aromatase inhibitors in the form of Arimidex over tamoxifen. Especially in the setting of a woman with intact uterus, this might be an attractive option. But that will bring in osteoporosis among other complications into the picture as well.  The plan will be at this point is to refer for radiation evaluation followup upon completion of radiation to discuss the start of adjuvant endocrine therapy.  All her questions were answered today to her satisfaction.

## 2013-07-29 NOTE — Progress Notes (Signed)
Please see consult note.  

## 2013-07-29 NOTE — Telephone Encounter (Signed)
gv adn rpinted appt sched and avs for pt for June...pt sched to see Dr. Lisbeth Renshaw on 6.22 @ 8:30am...pt did not want to sched f/u with Dr. Alen Blew at this time will call back to sched

## 2013-07-29 NOTE — Progress Notes (Signed)
Checked in new pt with no financial concerns. °

## 2013-08-09 ENCOUNTER — Ambulatory Visit
Admission: RE | Admit: 2013-08-09 | Discharge: 2013-08-09 | Disposition: A | Discharge status: Home / Self Care | Payer: Medicare PPO | Source: Ambulatory Visit | Attending: Radiation Oncology | Admitting: Radiation Oncology

## 2013-08-09 ENCOUNTER — Encounter: Payer: Self-pay | Admitting: Radiation Oncology

## 2013-08-09 VITALS — BP 132/74 | HR 78 | Temp 98.3°F | Resp 20 | Ht 69.0 in | Wt 279.8 lb

## 2013-08-09 DIAGNOSIS — Z510 Encounter for antineoplastic radiation therapy: Secondary | ICD-10-CM | POA: Insufficient documentation

## 2013-08-09 DIAGNOSIS — D0590 Unspecified type of carcinoma in situ of unspecified breast: Secondary | ICD-10-CM | POA: Diagnosis not present

## 2013-08-09 DIAGNOSIS — C50412 Malignant neoplasm of upper-outer quadrant of left female breast: Secondary | ICD-10-CM

## 2013-08-09 DIAGNOSIS — C50411 Malignant neoplasm of upper-outer quadrant of right female breast: Secondary | ICD-10-CM

## 2013-08-09 DIAGNOSIS — D0511 Intraductal carcinoma in situ of right breast: Secondary | ICD-10-CM

## 2013-08-09 DIAGNOSIS — C50419 Malignant neoplasm of upper-outer quadrant of unspecified female breast: Secondary | ICD-10-CM | POA: Insufficient documentation

## 2013-08-09 HISTORY — DX: Malignant neoplasm of unspecified site of unspecified female breast: C50.919

## 2013-08-09 NOTE — Progress Notes (Signed)
Radiation Oncology         [phone] ________________________________  Name: Theresa Adkins MRN: [id number]  Date: 08/09/2013  DOB: 05/14/1945  BJ:[id number] Moshe Cipro, MD  Wyatt Portela, MD   Autumn Messing, MD  REFERRING PHYSICIAN: Wyatt Portela, MD   DIAGNOSIS: The primary encounter diagnosis was Neoplasm of right breast, primary tumor staging category Tis: ductal carcinoma in situ (DCIS). A diagnosis of Malignant neoplasm of upper-outer quadrant of female breast, right was also pertinent to this visit.   HISTORY OF PRESENT ILLNESS::Theresa Adkins is a 68 y.o. female who is seen for an initial consultation visit. The patient indicates that she did not notice anything suspicious within the right breast prior to routine mammography recently. She had suspicious calcifications and was then brought back for diagnostic mammography. Suspicious calcifications were confirmed and she proceeded to undergo a biopsy on 05/17/2013. This indicated ductal carcinoma in situ with comedonecrosis and calcification.  The patient was felt to be a good candidate for lumpectomy and she proceeded to undergo a lumpectomy on 07/01/2013. The final pathology revealed ductal carcinoma in situ with calcifications. This was a high-grade tumor measuring 3.8 cm. The margins were negative although ductal carcinoma in situ focally extended to 0.1 cm from the superior margin. Receptor studies have indicated that the tumor is estrogen receptor positive and progesterone receptor negative. 2 sentinel lymph nodes were examined with neither demonstrating carcinoma.  The patient indicates that she has done well postoperatively. She has been recovering without any unexpected difficulties. No infection.  The patient states that she had a remote history of removal of a benign tumor from the right breast. Otherwise no history of other surgeries or biopsies in either breast.  The patient has seen Dr. Alen Blew in medical oncology. He has  discussed with the patient the possibility of anti-hormonal treatment after a possible course of adjuvant radiation treatment.   PREVIOUS RADIATION THERAPY: No   PAST MEDICAL HISTORY:  has a past medical history of Allergic rhinitis, seasonal; Dermatitis; Hidradenitis axillaris; Obesity; Hyperlipidemia; Abnormal EKG; Tobacco abuse, in remission; Hypertension; Heart murmur; Cancer; Breast cancer; Allergy; Diabetes mellitus type II; and Neuromuscular disorder.     PAST SURGICAL HISTORY: Past Surgical History  Procedure Laterality Date  . Colonoscopy w/ polypectomy  2010    moderate internal hemorrhoids; scattered diverticula; pathology-benign mucosa; tubular adenoma;no etiology for chronic diarrhea  . Breast lumpectomy  1970     Benign rt  . Breast lumpectomy with needle localization and axillary sentinel lymph node bx Right 07/01/2013    Procedure: BREAST LUMPECTOMY WITH NEEDLE LOCALIZATION AND AXILLARY SENTINEL LYMPH NODE BX;  Surgeon: Merrie Roof, MD;  Location: [facility];  Service: General;  Laterality: Right;     FAMILY HISTORY: family history includes Cancer in her brother; Heart disease in her mother; Obesity in her sister.   SOCIAL HISTORY:  reports that she quit smoking about 11 years ago. Her smoking use included Cigarettes. She has a 25 pack-year smoking history. She does not have any smokeless tobacco history on file. She reports that she does not drink alcohol or use illicit drugs.   ALLERGIES: Ace inhibitors and Aspirin   MEDICATIONS:  Current Outpatient Prescriptions  Medication Sig Dispense Refill  . amLODipine (NORVASC) 5 MG tablet Take 5 mg by mouth daily.      Marland Kitchen atorvastatin (LIPITOR) 40 MG tablet TAKE 1 TABLET BY MOUTH DAILY  90 tablet  1  . bisoprolol-hydrochlorothiazide (ZIAC) 10-6.25 MG per tablet Take  1 tablet by mouth daily.  90 tablet  1  . FREESTYLE LITE test strip USE ONE STRIP UP TO FOUR TIMES DAILY  50 each  3  . metFORMIN (GLUCOPHAGE) 1000 MG tablet  Take 1,000 mg by mouth 2 (two) times daily with a meal.      . OVER THE COUNTER MEDICATION Take 1 tablet by mouth daily. Calcium, Magnesium and Vitamin D       No current facility-administered medications for this encounter.     REVIEW OF SYSTEMS:  A 15 point review of systems is documented in the electronic medical record. This was obtained by the nursing staff. However, I reviewed this with the patient to discuss relevant findings and make appropriate changes.  Pertinent items are noted in HPI.    PHYSICAL EXAM:  height is 5\' 9"  (1.753 m) and weight is 279 lb 12.8 oz (126.916 kg). Her oral temperature is 98.3 F (36.8 C). Her blood pressure is 132/74 and her pulse is 78. Her respiration is 20.   ECOG = 1  0 - Asymptomatic (Fully active, able to carry on all predisease activities without restriction)  1 - Symptomatic but completely ambulatory (Restricted in physically strenuous activity but ambulatory and able to carry out work of a light or sedentary nature. For example, light housework, office work)  2 - Symptomatic, <50% in bed during the day (Ambulatory and capable of all self care but unable to carry out any work activities. Up and about more than 50% of waking hours)  3 - Symptomatic, >50% in bed, but not bedbound (Capable of only limited self-care, confined to bed or chair 50% or more of waking hours)  4 - Bedbound (Completely disabled. Cannot carry on any self-care. Totally confined to bed or chair)  5 - Death   Eustace Pen MM, Creech RH, Tormey DC, et al. [phone]). "Toxicity and response criteria of the [facility]". [place] Oncol. 5 (6): 649-55  General: Well-developed, in no acute distress HEENT: Normocephalic, atraumatic; oral cavity clear Neck: Supple without any lymphadenopathy Cardiovascular: Regular rate and rhythm Respiratory: Clear to auscultation bilaterally Breasts:   Well-healed surgical incision within the upper outer quadrant of the right  breast. Well-healed axillary incision. No suspicious nodularity or masses. Unremarkable breast exam on the left and no axillary adenopathy on this side   GI: Soft, nontender, normal bowel sounds Extremities: No edema present Neuro: No focal deficits     LABORATORY DATA:  Lab Results  Component Value Date   WBC 6.8 06/25/2013   HGB 12.3 06/25/2013   HCT 38.4 06/25/2013   MCV 83.3 06/25/2013   PLT 317 06/25/2013   Lab Results  Component Value Date   NA 138 07/13/2013   K 4.3 07/13/2013   CL 99 07/13/2013   CO2 28 07/13/2013   Lab Results  Component Value Date   ALT 10 07/13/2013   AST 10 07/13/2013   ALKPHOS 58 07/13/2013   BILITOT 0.6 07/13/2013      RADIOGRAPHY: No results found.     IMPRESSION:  the patient is status post a lumpectomy for ductal carcinoma in situ of the right breast. The margins were negative. The patient is appropriate to proceed with adjuvant radiation treatment at this time.  I discussed with the patient my recommendation for adjuvant radiation. We discussed the rationale of this treatment in terms of improvement in local control. We also discussed the possible side effects and risks of treatment as well. We did  discuss the logistics of treatment including a possible hypo-fractionated/four-week course of treatment. I also discuss with her the possibility of a traditional 6-1/2 week course of treatment if we do not meet the technical criteria of her radiation planned for hyperfractionation. She is interested in a hypo-fractionated course if possible.   PLAN:  the patient will be scheduled for a simulation in the near future such that we can proceed with treatment planning. Again, I anticipate treating the patient if possible with a four-week course of treatment using whole breast tangent fields initially.       ________________________________   Jodelle Gross, MD, PhD   **Disclaimer: This note was dictated with voice recognition software. Similar sounding words  can inadvertently be transcribed and this note may contain transcription errors which may not have been corrected upon publication of note.**

## 2013-08-09 NOTE — Progress Notes (Signed)
Please see the Nurse Progress Note in the MD Initial Consult Encounter for this patient. 

## 2013-08-13 ENCOUNTER — Ambulatory Visit
Admission: RE | Admit: 2013-08-13 | Discharge: 2013-08-13 | Disposition: A | Discharge status: Home / Self Care | Payer: Medicare PPO | Source: Ambulatory Visit | Attending: Radiation Oncology | Admitting: Radiation Oncology

## 2013-08-13 DIAGNOSIS — Z510 Encounter for antineoplastic radiation therapy: Secondary | ICD-10-CM | POA: Diagnosis not present

## 2013-08-13 DIAGNOSIS — C50411 Malignant neoplasm of upper-outer quadrant of right female breast: Secondary | ICD-10-CM

## 2013-08-16 NOTE — Progress Notes (Signed)
  Radiation Oncology         [phone]) [phone] ________________________________  Name: Theresa Adkins MRN: [id number]  Date: 08/13/2013  DOB: 1945-03-01    SIMULATION AND TREATMENT PLANNING NOTE  The patient presented for simulation prior to beginning her course of radiation treatment for her diagnosis of right-sided breast cancer. The patient was placed in a prone position on a prone breast board. A customized accuform device was also constructed and this complex treatment device will be used on a daily basis during her treatment. In this fashion, a CT scan was obtained through the chest area and an isocenter was placed near the chest wall within the right breast.  The patient will be planned to receive a course of radiation initially to a dose of 42.5 gray. This will consist of a whole breast radiotherapy technique. To accomplish this, 2 customized blocks have been designed which will correspond to medial and lateral whole breast tangent fields. This treatment will be accomplished at 2.5 gray per fraction. A complex isodose plan is requested to ensure that the breast target area is adequately covered dosimetrically. A forward planning technique will also be evaluated to determine if this approach improves the plan. It is anticipated that the patient will then receive a 7.5 gray boost to the seroma cavity which has been contoured. This will be accomplished at 2 gray per fraction. The final anticipated total dose therefore will correspond to 50.0 gray.    _______________________________   Jodelle Gross, MD, PhD

## 2013-08-16 NOTE — Progress Notes (Signed)
  Radiation Oncology         [phone]) [phone] ________________________________  Name: Theresa Adkins MRN: [id number]  Date: 08/13/2013  DOB: [birth year]  Optical Surface Tracking Plan:  Since intensity modulated radiotherapy (IMRT) and 3D conformal radiation treatment methods are predicated on accurate and precise positioning for treatment, intrafraction motion monitoring is medically necessary to ensure accurate and safe treatment delivery.  The ability to quantify intrafraction motion without excessive ionizing radiation dose can only be performed with optical surface tracking. Accordingly, surface imaging offers the opportunity to obtain 3D measurements of patient position throughout IMRT and 3D treatments without excessive radiation exposure.  I am ordering optical surface tracking for this patient's upcoming course of radiotherapy. ________________________________  Marye Round, MD 08/16/2013 4:13 PM    Reference:   Ursula Alert, J, et al. Surface imaging-based analysis of intrafraction motion for breast radiotherapy patients.Journal of [place], n. 6, nov. 2014. ISSN 71696789.   Available at: <http://www.jacmp.org/index.php/jacmp/article/view/4957>.

## 2013-08-18 HISTORY — PX: BREAST LUMPECTOMY: SHX2

## 2013-08-19 DIAGNOSIS — Z510 Encounter for antineoplastic radiation therapy: Secondary | ICD-10-CM | POA: Diagnosis not present

## 2013-08-23 ENCOUNTER — Ambulatory Visit
Admission: RE | Admit: 2013-08-23 | Discharge: 2013-08-23 | Disposition: A | Discharge status: Home / Self Care | Payer: Medicare PPO | Source: Ambulatory Visit | Attending: Radiation Oncology | Admitting: Radiation Oncology

## 2013-08-23 DIAGNOSIS — C50412 Malignant neoplasm of upper-outer quadrant of left female breast: Secondary | ICD-10-CM

## 2013-08-23 DIAGNOSIS — Z510 Encounter for antineoplastic radiation therapy: Secondary | ICD-10-CM | POA: Diagnosis not present

## 2013-08-24 ENCOUNTER — Ambulatory Visit
Admission: RE | Admit: 2013-08-24 | Discharge: 2013-08-24 | Disposition: A | Discharge status: Home / Self Care | Payer: Medicare PPO | Source: Ambulatory Visit | Attending: Radiation Oncology | Admitting: Radiation Oncology

## 2013-08-24 VITALS — Wt 277.9 lb

## 2013-08-24 DIAGNOSIS — C50412 Malignant neoplasm of upper-outer quadrant of left female breast: Secondary | ICD-10-CM

## 2013-08-24 DIAGNOSIS — Z510 Encounter for antineoplastic radiation therapy: Secondary | ICD-10-CM | POA: Diagnosis not present

## 2013-08-24 MED ORDER — RADIAPLEXRX EX GEL
Freq: Once | CUTANEOUS | Status: AC
Start: 2013-08-24 — End: 2013-08-24
  Administered 2013-08-24: 14:00:00 via TOPICAL

## 2013-08-24 MED ORDER — ALRA NON-METALLIC DEODORANT (RAD-ONC)
1.0000 | Freq: Once | TOPICAL | Status: AC
Start: 2013-08-24 — End: 2013-08-24
  Administered 2013-08-24: 1 via TOPICAL

## 2013-08-24 NOTE — Progress Notes (Signed)
Patient education done, rad book, alra deodorant,radiaplex gel, my business card, flyer sheet on skin products given to patient, discussed skin irritation, fatigue, increase protein in diet, stay hydrated with water, discussed pain, sees MD/Staff RN weekly and prn ,can call for any concerns, all questions answered,teach back given 1:59 PM

## 2013-08-25 ENCOUNTER — Encounter: Payer: Self-pay | Admitting: Radiation Oncology

## 2013-08-25 ENCOUNTER — Ambulatory Visit
Admission: RE | Admit: 2013-08-25 | Discharge: 2013-08-25 | Disposition: A | Discharge status: Home / Self Care | Payer: Medicare PPO | Source: Ambulatory Visit | Attending: Radiation Oncology | Admitting: Radiation Oncology

## 2013-08-25 DIAGNOSIS — Z510 Encounter for antineoplastic radiation therapy: Secondary | ICD-10-CM | POA: Diagnosis not present

## 2013-08-26 ENCOUNTER — Ambulatory Visit
Admission: RE | Admit: 2013-08-26 | Discharge: 2013-08-26 | Disposition: A | Discharge status: Home / Self Care | Payer: Medicare PPO | Source: Ambulatory Visit | Attending: Radiation Oncology | Admitting: Radiation Oncology

## 2013-08-26 DIAGNOSIS — Z510 Encounter for antineoplastic radiation therapy: Secondary | ICD-10-CM | POA: Diagnosis not present

## 2013-08-27 ENCOUNTER — Ambulatory Visit
Admission: RE | Admit: 2013-08-27 | Discharge: 2013-08-27 | Disposition: A | Discharge status: Home / Self Care | Payer: Medicare PPO | Source: Ambulatory Visit | Attending: Radiation Oncology | Admitting: Radiation Oncology

## 2013-08-27 ENCOUNTER — Ambulatory Visit: Payer: Medicare PPO | Admitting: Radiation Oncology

## 2013-08-27 ENCOUNTER — Encounter: Payer: Self-pay | Admitting: Radiation Oncology

## 2013-08-27 VITALS — BP 142/70 | HR 79 | Temp 97.7°F | Resp 20 | Wt 277.3 lb

## 2013-08-27 DIAGNOSIS — Z510 Encounter for antineoplastic radiation therapy: Secondary | ICD-10-CM | POA: Diagnosis not present

## 2013-08-27 DIAGNOSIS — C50412 Malignant neoplasm of upper-outer quadrant of left female breast: Secondary | ICD-10-CM

## 2013-08-27 NOTE — Progress Notes (Signed)
   Department of Radiation Oncology  Phone:  [phone] Fax:        [phone]  Weekly Treatment Note    Name: Theresa Adkins Date: 08/27/2013 MRN: [id number] DOB: 03/17/1945   Current dose: 10 Gy  Current fraction: 4   MEDICATIONS: Current Outpatient Prescriptions  Medication Sig Dispense Refill  . amLODipine (NORVASC) 5 MG tablet Take 5 mg by mouth daily.      Marland Kitchen atorvastatin (LIPITOR) 40 MG tablet TAKE 1 TABLET BY MOUTH DAILY  90 tablet  1  . bisoprolol-hydrochlorothiazide (ZIAC) 10-6.25 MG per tablet Take 1 tablet by mouth daily.  90 tablet  1  . FREESTYLE LITE test strip USE ONE STRIP UP TO FOUR TIMES DAILY  50 each  3  . hyaluronate sodium (RADIAPLEXRX) GEL Apply 1 application topically 2 (two) times daily. Apply daily after rad tx and bedtime  And on weekends to affected breast area      . metFORMIN (GLUCOPHAGE) 1000 MG tablet Take 1,000 mg by mouth 2 (two) times daily with a meal.      . non-metallic deodorant (ALRA) MISC Apply 1 application topically daily as needed.      Marland Kitchen OVER THE COUNTER MEDICATION Take 1 tablet by mouth daily. Calcium, Magnesium and Vitamin D       No current facility-administered medications for this encounter.     ALLERGIES: Ace inhibitors and Aspirin   LABORATORY DATA:  Lab Results  Component Value Date   WBC 6.8 06/25/2013   HGB 12.3 06/25/2013   HCT 38.4 06/25/2013   MCV 83.3 06/25/2013   PLT 317 06/25/2013   Lab Results  Component Value Date   NA 138 07/13/2013   K 4.3 07/13/2013   CL 99 07/13/2013   CO2 28 07/13/2013   Lab Results  Component Value Date   ALT 10 07/13/2013   AST 10 07/13/2013   ALKPHOS 58 07/13/2013   BILITOT 0.6 07/13/2013     NARRATIVE: Theresa Adkins was seen today for weekly treatment management. The chart was checked and the patient's films were reviewed. The patient is doing very well in her first week of treatment. No complaints. She is using skin cream daily.  PHYSICAL EXAMINATION: weight is 277 lb 4.8 oz  (125.782 kg). Her oral temperature is 97.7 F (36.5 C). Her blood pressure is 142/70 and her pulse is 79. Her respiration is 20.        ASSESSMENT: The patient is doing satisfactorily with treatment.  PLAN: We will continue with the patient's radiation treatment as planned.

## 2013-08-27 NOTE — Progress Notes (Signed)
Weekly rad tx right breast 4 completed no skin changes,  Has radiaplex to apply bid , no pain stated 8:19 AM

## 2013-08-30 ENCOUNTER — Ambulatory Visit
Admission: RE | Admit: 2013-08-30 | Discharge: 2013-08-30 | Disposition: A | Discharge status: Home / Self Care | Payer: Medicare PPO | Source: Ambulatory Visit | Attending: Radiation Oncology | Admitting: Radiation Oncology

## 2013-08-30 ENCOUNTER — Ambulatory Visit: Payer: Medicare PPO | Admitting: Radiation Oncology

## 2013-08-30 ENCOUNTER — Ambulatory Visit: Admission: RE | Admit: 2013-08-30 | Payer: Medicare PPO | Source: Ambulatory Visit | Admitting: Radiation Oncology

## 2013-08-30 DIAGNOSIS — Z510 Encounter for antineoplastic radiation therapy: Secondary | ICD-10-CM | POA: Diagnosis not present

## 2013-08-31 ENCOUNTER — Ambulatory Visit
Admission: RE | Admit: 2013-08-31 | Discharge: 2013-08-31 | Disposition: A | Discharge status: Home / Self Care | Payer: Medicare PPO | Source: Ambulatory Visit | Attending: Radiation Oncology | Admitting: Radiation Oncology

## 2013-08-31 DIAGNOSIS — Z510 Encounter for antineoplastic radiation therapy: Secondary | ICD-10-CM | POA: Diagnosis not present

## 2013-09-01 ENCOUNTER — Ambulatory Visit
Admission: RE | Admit: 2013-09-01 | Discharge: 2013-09-01 | Disposition: A | Discharge status: Home / Self Care | Payer: Medicare PPO | Source: Ambulatory Visit | Attending: Radiation Oncology | Admitting: Radiation Oncology

## 2013-09-01 DIAGNOSIS — Z510 Encounter for antineoplastic radiation therapy: Secondary | ICD-10-CM | POA: Diagnosis not present

## 2013-09-02 ENCOUNTER — Ambulatory Visit
Admission: RE | Admit: 2013-09-02 | Discharge: 2013-09-02 | Disposition: A | Discharge status: Home / Self Care | Payer: Medicare PPO | Source: Ambulatory Visit | Attending: Radiation Oncology | Admitting: Radiation Oncology

## 2013-09-02 DIAGNOSIS — Z510 Encounter for antineoplastic radiation therapy: Secondary | ICD-10-CM | POA: Diagnosis not present

## 2013-09-03 ENCOUNTER — Ambulatory Visit
Admission: RE | Admit: 2013-09-03 | Discharge: 2013-09-03 | Disposition: A | Discharge status: Home / Self Care | Payer: Medicare PPO | Source: Ambulatory Visit | Attending: Radiation Oncology | Admitting: Radiation Oncology

## 2013-09-03 ENCOUNTER — Encounter: Payer: Self-pay | Admitting: Radiation Oncology

## 2013-09-03 VITALS — BP 140/61 | HR 67 | Temp 98.6°F | Resp 20 | Wt 277.8 lb

## 2013-09-03 DIAGNOSIS — C50412 Malignant neoplasm of upper-outer quadrant of left female breast: Secondary | ICD-10-CM

## 2013-09-03 DIAGNOSIS — Z510 Encounter for antineoplastic radiation therapy: Secondary | ICD-10-CM | POA: Diagnosis not present

## 2013-09-03 NOTE — Progress Notes (Signed)
  Radiation Oncology         [phone]) [phone] ________________________________  Name: Theresa Adkins MRN: [id number]  Date: 09/03/2013  DOB: [birth year]  Weekly Radiation Therapy Management  Current Dose: 22.5 Gy     Planned Dose:  42.5 Gy  Narrative . . . . . . . . The patient presents for routine under treatment assessment.                                   The patient is without complaint. Weekly rad txs right breast 9 completed so far, using radiaplex bid, no c/o pain,nausea, energy level good, no c/o pain                                 Set-up films were reviewed.                                 The chart was checked. Physical Findings. . .  weight is 277 lb 12.8 oz (126.009 kg). Her oral temperature is 98.6 F (37 C). Her blood pressure is 140/61 and her pulse is 67. Her respiration is 20. .slight erythema on breast,skin intact,  Weight essentially stable.  No significant changes. Impression . . . . . . . The patient is tolerating radiation. Plan . . . . . . . . . . . . Continue treatment as planned.  ________________________________  [name]. [name], M.D.

## 2013-09-03 NOTE — Progress Notes (Signed)
Weekly rad txs right breast 9 completed so far, slight erythema on breast,skin intact, using radiaplex bid, no c/o pain,nausea, energy level good, no c/o pain 11:00 AM

## 2013-09-06 ENCOUNTER — Ambulatory Visit
Admission: RE | Admit: 2013-09-06 | Discharge: 2013-09-06 | Disposition: A | Discharge status: Home / Self Care | Payer: Medicare PPO | Source: Ambulatory Visit | Attending: Radiation Oncology | Admitting: Radiation Oncology

## 2013-09-06 DIAGNOSIS — Z510 Encounter for antineoplastic radiation therapy: Secondary | ICD-10-CM | POA: Diagnosis not present

## 2013-09-07 ENCOUNTER — Ambulatory Visit
Admission: RE | Admit: 2013-09-07 | Discharge: 2013-09-07 | Disposition: A | Discharge status: Home / Self Care | Payer: Medicare PPO | Source: Ambulatory Visit | Attending: Radiation Oncology | Admitting: Radiation Oncology

## 2013-09-07 DIAGNOSIS — Z510 Encounter for antineoplastic radiation therapy: Secondary | ICD-10-CM | POA: Diagnosis not present

## 2013-09-08 ENCOUNTER — Ambulatory Visit
Admission: RE | Admit: 2013-09-08 | Discharge: 2013-09-08 | Disposition: A | Discharge status: Home / Self Care | Payer: Medicare PPO | Source: Ambulatory Visit | Attending: Radiation Oncology | Admitting: Radiation Oncology

## 2013-09-08 DIAGNOSIS — Z510 Encounter for antineoplastic radiation therapy: Secondary | ICD-10-CM | POA: Diagnosis not present

## 2013-09-09 ENCOUNTER — Ambulatory Visit
Admission: RE | Admit: 2013-09-09 | Discharge: 2013-09-09 | Disposition: A | Discharge status: Home / Self Care | Payer: Medicare PPO | Source: Ambulatory Visit | Attending: Radiation Oncology | Admitting: Radiation Oncology

## 2013-09-09 DIAGNOSIS — Z510 Encounter for antineoplastic radiation therapy: Secondary | ICD-10-CM | POA: Diagnosis not present

## 2013-09-10 ENCOUNTER — Ambulatory Visit
Admission: RE | Admit: 2013-09-10 | Discharge: 2013-09-10 | Disposition: A | Discharge status: Home / Self Care | Payer: Medicare PPO | Source: Ambulatory Visit | Attending: Radiation Oncology | Admitting: Radiation Oncology

## 2013-09-10 ENCOUNTER — Ambulatory Visit: Payer: Medicare PPO | Admitting: Radiation Oncology

## 2013-09-10 VITALS — BP 143/59 | HR 70 | Temp 98.3°F | Resp 12 | Wt 278.3 lb

## 2013-09-10 DIAGNOSIS — Z510 Encounter for antineoplastic radiation therapy: Secondary | ICD-10-CM | POA: Diagnosis not present

## 2013-09-10 DIAGNOSIS — C50412 Malignant neoplasm of upper-outer quadrant of left female breast: Secondary | ICD-10-CM

## 2013-09-10 DIAGNOSIS — D0511 Intraductal carcinoma in situ of right breast: Secondary | ICD-10-CM

## 2013-09-10 MED ORDER — BIAFINE EX EMUL: Freq: Two times a day (BID) | CUTANEOUS | Status: DC

## 2013-09-10 NOTE — Progress Notes (Signed)
   Department of Radiation Oncology  Phone:  [phone] Fax:        [phone]  Weekly Treatment Note    Name: Theresa Adkins Date: 09/10/2013 MRN: [id number] DOB: 26-Aug-1945   Current dose: 35 Gy  Current fraction: 14   MEDICATIONS: Current Outpatient Prescriptions  Medication Sig Dispense Refill  . amLODipine (NORVASC) 5 MG tablet Take 5 mg by mouth daily.      Marland Kitchen atorvastatin (LIPITOR) 40 MG tablet TAKE 1 TABLET BY MOUTH DAILY  90 tablet  1  . bisoprolol-hydrochlorothiazide (ZIAC) 10-6.25 MG per tablet Take 1 tablet by mouth daily.  90 tablet  1  . FREESTYLE LITE test strip USE ONE STRIP UP TO FOUR TIMES DAILY  50 each  3  . hyaluronate sodium (RADIAPLEXRX) GEL Apply 1 application topically 2 (two) times daily. Apply daily after rad tx and bedtime  And on weekends to affected breast area      . metFORMIN (GLUCOPHAGE) 1000 MG tablet Take 1,000 mg by mouth 2 (two) times daily with a meal.      . non-metallic deodorant (ALRA) MISC Apply 1 application topically daily as needed.      Marland Kitchen OVER THE COUNTER MEDICATION Take 1 tablet by mouth daily. Calcium, Magnesium and Vitamin D       Current Facility-Administered Medications  Medication Dose Route Frequency Provider Last Rate Last Dose  . topical emolient (BIAFINE) emulsion   Topical BID Marye Round, MD         ALLERGIES: Ace inhibitors and Aspirin   LABORATORY DATA:  Lab Results  Component Value Date   WBC 6.8 06/25/2013   HGB 12.3 06/25/2013   HCT 38.4 06/25/2013   MCV 83.3 06/25/2013   PLT 317 06/25/2013   Lab Results  Component Value Date   NA 138 07/13/2013   K 4.3 07/13/2013   CL 99 07/13/2013   CO2 28 07/13/2013   Lab Results  Component Value Date   ALT 10 07/13/2013   AST 10 07/13/2013   ALKPHOS 58 07/13/2013   BILITOT 0.6 07/13/2013     NARRATIVE: Theresa Adkins was seen today for weekly treatment management. The chart was checked and the patient's films were reviewed. The patient is doing very well. She  denies any real change in terms of how her skin feels in the treatment area. No new complaints today.  PHYSICAL EXAMINATION: weight is 278 lb 4.8 oz (126.236 kg). Her oral temperature is 98.3 F (36.8 C). Her blood pressure is 143/59 and her pulse is 70. Her respiration is 12 and oxygen saturation is 97%.      mild hyperpigmentation in the treatment area. No desquamation.  ASSESSMENT: The patient is doing satisfactorily with treatment.  PLAN: We will continue with the patient's radiation treatment as planned.

## 2013-09-10 NOTE — Progress Notes (Addendum)
She rates her pain as a 1 on a scale of 0-10, sensations in right breast. Pt complains of skin sensitiveness and itching over arms and legs, reports it is from her diabetes.  Pt right breast negative for rash, or edema.  Pt reports swelling over bilateral lower extremities, denies pain for dorsal flexion and extension. Radiaplex given per patients request.  The patient eats a regular, healthy diet.

## 2013-09-13 ENCOUNTER — Ambulatory Visit
Admission: RE | Admit: 2013-09-13 | Discharge: 2013-09-13 | Disposition: A | Discharge status: Home / Self Care | Payer: Medicare PPO | Source: Ambulatory Visit | Attending: Radiation Oncology | Admitting: Radiation Oncology

## 2013-09-13 DIAGNOSIS — Z510 Encounter for antineoplastic radiation therapy: Secondary | ICD-10-CM | POA: Diagnosis not present

## 2013-09-14 ENCOUNTER — Ambulatory Visit
Admission: RE | Admit: 2013-09-14 | Discharge: 2013-09-14 | Disposition: A | Discharge status: Home / Self Care | Payer: Medicare PPO | Source: Ambulatory Visit | Attending: Radiation Oncology | Admitting: Radiation Oncology

## 2013-09-14 DIAGNOSIS — Z510 Encounter for antineoplastic radiation therapy: Secondary | ICD-10-CM | POA: Diagnosis not present

## 2013-09-15 ENCOUNTER — Ambulatory Visit
Admission: RE | Admit: 2013-09-15 | Discharge: 2013-09-15 | Disposition: A | Discharge status: Home / Self Care | Payer: Medicare PPO | Source: Ambulatory Visit | Attending: Radiation Oncology | Admitting: Radiation Oncology

## 2013-09-15 DIAGNOSIS — Z510 Encounter for antineoplastic radiation therapy: Secondary | ICD-10-CM | POA: Diagnosis not present

## 2013-09-16 ENCOUNTER — Ambulatory Visit
Admission: RE | Admit: 2013-09-16 | Discharge: 2013-09-16 | Disposition: A | Discharge status: Home / Self Care | Payer: Medicare PPO | Source: Ambulatory Visit | Attending: Radiation Oncology | Admitting: Radiation Oncology

## 2013-09-16 DIAGNOSIS — Z510 Encounter for antineoplastic radiation therapy: Secondary | ICD-10-CM | POA: Diagnosis not present

## 2013-09-16 DIAGNOSIS — C50411 Malignant neoplasm of upper-outer quadrant of right female breast: Secondary | ICD-10-CM

## 2013-09-17 ENCOUNTER — Ambulatory Visit
Admission: RE | Admit: 2013-09-17 | Discharge: 2013-09-17 | Disposition: A | Discharge status: Home / Self Care | Payer: Medicare PPO | Source: Ambulatory Visit | Attending: Radiation Oncology | Admitting: Radiation Oncology

## 2013-09-17 ENCOUNTER — Ambulatory Visit: Payer: Medicare PPO

## 2013-09-17 VITALS — BP 137/60 | HR 81 | Temp 98.0°F | Resp 12 | Wt 279.0 lb

## 2013-09-17 DIAGNOSIS — Z510 Encounter for antineoplastic radiation therapy: Secondary | ICD-10-CM | POA: Diagnosis not present

## 2013-09-17 DIAGNOSIS — C50412 Malignant neoplasm of upper-outer quadrant of left female breast: Secondary | ICD-10-CM

## 2013-09-17 NOTE — Progress Notes (Signed)
She is currently in no pain. Pt reports she noticed some skin irritation under her Right breast fold and axilla.   She continues to apply Radiaplex as directed. Noted some moderate edema over bilateral lower extremities.  She denies pain on dorsal flexion and extension. . The patient eats a regular, healthy diet.Marland Kitchen

## 2013-09-17 NOTE — Progress Notes (Signed)
   Department of Radiation Oncology  Phone:  [phone] Fax:        [phone]  Weekly Treatment Note    Name: Theresa Adkins Date: 09/17/2013 MRN: [id number] DOB: [birth year]   Current dose: 47.5 Gy  Current fraction: 19   MEDICATIONS: Current Outpatient Prescriptions  Medication Sig Dispense Refill  . amLODipine (NORVASC) 5 MG tablet Take 5 mg by mouth daily.      Marland Kitchen atorvastatin (LIPITOR) 40 MG tablet TAKE 1 TABLET BY MOUTH DAILY  90 tablet  1  . bisoprolol-hydrochlorothiazide (ZIAC) 10-6.25 MG per tablet Take 1 tablet by mouth daily.  90 tablet  1  . FREESTYLE LITE test strip USE ONE STRIP UP TO FOUR TIMES DAILY  50 each  3  . hyaluronate sodium (RADIAPLEXRX) GEL Apply 1 application topically 2 (two) times daily. Apply daily after rad tx and bedtime  And on weekends to affected breast area      . metFORMIN (GLUCOPHAGE) 1000 MG tablet Take 1,000 mg by mouth 2 (two) times daily with a meal.      . non-metallic deodorant (ALRA) MISC Apply 1 application topically daily as needed.      Marland Kitchen OVER THE COUNTER MEDICATION Take 1 tablet by mouth daily. Calcium, Magnesium and Vitamin D       No current facility-administered medications for this encounter.     ALLERGIES: Ace inhibitors and Aspirin   LABORATORY DATA:  Lab Results  Component Value Date   WBC 6.8 06/25/2013   HGB 12.3 06/25/2013   HCT 38.4 06/25/2013   MCV 83.3 06/25/2013   PLT 317 06/25/2013   Lab Results  Component Value Date   NA 138 07/13/2013   K 4.3 07/13/2013   CL 99 07/13/2013   CO2 28 07/13/2013   Lab Results  Component Value Date   ALT 10 07/13/2013   AST 10 07/13/2013   ALKPHOS 58 07/13/2013   BILITOT 0.6 07/13/2013     NARRATIVE: Theresa Adkins was seen today for weekly treatment management. The chart was checked and the patient's films were reviewed. The patient states that she is doing very well. She has one more fraction remaining. The patient does continue to use skin cream each day. No major  changes.  PHYSICAL EXAMINATION: weight is 279 lb (126.554 kg). Her oral temperature is 98 F (36.7 C). Her blood pressure is 137/60 and her pulse is 81. Her respiration is 12 and oxygen saturation is 99%.      some hyperpigmentation without desquamation  ASSESSMENT: The patient is doing satisfactorily with treatment.  PLAN: We will continue with the patient's radiation treatment as planned. She will followup in our clinic in one month.

## 2013-09-19 ENCOUNTER — Other Ambulatory Visit: Payer: Self-pay | Admitting: Family Medicine

## 2013-09-20 ENCOUNTER — Ambulatory Visit
Admission: RE | Admit: 2013-09-20 | Discharge: 2013-09-20 | Disposition: A | Discharge status: Home / Self Care | Payer: Medicare PPO | Source: Ambulatory Visit | Attending: Radiation Oncology | Admitting: Radiation Oncology

## 2013-09-20 ENCOUNTER — Encounter: Payer: Self-pay | Admitting: Radiation Oncology

## 2013-09-20 DIAGNOSIS — Z510 Encounter for antineoplastic radiation therapy: Secondary | ICD-10-CM | POA: Diagnosis not present

## 2013-09-30 NOTE — Progress Notes (Signed)
  Radiation Oncology         [phone]) [phone] ________________________________  Name: Theresa Adkins MRN: [id number]  Date: 08/23/2013  DOB: [birth year]  Simulation Verification Note   NARRATIVE: The patient was brought to the treatment unit and placed in the planned treatment position. The clinical setup was verified. Then port films were obtained and uploaded to the radiation oncology medical record software.  The treatment beams were carefully compared against the planned radiation fields. The position, location, and shape of the radiation fields was reviewed. The targeted volume of tissue appears to be appropriately covered by the radiation beams. Based on my personal review, I approved the simulation verification. The patient's treatment will proceed as planned.  ________________________________   Jodelle Gross, MD, PhD

## 2013-09-30 NOTE — Progress Notes (Signed)
  Radiation Oncology         [phone]) [phone] ________________________________  Name: Theresa Adkins MRN: [id number]  Date: 09/16/2013  DOB: [birth year]  Simulation Verification Note   NARRATIVE: The patient was brought to the treatment unit and placed in the planned treatment position for the patient's boost treatment. The clinical setup was verified. Then port films were obtained and uploaded to the radiation oncology medical record software.  The treatment beams were carefully compared against the planned radiation fields. The position, location, and shape of the radiation fields was reviewed. The targeted volume of tissue appears to be appropriately covered by the radiation beams. Based on my personal review, I approved the simulation verification. The patient's treatment will proceed as planned.  ________________________________   Jodelle Gross, MD, PhD

## 2013-09-30 NOTE — Progress Notes (Signed)
  Radiation Oncology         [phone]) [phone] ________________________________  Name: Theresa Adkins MRN: [id number]  Date: 09/20/2013  DOB: [birth year]  End of Treatment Note  Diagnosis:   Right-sided breast cancer     Indication for treatment:  curative       Radiation treatment dates:   08/24/13 - 09/20/13  Site/dose:   The patient was initially treated to the right breast with whole-breast tangent fields. A forward planning technique was used with a total of 4 fields. This delivered 42.5 Gy at 2.5 Gy per fraction. The patient then received a 7.5 Gy boost using a photon technique to the surgical cavity. The total dose was 50 Gy  Narrative: The patient tolerated radiation treatment relatively well.   The patient's skin showed some hyperpigmentation/ radiation effect towards the end of her course.  Plan: The patient has completed radiation treatment. The patient will return to radiation oncology clinic for routine followup in one month. I advised the patient to call or return sooner if they have any questions or concerns related to their recovery or treatment. ________________________________  Jodelle Gross, M.D., Ph.D.

## 2013-09-30 NOTE — Addendum Note (Signed)
Encounter addended by: Marye Round, MD on: 09/30/2013 10:10 AM<BR>     Documentation filed: Notes Section, Visit Diagnoses

## 2013-09-30 NOTE — Progress Notes (Signed)
Complex simulation note  Diagnosis: Breast cancer  Narrative The patient has initially been planned to receive a course of whole breast radiation to a dose of 42.5 Gy in 17 fractions at 2.5 gray per fraction. The patient will now receive an additional boost to the seroma cavity which has been contoured. This will correspond to a boost of 7.5 gray at 2.5 gray per fraction. To accomplish this, an additional 3 customized blocks have been designed for this purpose. A complex isodose plan is requested to ensure that the target area is adequately covered with radiation dose and that the nearby normal structures such as the lung are adequately spared. The patient's final total dose will be 50 Gy.  ------------------------------------------------  Jodelle Gross, MD, PhD

## 2013-10-10 ENCOUNTER — Other Ambulatory Visit: Payer: Self-pay | Admitting: Family Medicine

## 2013-10-26 ENCOUNTER — Ambulatory Visit (INDEPENDENT_AMBULATORY_CARE_PROVIDER_SITE_OTHER): Payer: Medicare PPO | Admitting: General Surgery

## 2013-11-02 LAB — HM DIABETES EYE EXAM

## 2013-11-22 ENCOUNTER — Other Ambulatory Visit: Payer: Self-pay | Admitting: Family Medicine

## 2013-11-22 LAB — BASIC METABOLIC PANEL WITH GFR
BUN: 7 mg/dL (ref 6–23)
CALCIUM: 9.2 mg/dL (ref 8.4–10.5)
CO2: 27 mEq/L (ref 19–32)
CREATININE: 0.76 mg/dL (ref 0.50–1.10)
Chloride: 101 mEq/L (ref 96–112)
GFR, Est African American: >89 mL/min
GFR, Est Non African American: 81 mL/min
GLUCOSE: 131 mg/dL — AB (ref 70–99)
Potassium: 3.9 mEq/L (ref 3.5–5.3)
Sodium: 141 mEq/L (ref 135–145)

## 2013-11-22 LAB — TSH: TSH: 1.874 u[IU]/mL (ref 0.350–4.500)

## 2013-11-22 LAB — HEMOGLOBIN A1C
Hgb A1c MFr Bld: 7.2 % — ABNORMAL HIGH (ref <5.7)
Mean Plasma Glucose: 160 mg/dL — ABNORMAL HIGH (ref <117)

## 2013-11-23 LAB — MICROALBUMIN / CREATININE URINE RATIO
Creatinine, Urine: 364.9 mg/dL
Microalb Creat Ratio: 20.8 mg/g (ref 0.0–30.0)
Microalb, Ur: 7.6 mg/dL — ABNORMAL HIGH (ref <2.0)

## 2013-11-25 ENCOUNTER — Encounter: Payer: Self-pay | Admitting: Family Medicine

## 2013-11-25 ENCOUNTER — Ambulatory Visit (INDEPENDENT_AMBULATORY_CARE_PROVIDER_SITE_OTHER): Payer: Medicare PPO | Admitting: Family Medicine

## 2013-11-25 VITALS — BP 132/80 | HR 81 | Resp 16 | Ht 69.0 in | Wt 279.1 lb

## 2013-11-25 DIAGNOSIS — E1121 Type 2 diabetes mellitus with diabetic nephropathy: Secondary | ICD-10-CM

## 2013-11-25 DIAGNOSIS — E785 Hyperlipidemia, unspecified: Secondary | ICD-10-CM

## 2013-11-25 DIAGNOSIS — I889 Nonspecific lymphadenitis, unspecified: Secondary | ICD-10-CM

## 2013-11-25 DIAGNOSIS — Z23 Encounter for immunization: Secondary | ICD-10-CM

## 2013-11-25 DIAGNOSIS — I10 Essential (primary) hypertension: Secondary | ICD-10-CM

## 2013-11-25 DIAGNOSIS — E669 Obesity, unspecified: Secondary | ICD-10-CM

## 2013-11-25 LAB — HEPATIC FUNCTION PANEL
ALT: 11 U/L (ref 0–35)
AST: 11 U/L (ref 0–37)
Albumin: 4.1 g/dL (ref 3.5–5.2)
Alkaline Phosphatase: 61 U/L (ref 39–117)
BILIRUBIN DIRECT: 0.1 mg/dL (ref 0.0–0.3)
BILIRUBIN INDIRECT: 0.5 mg/dL (ref 0.2–1.2)
BILIRUBIN TOTAL: 0.6 mg/dL (ref 0.2–1.2)
Total Protein: 6.8 g/dL (ref 6.0–8.3)

## 2013-11-25 LAB — URIC ACID: Uric Acid, Serum: 6.1 mg/dL (ref 2.4–7.0)

## 2013-11-25 NOTE — Patient Instructions (Addendum)
Annual wellness in 4.5 month, call if  you need me before  Flu vaccine today  Blood sugar and blood pressure are good  You need to stop adding salt to your food, this is increasing the strain on your heart, causing you to have swollen feet and spilling protein in your urine  You need to cut way back on sweets and chocolate to help with weight loss  Additional checks will be made re your cholesterol , liver and uric acid levels and we will call you with the result  Fasting lipid, cmp and eGFR, hBA1C and CBC in 4.5 months   Try to get supplemental calcium through food not medication, aim for 800 to 1000 mg daily

## 2013-11-25 NOTE — Assessment & Plan Note (Signed)
Controlled, no change in medication DASH diet and commitment to daily physical activity for a minimum of 30 minutes discussed and encouraged, as a part of hypertension management. The importance of attaining a healthy weight is also discussed.  

## 2013-11-25 NOTE — Assessment & Plan Note (Signed)
Controlled, no change in medication Patient advised to reduce carb and sweets, commit to regular physical activity, take meds as prescribed, test blood as directed, and attempt to lose weight, to improve blood sugar control.  

## 2013-11-26 DIAGNOSIS — Z23 Encounter for immunization: Secondary | ICD-10-CM | POA: Insufficient documentation

## 2013-11-26 LAB — LIPID PANEL
Cholesterol: 138 mg/dL (ref 0–200)
HDL: 41 mg/dL (ref >39)
LDL Cholesterol: 49 mg/dL (ref 0–99)
TRIGLYCERIDES: 239 mg/dL — AB (ref <150)
Total CHOL/HDL Ratio: 3.4 Ratio
VLDL: 48 mg/dL — AB (ref 0–40)

## 2013-11-26 NOTE — Assessment & Plan Note (Signed)
No current flare 

## 2013-11-26 NOTE — Progress Notes (Signed)
   Subjective:    Patient ID: Theresa Adkins, female    DOB: Jun 25, 1945, 68 y.o.   MRN: [id number]  HPI The PT is here for follow up and re-evaluation of chronic medical conditions, medication management and review of any available recent lab and radiology data.  Preventive health is updated, specifically  Cancer screening and Immunization.   Has completed radiation treatment for breast cancer has upcoming f/u with oncologist, pleased with treatmentoverall The PT denies any adverse reactions to current medications since the last visit.  There are no new concerns. Continues to over indulge in sweets and unhealthy foods with no regular exercise unfortunately There are no specific complaints       Review of Systems See HPI Denies recent fever or chills. Denies sinus pressure, nasal congestion, ear pain or sore throat. Denies chest congestion, productive cough or wheezing. Denies chest pains, palpitations and leg swelling Denies abdominal pain, nausea, vomiting,diarrhea or constipation.   Denies dysuria, frequency, hesitancy or incontinence. Denies joint pain, swelling and limitation in mobility. Denies headaches, seizures, numbness, or tingling. Denies depression, anxiety or insomnia. Denies skin break down or rash.        Objective:   Physical Exam  BP 132/80  Pulse 81  Resp 16  Ht 5\' 9"  (1.753 m)  Wt 279 lb 1.9 oz (126.608 kg)  BMI 41.20 kg/m2  SpO2 96% Patient alert and oriented and in no cardiopulmonary distress.  HEENT: No facial asymmetry, EOMI,   oropharynx pink and moist.  Neck supple no JVD, no mass.  Chest: Clear to auscultation bilaterally.  CVS: S1, S2 no murmurs, no S3.Regular rate.  ABD: Soft non tender.   Ext: No edema  MS: Adequate ROM spine, shoulders, hips and knees.  Skin: Intact, no ulcerations or rash noted.  Psych: Good eye contact, normal affect. Memory intact not anxious or depressed appearing.  CNS: CN 2-12 intact, power,  normal  throughout.no focal deficits noted.       Assessment & Plan:  Hypertension goal BP (blood pressure) < 130/80 Controlled, no change in medication DASH diet and commitment to daily physical activity for a minimum of 30 minutes discussed and encouraged, as a part of hypertension management. The importance of attaining a healthy weight is also discussed.   Diabetes mellitus with nephropathy Controlled, no change in medication.  Patient advised to reduce carb and sweets, commit to regular physical activity, take meds as prescribed, test blood as directed, and attempt to lose weight, to improve blood sugar control.   Hyperlipidemia Hyperlipidemia:Low fat diet discussed and encouraged.  Updated lab needed will add on today  Obesity, Class III, BMI 40-49.9 (morbid obesity) Deteriorated. Patient re-educated about  the importance of commitment to a  minimum of 150 minutes of exercise per week. The importance of healthy food choices with portion control discussed. Encouraged to start a food diary, count calories and to consider  joining a support group. Sample diet sheets offered. Goals set by the patient for the next several months.     Axillary adenitis No current flare  Need for prophylactic vaccination and inoculation against influenza Vaccine administered at visit.

## 2013-11-26 NOTE — Assessment & Plan Note (Signed)
Vaccine administered at visit.  

## 2013-11-26 NOTE — Assessment & Plan Note (Signed)
Deteriorated. Patient re-educated about  the importance of commitment to a  minimum of 150 minutes of exercise per week. The importance of healthy food choices with portion control discussed. Encouraged to start a food diary, count calories and to consider  joining a support group. Sample diet sheets offered. Goals set by the patient for the next several months.    

## 2013-11-26 NOTE — Assessment & Plan Note (Signed)
Hyperlipidemia:Low fat diet discussed and encouraged.  Updated lab needed will add on today

## 2013-12-20 ENCOUNTER — Other Ambulatory Visit: Payer: Self-pay | Admitting: Family Medicine

## 2014-01-07 ENCOUNTER — Other Ambulatory Visit: Payer: Self-pay | Admitting: Family Medicine

## 2014-01-31 ENCOUNTER — Other Ambulatory Visit: Payer: Self-pay | Admitting: Family Medicine

## 2014-02-01 ENCOUNTER — Other Ambulatory Visit: Payer: Self-pay

## 2014-02-01 MED ORDER — BISOPROLOL-HYDROCHLOROTHIAZIDE 10-6.25 MG PO TABS
1.0000 | ORAL_TABLET | Freq: Every day | ORAL | Status: DC
Start: 2014-02-01 — End: 2014-07-30

## 2014-04-01 ENCOUNTER — Other Ambulatory Visit: Payer: Self-pay | Admitting: Family Medicine

## 2014-04-01 DIAGNOSIS — C50911 Malignant neoplasm of unspecified site of right female breast: Secondary | ICD-10-CM

## 2014-04-12 ENCOUNTER — Other Ambulatory Visit: Payer: Self-pay

## 2014-04-12 ENCOUNTER — Ambulatory Visit (HOSPITAL_COMMUNITY)
Admission: RE | Admit: 2014-04-12 | Discharge: 2014-04-12 | Disposition: A | Discharge status: Home / Self Care | Payer: Medicare PPO | Source: Ambulatory Visit | Attending: Family Medicine | Admitting: Family Medicine

## 2014-04-12 DIAGNOSIS — Z923 Personal history of irradiation: Secondary | ICD-10-CM | POA: Diagnosis not present

## 2014-04-12 DIAGNOSIS — Z853 Personal history of malignant neoplasm of breast: Secondary | ICD-10-CM | POA: Diagnosis not present

## 2014-04-12 DIAGNOSIS — C50911 Malignant neoplasm of unspecified site of right female breast: Secondary | ICD-10-CM

## 2014-04-12 MED ORDER — ATORVASTATIN CALCIUM 40 MG PO TABS: ORAL_TABLET | ORAL | Status: DC

## 2014-04-28 ENCOUNTER — Ambulatory Visit (INDEPENDENT_AMBULATORY_CARE_PROVIDER_SITE_OTHER): Payer: Medicare PPO | Admitting: Family Medicine

## 2014-04-28 ENCOUNTER — Encounter: Payer: Self-pay | Admitting: Family Medicine

## 2014-04-28 VITALS — BP 134/80 | HR 67 | Resp 16 | Ht 69.0 in | Wt 256.0 lb

## 2014-04-28 DIAGNOSIS — E1121 Type 2 diabetes mellitus with diabetic nephropathy: Secondary | ICD-10-CM

## 2014-04-28 DIAGNOSIS — Z13820 Encounter for screening for osteoporosis: Secondary | ICD-10-CM

## 2014-04-28 DIAGNOSIS — I10 Essential (primary) hypertension: Secondary | ICD-10-CM

## 2014-04-28 DIAGNOSIS — Z0000 Encounter for general adult medical examination without abnormal findings: Secondary | ICD-10-CM | POA: Insufficient documentation

## 2014-04-28 DIAGNOSIS — E785 Hyperlipidemia, unspecified: Secondary | ICD-10-CM

## 2014-04-28 MED ORDER — GLUCOSE BLOOD VI STRP: ORAL_STRIP | Status: DC

## 2014-04-28 NOTE — Progress Notes (Signed)
Subjective:    Patient ID: Theresa Adkins, female    DOB: 1945/06/07, 69 y.o.   MRN: [id number]  HPI Preventive Screening-Counseling & Management   Patient present here today for a Medicare annual wellness visit.   Current Problems (verified)   Medications Prior to Visit Allergies (verified)   PAST HISTORY  Family History (verified)   Social History Divorced, 1 son, retired from [employer]    Risk Factors  Current exercise habits: some exercising at home not but wants to get DM shoes to start walking   Dietary issues discussed: Heart healthy diet discussed, fruits and vegetables and limiting fried fatty foods and red meat    Cardiac risk factors: mother- heart disease   Depression Screen  (Note: if answer to either of the following is "Yes", a more complete depression screening is indicated)   Over the past two weeks, have you felt down, depressed or hopeless? No  Over the past two weeks, have you felt little interest or pleasure in doing things? No  Have you lost interest or pleasure in daily life? No  Do you often feel hopeless? No  Do you cry easily over simple problems? No   Activities of Daily Living  In your present state of health, do you have any difficulty performing the following activities?  Driving?: No Managing money?: No Feeding yourself?:No Getting from bed to chair?:No Climbing a flight of stairs?:No Preparing food and eating?:No Bathing or showering?:No Getting dressed?:No Getting to the toilet?:No Using the toilet?:No Moving around from place to place?: No  Fall Risk Assessment In the past year have you fallen or had a near fall?:No Are you currently taking any medications that make you dizziy?:No   Hearing Difficulties: No Do you often ask people to speak up or repeat themselves?:No Do you experience ringing or noises in your ears?:No Do you have difficulty understanding soft or whispered voices?:No  Cognitive  Testing  Alert? Yes Normal Appearance?Yes  Oriented to person? Yes Place? Yes  Time? Yes  Displays appropriate judgment?Yes  Can read the correct time from a watch face? yes Are you having problems remembering things?No  Advanced Directives have been discussed with the patient?Yes, brochure given , full code   List the Names of Other Physician/Practitioners you currently use:  Dr Marlou Starks (surgeon)   Indicate any recent Medical Services you may have received from other than [facility] providers in the past year (date may be approximate).   Assessment:    Annual Wellness Exam   Plan:    Patient Instructions (the written plan) was given to the patient.  Medicare Attestation  I have personally reviewed:  The patient's medical and social history  Their use of alcohol, tobacco or illicit drugs  Their current medications and supplements  The patient's functional ability including ADLs,fall risks, home safety risks, cognitive, and hearing and visual impairment  Diet and physical activities  Evidence for depression or mood disorders  The patient's weight, height, BMI, and visual acuity have been recorded in the chart. I have made referrals, counseling, and provided education to the patient based on review of the above and I have provided the patient with a written personalized care plan for preventive services.      Review of Systems     Objective:   Physical Exam  BP 134/80 mmHg  Pulse 67  Resp 16  Ht 5\' 9"  (1.753 m)  Wt 256 lb (116.121 kg)  BMI 37.79 kg/m2  SpO2 97%  Assessment & Plan:  Medicare annual wellness visit, subsequent Annual exam as documented. Counseling done  re healthy lifestyle involving commitment to 150 minutes exercise per week, heart healthy diet, and attaining healthy weight.The importance of adequate sleep also discussed. Regular seat belt use and home safety, is also discussed. Changes in health habits are decided on by the patient with goals and  time frames  set for achieving them. Immunization and cancer screening needs are specifically addressed at this visit.

## 2014-04-28 NOTE — Patient Instructions (Signed)
F/u in 4 month, cALL IF YOU NEED ME BEFORE  Congrats on improved health  Scripts for shoes and strips are provided  You are referred for bone density tesy.  Fasting labs before next visit  REDUCE lipitor to one tab 3 times weekly  Thanks for choosing [facility], we consider it a privelige to serve you.   Fall Prevention and Home Safety Falls cause injuries and can affect all age groups. It is possible to prevent falls.  HOW TO PREVENT FALLS  Wear shoes with rubber soles that do not have an opening for your toes.  Keep the inside and outside of your house well lit.  Use night lights throughout your home.  Remove clutter from floors.  Clean up floor spills.  Remove throw rugs or fasten them to the floor with carpet tape.  Do not place electrical cords across pathways.  Put grab bars by your tub, shower, and toilet. Do not use towel bars as grab bars.  Put handrails on both sides of the stairway. Fix loose handrails.  Do not climb on stools or stepladders, if possible.  Do not wax your floors.  Repair uneven or unsafe sidewalks, walkways, or stairs.  Keep items you use a lot within reach.  Be aware of pets.  Keep emergency numbers next to the telephone.  Put smoke detectors in your home and near bedrooms. Ask your doctor what other things you can do to prevent falls. Document Released: 12/01/2008 Document Revised: 08/06/2011 Document Reviewed: 05/07/2011 [facility] Patient Information 2015 [place], [place]. This information is not intended to replace advice given to you by your health care provider. Make sure you discuss any questions you have with your health care provider.

## 2014-04-28 NOTE — Assessment & Plan Note (Signed)

## 2014-05-04 ENCOUNTER — Telehealth: Payer: Self-pay | Admitting: Family Medicine

## 2014-05-04 NOTE — Telephone Encounter (Signed)
pls let her know I spoke directly with the radiologist, radiation she is receiving for bone density testing is minimal, and not to her breasts but her spine, will have no effect on her breast cancer , safe to go ahead abnd get the  Bone density test done

## 2014-05-04 NOTE — Telephone Encounter (Signed)
Patient received recent radiation treatments and was concerned about getting the bone density test tomorrow. Wants to know she should wait or if it would be ok to go ahead with procedure. (scheduled for tomorrow)

## 2014-05-04 NOTE — Telephone Encounter (Signed)
Patient aware.

## 2014-05-05 ENCOUNTER — Ambulatory Visit (HOSPITAL_COMMUNITY)
Admission: RE | Admit: 2014-05-05 | Discharge: 2014-05-05 | Disposition: A | Discharge status: Home / Self Care | Payer: Medicare PPO | Source: Ambulatory Visit | Attending: Family Medicine | Admitting: Family Medicine

## 2014-05-05 DIAGNOSIS — Z13820 Encounter for screening for osteoporosis: Secondary | ICD-10-CM | POA: Insufficient documentation

## 2014-05-26 ENCOUNTER — Telehealth: Payer: Self-pay | Admitting: Family Medicine

## 2014-05-26 NOTE — Telephone Encounter (Signed)
I have not directly called the pt, will whichever of you who called her pls respond  I believe this is the dexa result

## 2014-05-26 NOTE — Telephone Encounter (Signed)
Called and cleared call with patient.  She is aware of bone density results.

## 2014-07-14 ENCOUNTER — Telehealth: Payer: Self-pay | Admitting: Family Medicine

## 2014-07-14 ENCOUNTER — Other Ambulatory Visit: Payer: Self-pay

## 2014-07-14 MED ORDER — AMLODIPINE BESYLATE 5 MG PO TABS: 5.0000 mg | ORAL_TABLET | Freq: Every day | ORAL | Status: DC

## 2014-07-15 NOTE — Telephone Encounter (Signed)
[place] faxed in rxs

## 2014-07-30 ENCOUNTER — Other Ambulatory Visit: Payer: Self-pay | Admitting: Family Medicine

## 2014-08-15 ENCOUNTER — Other Ambulatory Visit: Payer: Self-pay

## 2014-09-01 ENCOUNTER — Ambulatory Visit (INDEPENDENT_AMBULATORY_CARE_PROVIDER_SITE_OTHER): Payer: Medicare PPO | Admitting: Family Medicine

## 2014-09-01 ENCOUNTER — Encounter: Payer: Self-pay | Admitting: Family Medicine

## 2014-09-01 VITALS — BP 120/80 | HR 68 | Resp 16 | Ht 69.0 in | Wt 245.0 lb

## 2014-09-01 DIAGNOSIS — B369 Superficial mycosis, unspecified: Secondary | ICD-10-CM | POA: Diagnosis not present

## 2014-09-01 DIAGNOSIS — E1121 Type 2 diabetes mellitus with diabetic nephropathy: Secondary | ICD-10-CM

## 2014-09-01 DIAGNOSIS — I10 Essential (primary) hypertension: Secondary | ICD-10-CM | POA: Diagnosis not present

## 2014-09-01 DIAGNOSIS — E785 Hyperlipidemia, unspecified: Secondary | ICD-10-CM

## 2014-09-01 DIAGNOSIS — L732 Hidradenitis suppurativa: Secondary | ICD-10-CM

## 2014-09-01 LAB — HEMOGLOBIN A1C
A1c: 6.5
LDL: 60

## 2014-09-01 MED ORDER — ATORVASTATIN CALCIUM 10 MG PO TABS: 10.0000 mg | ORAL_TABLET | Freq: Every day | ORAL | Status: DC

## 2014-09-01 MED ORDER — CLOTRIMAZOLE-BETAMETHASONE 1-0.05 % EX CREA: 1.0000 "application " | TOPICAL_CREAM | Freq: Two times a day (BID) | CUTANEOUS | Status: DC

## 2014-09-01 MED ORDER — TERBINAFINE HCL 250 MG PO TABS
250.0000 mg | ORAL_TABLET | Freq: Every day | ORAL | Status: DC
Start: 2014-09-01 — End: 2014-12-29

## 2014-09-01 MED ORDER — TERBINAFINE HCL 250 MG PO TABS: 250.0000 mg | ORAL_TABLET | Freq: Every day | ORAL | Status: DC

## 2014-09-01 NOTE — Assessment & Plan Note (Addendum)
Rash on RLQ approx 4 in diameter, antifungal cream and tablets prescribed also fluconazole

## 2014-09-01 NOTE — Patient Instructions (Addendum)
F/u in 4 month, call if you need me before  CONGRATS on excellent labds and improved health.  It is important that you exercise regularly at least 30 minutes75 times a week. If you develop chest pain, have severe difficulty breathing, or feel very tired, stop exercising immediately and seek medical attention   Continue metformin 1000 mg one daily  When next due your lipitor is reduced to 10 mg one daily every pm  PLEASE reconsider pneumonia vaccine   Fasting lipid, cmp and eGFR, hBA1C and microalb in  4 month  Tablets and cream are prescribed for your rash  Please work on good  health habits so that your health will improve. 1. Commitment to daily physical activity for 30 to 60  minutes, if you are able to do this.  2. Commitment to wise food choices. Aim for half of your  food intake to be vegetable and fruit, one quarter starchy foods, and one quarter protein. Try to eat on a regular schedule  3 meals per day, snacking between meals should be limited to vegetables or fruits or small portions of nuts. 64 ounces of water per day is generally recommended, unless you have specific health conditions, like heart failure or kidney failure where you will need to limit fluid intake.  3. Commitment to sufficient and a  good quality of physical and mental rest daily, generally between 6 to 8 hours per day.  WITH PERSISTANCE AND PERSEVERANCE, THE IMPOSSIBLE , BECOMES THE NORM!   Thanks for choosing [facility], we consider it a privelige to serve you.

## 2014-09-02 ENCOUNTER — Other Ambulatory Visit: Payer: Self-pay

## 2014-09-02 MED ORDER — BISOPROLOL-HYDROCHLOROTHIAZIDE 10-6.25 MG PO TABS: 1.0000 | ORAL_TABLET | Freq: Every day | ORAL | Status: DC

## 2014-09-02 MED ORDER — METFORMIN HCL 1000 MG PO TABS: ORAL_TABLET | ORAL | Status: DC

## 2014-09-03 NOTE — Assessment & Plan Note (Signed)
Improved, but still needs to commit to exercise Patient re-educated about  the importance of commitment to a  minimum of 150 minutes of exercise per week.  The importance of healthy food choices with portion control discussed. Encouraged to start a food diary, count calories and to consider  joining a support group. Sample diet sheets offered. Goals set by the patient for the next several months.   Weight /BMI 09/01/2014 04/28/2014 11/25/2013  WEIGHT 245 lb 256 lb 279 lb 1.9 oz  HEIGHT 5\' 9"  5\' 9"  5\' 9"   BMI 36.16 kg/m2 37.79 kg/m2 41.2 kg/m2    Current exercise per week 30 minutes.

## 2014-09-03 NOTE — Progress Notes (Signed)
Theresa Adkins     MRN: [id number]      DOB: 11/20/1945   HPI Theresa Adkins is here for follow up and re-evaluation of chronic medical conditions, medication management and review of any available recent lab and radiology data.  Preventive health is updated, specifically  Cancer screening and Immunization.   Questions or concerns regarding consultations or procedures which the PT has had in the interim are  addressed. The PT denies any adverse reactions to current medications since the last visit.  1 year h/o spreading pruritic rash on abdominal wall right lower quadrant Denies polyuria, polydipsia, blurred vision , or hypoglycemic episodes. Still not exercising but following low carb diet   ROS Denies recent fever or chills. Denies sinus pressure, nasal congestion, ear pain or sore throat. Denies chest congestion, productive cough or wheezing. Denies chest pains, palpitations and leg swelling Denies abdominal pain, nausea, vomiting,diarrhea or constipation.   Denies dysuria, frequency, hesitancy or incontinence. Denies joint pain, swelling and limitation in mobility. Denies headaches, seizures, numbness, or tingling. Denies depression, anxiety or insomnia. PE  BP 120/80 mmHg  Pulse 68  Resp 16  Ht 5\' 9"  (1.753 m)  Wt 245 lb (111.131 kg)  BMI 36.16 kg/m2  SpO2 98%  Patient alert and oriented and in no cardiopulmonary distress.  HEENT: No facial asymmetry, EOMI,   oropharynx pink and moist.  Neck supple no JVD, no mass.  Chest: Clear to auscultation bilaterally.  CVS: S1, S2 no murmurs, no S3.Regular rate.  ABD: Soft non tender.   Ext: No edema  MS: Adequate ROM spine, shoulders, hips and knees.  Skin: fungal infection affecting RLQ abdominal wall , diameter approx 4 inches  Psych: Good eye contact, normal affect. Memory intact not anxious or depressed appearing.  CNS: CN 2-12 intact, power,  normal throughout.no focal deficits noted.   Assessment &  Plan   Dermatomycosis Rash on RLQ approx 4 in diameter, antifungal cream and tablets prescribed also fluconazole  Hypertension goal BP (blood pressure) < 130/80 Controlled, no change in medication DASH diet and commitment to daily physical activity for a minimum of 30 minutes discussed and encouraged, as a part of hypertension management. The importance of attaining a healthy weight is also discussed.  BP/Weight 09/01/2014 04/28/2014 11/25/2013 09/17/2013 09/10/2013 09/03/2013 5/95/6387  Systolic BP 564 332 951 884 166 063 016  Diastolic BP 80 80 80 60 59 61 70  Wt. (Lbs) 245 256 279.12 279 278.3 277.8 277.3  BMI 36.16 37.79 41.2 41.18 41.08 41.01 40.93        Diabetes mellitus with nephropathy Controlled, no change in medication Theresa Adkins is reminded of the importance of commitment to daily physical activity for 30 minutes or more, as able and the need to limit carbohydrate intake to 30 to 60 grams per meal to help with blood sugar control.   The need to take medication as prescribed, test blood sugar as directed, and to call between visits if there is a concern that blood sugar is uncontrolled is also discussed.   Theresa Adkins is reminded of the importance of daily foot exam, annual eye examination, and good blood sugar, blood pressure and cholesterol control.  Diabetic Labs Latest Ref Rng 11/22/2013 07/13/2013 06/25/2013 03/09/2013 11/06/2012  HbA1c <5.7 % 7.2(H) 7.1(H) - 7.5(H) 6.8(H)  Microalbumin <2.0 mg/dL 7.6(H) 2.63(H) - - -  Micro/Creat Ratio 0.0 - 30.0 mg/g 20.8 22.6 - - -  Chol 0 - 200 mg/dL 138 120 - - 130  HDL >39 mg/dL 41 37(L) - - 41  Calc LDL 0 - 99 mg/dL 49 44 - - 55  Triglycerides <150 mg/dL 239(H) 195(H) - - 169(H)  Creatinine 0.50 - 1.10 mg/dL 0.76 0.77 0.75 0.78 0.79   BP/Weight 09/01/2014 04/28/2014 11/25/2013 09/17/2013 09/10/2013 09/03/2013 1/00/7121  Systolic BP 975 883 254 982 641 583 094  Diastolic BP 80 80 80 60 59 61 70  Wt. (Lbs) 245 256 279.12 279 278.3 277.8 277.3   BMI 36.16 37.79 41.2 41.18 41.08 41.01 40.93   Foot/eye exam completion dates Latest Ref Rng 04/28/2014 11/02/2013  Eye Exam No Retinopathy - No Retinopathy  Foot exam Order - - -  Foot Form Completion - Done -         Hyperlipidemia Controlled, no change in medication Hyperlipidemia:Low fat diet discussed and encouraged.  More recent lab available , shows good control, med dose decreased Lipid Panel  Lab Results  Component Value Date   CHOL 138 11/22/2013   HDL 41 11/22/2013   LDLCALC 49 11/22/2013   TRIG 239* 11/22/2013   CHOLHDL 3.4 11/22/2013        Obesity, Class III, BMI 40-49.9 (morbid obesity) Improved, but still needs to commit to exercise Patient re-educated about  the importance of commitment to a  minimum of 150 minutes of exercise per week.  The importance of healthy food choices with portion control discussed. Encouraged to start a food diary, count calories and to consider  joining a support group. Sample diet sheets offered. Goals set by the patient for the next several months.   Weight /BMI 09/01/2014 04/28/2014 11/25/2013  WEIGHT 245 lb 256 lb 279 lb 1.9 oz  HEIGHT 5\' 9"  5\' 9"  5\' 9"   BMI 36.16 kg/m2 37.79 kg/m2 41.2 kg/m2    Current exercise per week 30 minutes.   HIDRADENITIS No current or recent flare

## 2014-09-03 NOTE — Assessment & Plan Note (Signed)
Controlled, no change in medication [name] is reminded of the importance of commitment to daily physical activity for 30 minutes or more, as able and the need to limit carbohydrate intake to 30 to 60 grams per meal to help with blood sugar control.   The need to take medication as prescribed, test blood sugar as directed, and to call between visits if there is a concern that blood sugar is uncontrolled is also discussed.   [name] is reminded of the importance of daily foot exam, annual eye examination, and good blood sugar, blood pressure and cholesterol control.  Diabetic Labs Latest Ref Rng 11/22/2013 07/13/2013 06/25/2013 03/09/2013 11/06/2012  HbA1c <5.7 % 7.2(H) 7.1(H) - 7.5(H) 6.8(H)  Microalbumin <2.0 mg/dL 7.6(H) 2.63(H) - - -  Micro/Creat Ratio 0.0 - 30.0 mg/g 20.8 22.6 - - -  Chol 0 - 200 mg/dL 138 120 - - 130  HDL >39 mg/dL 41 37(L) - - 41  Calc LDL 0 - 99 mg/dL 49 44 - - 55  Triglycerides <150 mg/dL 239(H) 195(H) - - 169(H)  Creatinine 0.50 - 1.10 mg/dL 0.76 0.77 0.75 0.78 0.79   BP/Weight 09/01/2014 04/28/2014 11/25/2013 09/17/2013 09/10/2013 09/03/2013 8/67/5449  Systolic BP 201 007 121 975 883 254 982  Diastolic BP 80 80 80 60 59 61 70  Wt. (Lbs) 245 256 279.12 279 278.3 277.8 277.3  BMI 36.16 37.79 41.2 41.18 41.08 41.01 40.93   Foot/eye exam completion dates Latest Ref Rng 04/28/2014 11/02/2013  Eye Exam No Retinopathy - No Retinopathy  Foot exam Order - - -  Foot Form Completion - Done -

## 2014-09-03 NOTE — Assessment & Plan Note (Signed)
Controlled, no change in medication DASH diet and commitment to daily physical activity for a minimum of 30 minutes discussed and encouraged, as a part of hypertension management. The importance of attaining a healthy weight is also discussed.  BP/Weight 09/01/2014 04/28/2014 11/25/2013 09/17/2013 09/10/2013 09/03/2013 9/60/4540  Systolic BP 981 191 478 295 621 308 657  Diastolic BP 80 80 80 60 59 61 70  Wt. (Lbs) 245 256 279.12 279 278.3 277.8 277.3  BMI 36.16 37.79 41.2 41.18 41.08 41.01 40.93

## 2014-09-03 NOTE — Assessment & Plan Note (Signed)
Controlled, no change in medication Hyperlipidemia:Low fat diet discussed and encouraged.  More recent lab available , shows good control, med dose decreased Lipid Panel  Lab Results  Component Value Date   CHOL 138 11/22/2013   HDL 41 11/22/2013   LDLCALC 49 11/22/2013   TRIG 239* 11/22/2013   CHOLHDL 3.4 11/22/2013

## 2014-09-03 NOTE — Assessment & Plan Note (Signed)
No current or recent flare

## 2014-09-13 ENCOUNTER — Encounter: Payer: Self-pay | Admitting: Family Medicine

## 2014-11-01 ENCOUNTER — Ambulatory Visit (INDEPENDENT_AMBULATORY_CARE_PROVIDER_SITE_OTHER): Payer: Medicare PPO

## 2014-11-01 DIAGNOSIS — Z23 Encounter for immunization: Secondary | ICD-10-CM | POA: Diagnosis not present

## 2014-11-29 ENCOUNTER — Telehealth: Payer: Self-pay

## 2014-11-29 DIAGNOSIS — E119 Type 2 diabetes mellitus without complications: Secondary | ICD-10-CM

## 2014-11-29 NOTE — Telephone Encounter (Signed)
When speaking with patient she states that she would like to be referred to see another eye doctor after the scheduling mix with Dr. Kellie Moor office.  Will enter referral for Dr. Jorja Loa

## 2014-12-29 ENCOUNTER — Encounter: Payer: Self-pay | Admitting: Family Medicine

## 2014-12-29 ENCOUNTER — Ambulatory Visit (INDEPENDENT_AMBULATORY_CARE_PROVIDER_SITE_OTHER): Payer: Medicare PPO | Admitting: Family Medicine

## 2014-12-29 ENCOUNTER — Telehealth: Payer: Self-pay | Admitting: Family Medicine

## 2014-12-29 VITALS — BP 128/74 | HR 56 | Resp 16 | Ht 69.0 in | Wt 255.8 lb

## 2014-12-29 DIAGNOSIS — Z1159 Encounter for screening for other viral diseases: Secondary | ICD-10-CM

## 2014-12-29 DIAGNOSIS — E1121 Type 2 diabetes mellitus with diabetic nephropathy: Secondary | ICD-10-CM

## 2014-12-29 DIAGNOSIS — E785 Hyperlipidemia, unspecified: Secondary | ICD-10-CM | POA: Diagnosis not present

## 2014-12-29 DIAGNOSIS — Z23 Encounter for immunization: Secondary | ICD-10-CM

## 2014-12-29 DIAGNOSIS — L723 Sebaceous cyst: Secondary | ICD-10-CM

## 2014-12-29 DIAGNOSIS — I10 Essential (primary) hypertension: Secondary | ICD-10-CM

## 2014-12-29 LAB — CHOLESTEROL, TOTAL
A1c: 6.7
Cholesterol: 172
LDL: 81
Microalb/Creat Ratio: 29.2

## 2014-12-29 NOTE — Telephone Encounter (Signed)
Patient states that she is having popping of her joints with movement.  And would like to know if there is anything that she can do.  She forgot to discuss at appointment.

## 2014-12-29 NOTE — Patient Instructions (Addendum)
Annual physical exam in 4 month, call if you eed me sooner  Prevnar today  Labs are good, but room for improvement as you know  Pls commit to exercise in the home , happy that you have started walking  Please work on good  health habits so that your health will improve. 1. Commitment to daily physical activity for 30 to 60  minutes, if you are able to do this.  2. Commitment to wise food choices. Aim for half of your  food intake to be vegetable and fruit, one quarter starchy foods, and one quarter protein. Try to eat on a regular schedule  3 meals per day, snacking between meals should be limited to vegetables or fruits or small portions of nuts. 64 ounces of water per day is generally recommended, unless you have specific health conditions, like heart failure or kidney failure where you will need to limit fluid intake.  3. Commitment to sufficient and a  good quality of physical and mental rest daily, generally between 6 to 8 hours per day.  WITH PERSISTANCE AND PERSEVERANCE, THE IMPOSSIBLE , BECOMES THE NORM! Thanks for choosing [facility], we consider it a privelige to serve you. Fasting labs for next visit  Cyst on back , I recommend removal since deep, hard and relatively large, not infected

## 2014-12-29 NOTE — Progress Notes (Signed)
Subjective:    Patient ID: Theresa Adkins, female    DOB: [birth year], 69 y.o.   MRN: [id number]  HPI   Theresa Adkins     MRN: [id number]      DOB: Feb 22, 1945   HPI Theresa Adkins is here for follow up and re-evaluation of chronic medical conditions, medication management and review of any available recent lab and radiology data.  Preventive health is updated, specifically  Cancer screening and Immunization.   Questions or concerns regarding consultations or procedures which the PT has had in the interim are  addressed. The PT denies any adverse reactions to current medications since the last visit.  There are no new concerns.  There are no specific complaints   ROS Denies recent fever or chills. Denies sinus pressure, nasal congestion, ear pain or sore throat. Denies chest congestion, productive cough or wheezing. Denies chest pains, palpitations and leg swelling Denies abdominal pain, nausea, vomiting,diarrhea or constipation.   Denies dysuria, frequency, hesitancy or incontinence. Denies joint pain, swelling and limitation in mobility. Denies headaches, seizures, numbness, or tingling. Denies depression, anxiety or insomnia. Denies skin break down or rash.c/o large cyst on back wants it checked   PE  BP 128/74 mmHg  Pulse 56  Resp 16  Ht 5\' 9"  (1.753 m)  Wt 255 lb 12.8 oz (116.03 kg)  BMI 37.76 kg/m2  SpO2 99%  Patient alert and oriented and in no cardiopulmonary distress.  HEENT: No facial asymmetry, EOMI,   oropharynx pink and moist.  Neck supple no JVD, no mass.  Chest: Clear to auscultation bilaterally.  CVS: S1, S2 no murmurs, no S3.Regular rate.  ABD: Soft non tender.   Ext: No edema  MS: Adequate ROM spine, shoulders, hips and knees.  Skin: Intact, no ulcerations or rash noted.Large sebaceous cyst on mid back, not red or warm or tender, no drainage, dia apprx 5 cm, firm and deep  Psych: Good eye contact, normal affect. Memory intact not anxious or  depressed appearing.  CNS: CN 2-12 intact, power,  normal throughout.no focal deficits noted.   Assessment & Plan  Hypertension goal BP (blood pressure) < 130/80 Controlled, no change in medication DASH diet and commitment to daily physical activity for a minimum of 30 minutes discussed and encouraged, as a part of hypertension management. The importance of attaining a healthy weight is also discussed.  BP/Weight 12/29/2014 09/01/2014 04/28/2014 11/25/2013 09/17/2013 09/10/2013 [id number]  Systolic BP 0000000 123456 Q000111Q Q000111Q 0000000 [id number]  Diastolic BP 74 80 80 80 60 59 61  Wt. (Lbs) 255.8 245 256 279.12 279 278.3 277.8  BMI 37.76 36.16 37.79 41.2 41.18 41.08 41.01        Diabetes mellitus with nephropathy Less well controlled but still adequately controlled. No med change [name] is reminded of the importance of commitment to daily physical activity for 30 minutes or more, as able and the need to limit carbohydrate intake to 30 to 60 grams per meal to help with blood sugar control.   The need to take medication as prescribed, test blood sugar as directed, and to call between visits if there is a concern that blood sugar is uncontrolled is also discussed.   [name] is reminded of the importance of daily foot exam, annual eye examination, and good blood sugar, blood pressure and cholesterol control.  Diabetic Labs Latest Ref Rng 12/23/2014 11/22/2013 07/13/2013 06/25/2013 03/09/2013  HbA1c <5.7 % - 7.2(H) 7.1(H) - 7.5(H)  Microalbumin <2.0 mg/dL - 7.6(H)  2.63(H) - -  Micro/Creat Ratio 0.0 - 30.0 mg/g - 20.8 22.6 - -  Chol - 172 138 120 - -  HDL >39 mg/dL - 41 37(L) - -  Calc LDL 0 - 99 mg/dL - 49 44 - -  Triglycerides <150 mg/dL - 239(H) 195(H) - -  Creatinine 0.50 - 1.10 mg/dL - 0.76 0.77 0.75 0.78   BP/Weight 12/29/2014 09/01/2014 04/28/2014 11/25/2013 09/17/2013 09/10/2013 [id number]  Systolic BP 0000000 123456 Q000111Q Q000111Q 0000000 [id number]  Diastolic BP 74 80 80 80 60 59 61  Wt. (Lbs) 255.8 245 256 279.12 279  278.3 277.8  BMI 37.76 36.16 37.79 41.2 41.18 41.08 41.01   Foot/eye exam completion dates Latest Ref Rng 04/28/2014 11/02/2013  Eye Exam No Retinopathy - No Retinopathy  Foot exam Order - - -  Foot Form Completion - Done -         Hyperlipidemia Hyperlipidemia:Low fat diet discussed and encouraged.   Lipid Panel  Lab Results  Component Value Date   CHOL 172 12/23/2014   HDL 41 11/22/2013   LDLCALC 49 11/22/2013   TRIG 239* 11/22/2013   CHOLHDL 3.4 11/22/2013   Updated lab needed at/ before next visit.   `   Obesity, Class III, BMI 40-49.9 (morbid obesity) Deteriorated. Patient re-educated about  the importance of commitment to a  minimum of 150 minutes of exercise per week.  The importance of healthy food choices with portion control discussed. Encouraged to start a food diary, count calories and to consider  joining a support group. Sample diet sheets offered. Goals set by the patient for the next several months.   Weight /BMI 12/29/2014 09/01/2014 04/28/2014  WEIGHT 255 lb 12.8 oz 245 lb 256 lb  HEIGHT 5\' 9"  5\' 9"  5\' 9"   BMI 37.76 kg/m2 36.16 kg/m2 37.79 kg/m2    Current exercise per week 60 minutes.   Need for vaccination with 13-polyvalent pneumococcal conjugate vaccine After obtaining informed consent, the vaccine is  administered by LPN.   Sebaceous cyst Large deep cyst on upper back, recommend removal, she will discuss with her surgeon when she next sees him       Review of Systems     Objective:   Physical Exam        Assessment & Plan:

## 2014-12-29 NOTE — Telephone Encounter (Signed)
Patient wants a returned call from Dr. Moshe Cipro, she states that she forgot to ask her a question, please advise?

## 2014-12-29 NOTE — Telephone Encounter (Signed)
Patient aware.  Will mail handout on exercises that she can do to improve strength.

## 2014-12-29 NOTE — Telephone Encounter (Signed)
Let her know this sounds lioke arhtritis. If  She feels as if pop will make her fall, needs too see ortho If not, work on weight loss, and daily safe exercise to strengthen muscles and improve stability, and preserve joints If having pain ,tylenol 500mg  one daily OK

## 2015-01-07 ENCOUNTER — Other Ambulatory Visit: Payer: Self-pay | Admitting: Family Medicine

## 2015-01-07 DIAGNOSIS — Z23 Encounter for immunization: Secondary | ICD-10-CM | POA: Insufficient documentation

## 2015-01-07 DIAGNOSIS — L723 Sebaceous cyst: Secondary | ICD-10-CM | POA: Insufficient documentation

## 2015-01-07 NOTE — Assessment & Plan Note (Signed)
Controlled, no change in medication DASH diet and commitment to daily physical activity for a minimum of 30 minutes discussed and encouraged, as a part of hypertension management. The importance of attaining a healthy weight is also discussed.  BP/Weight 12/29/2014 09/01/2014 04/28/2014 11/25/2013 09/17/2013 09/10/2013 [id number]  Systolic BP 0000000 123456 Q000111Q Q000111Q 0000000 [id number]  Diastolic BP 74 80 80 80 60 59 61  Wt. (Lbs) 255.8 245 256 279.12 279 278.3 277.8  BMI 37.76 36.16 37.79 41.2 41.18 41.08 41.01

## 2015-01-07 NOTE — Assessment & Plan Note (Signed)
Hyperlipidemia:Low fat diet discussed and encouraged.   Lipid Panel  Lab Results  Component Value Date   CHOL 172 12/23/2014   HDL 41 11/22/2013   LDLCALC 49 11/22/2013   TRIG 239* 11/22/2013   CHOLHDL 3.4 11/22/2013   Updated lab needed at/ before next visit.   `

## 2015-01-07 NOTE — Assessment & Plan Note (Signed)
Less well controlled but still adequately controlled. No med change Theresa Adkins is reminded of the importance of commitment to daily physical activity for 30 minutes or more, as able and the need to limit carbohydrate intake to 30 to 60 grams per meal to help with blood sugar control.   The need to take medication as prescribed, test blood sugar as directed, and to call between visits if there is a concern that blood sugar is uncontrolled is also discussed.   Theresa Adkins is reminded of the importance of daily foot exam, annual eye examination, and good blood sugar, blood pressure and cholesterol control.  Diabetic Labs Latest Ref Rng 12/23/2014 11/22/2013 07/13/2013 06/25/2013 03/09/2013  HbA1c <5.7 % - 7.2(H) 7.1(H) - 7.5(H)  Microalbumin <2.0 mg/dL - 7.6(H) 2.63(H) - -  Micro/Creat Ratio 0.0 - 30.0 mg/g - 20.8 22.6 - -  Chol - 172 138 120 - -  HDL >39 mg/dL - 41 37(L) - -  Calc LDL 0 - 99 mg/dL - 49 44 - -  Triglycerides <150 mg/dL - 239(H) 195(H) - -  Creatinine 0.50 - 1.10 mg/dL - 0.76 0.77 0.75 0.78   BP/Weight 12/29/2014 09/01/2014 04/28/2014 11/25/2013 09/17/2013 09/10/2013 [id number]  Systolic BP 0000000 123456 Q000111Q Q000111Q 0000000 [id number]  Diastolic BP 74 80 80 80 60 59 61  Wt. (Lbs) 255.8 245 256 279.12 279 278.3 277.8  BMI 37.76 36.16 37.79 41.2 41.18 41.08 41.01   Foot/eye exam completion dates Latest Ref Rng 04/28/2014 11/02/2013  Eye Exam No Retinopathy - No Retinopathy  Foot exam Order - - -  Foot Form Completion - Done -

## 2015-01-07 NOTE — Assessment & Plan Note (Signed)
Large deep cyst on upper back, recommend removal, she will discuss with her surgeon when she next sees him

## 2015-01-07 NOTE — Assessment & Plan Note (Signed)
Deteriorated. Patient re-educated about  the importance of commitment to a  minimum of 150 minutes of exercise per week.  The importance of healthy food choices with portion control discussed. Encouraged to start a food diary, count calories and to consider  joining a support group. Sample diet sheets offered. Goals set by the patient for the next several months.   Weight /BMI 12/29/2014 09/01/2014 04/28/2014  WEIGHT 255 lb 12.8 oz 245 lb 256 lb  HEIGHT 5\' 9"  5\' 9"  5\' 9"   BMI 37.76 kg/m2 36.16 kg/m2 37.79 kg/m2    Current exercise per week 60 minutes.

## 2015-01-07 NOTE — Assessment & Plan Note (Signed)
After obtaining informed consent, the vaccine is  administered by LPN.  

## 2015-01-25 LAB — HM DIABETES EYE EXAM

## 2015-03-16 IMAGING — MG MM RT PLC BREAST LOC DEV   1ST LESION  INC MAMMO GUIDE
4 series · 4 of 4 positions shown · non-contrast
Comparison: Previous exams.

CLINICAL DATA: Right breast ductal carcinoma in situ. Patient
presents for wire localization prior to lumpectomy.

EXAM:
NEEDLE LOCALIZATION OF THE RIGHT BREAST WITH MAMMO GUIDANCE

[R LM (1 of 2)]
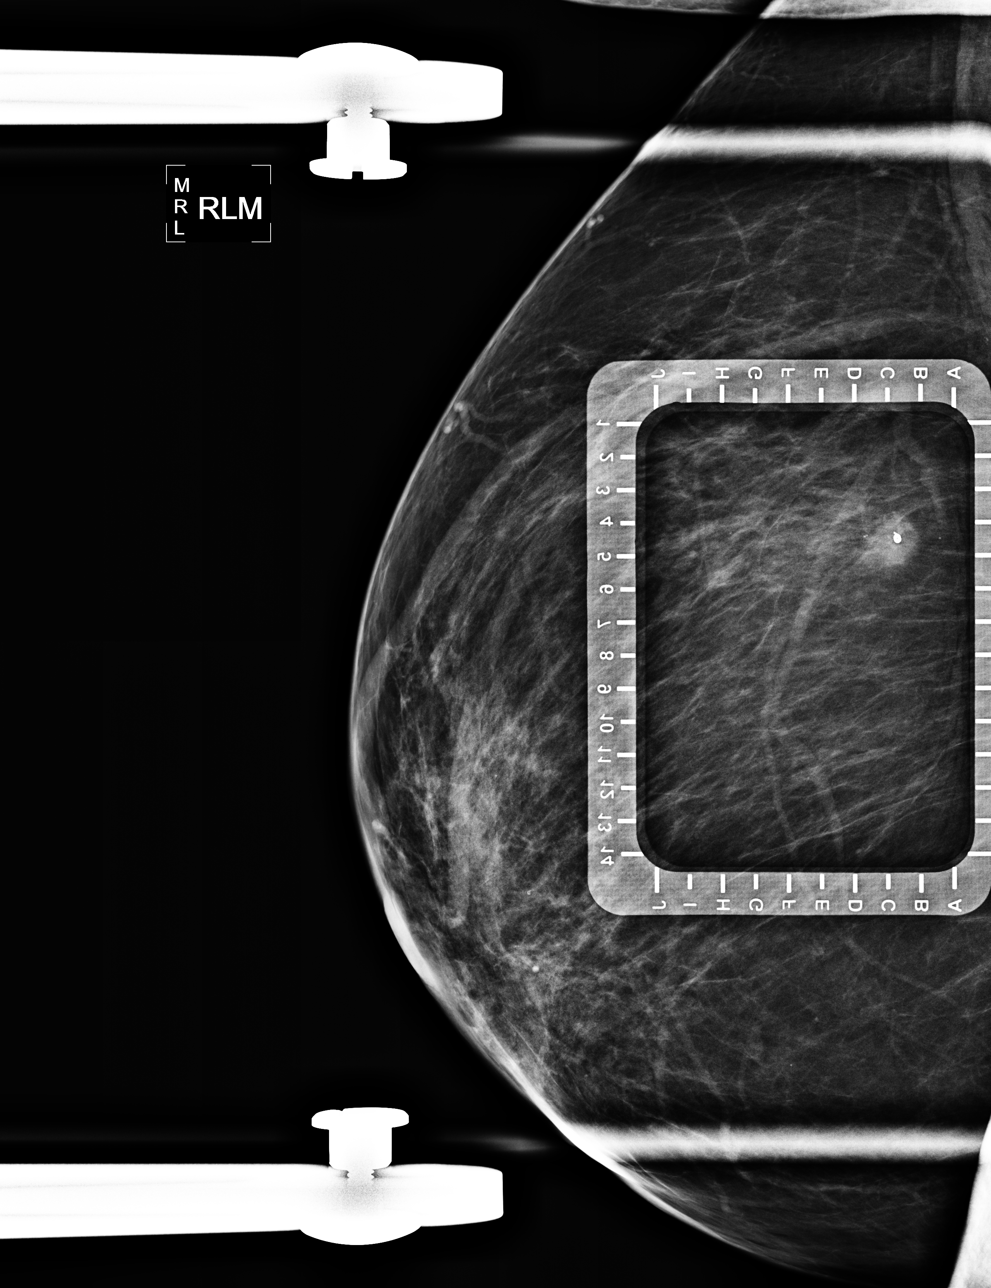

[R LM (2 of 2)]
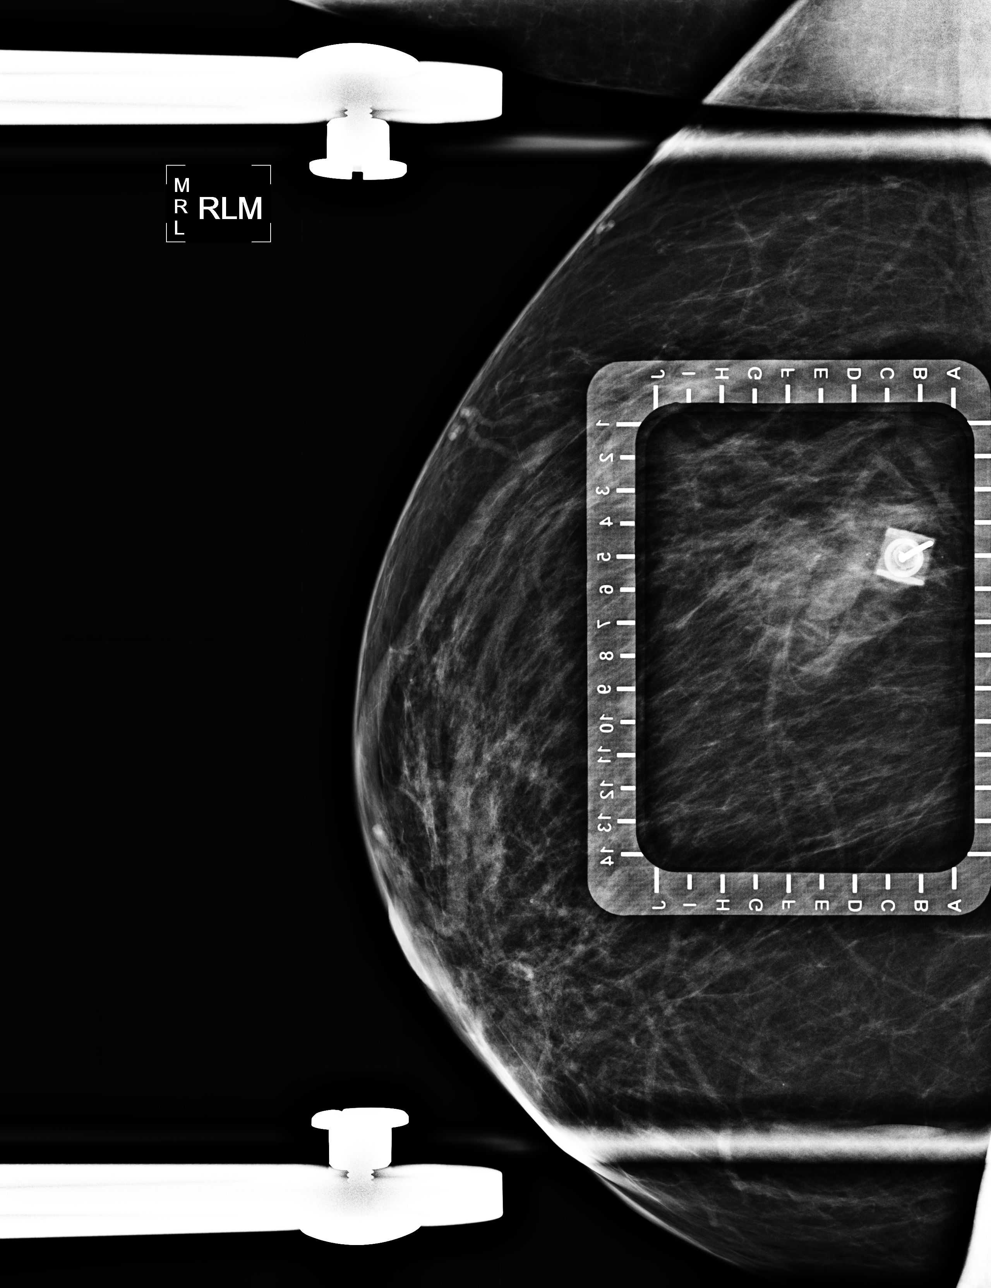

[R CC (1 of 2)]
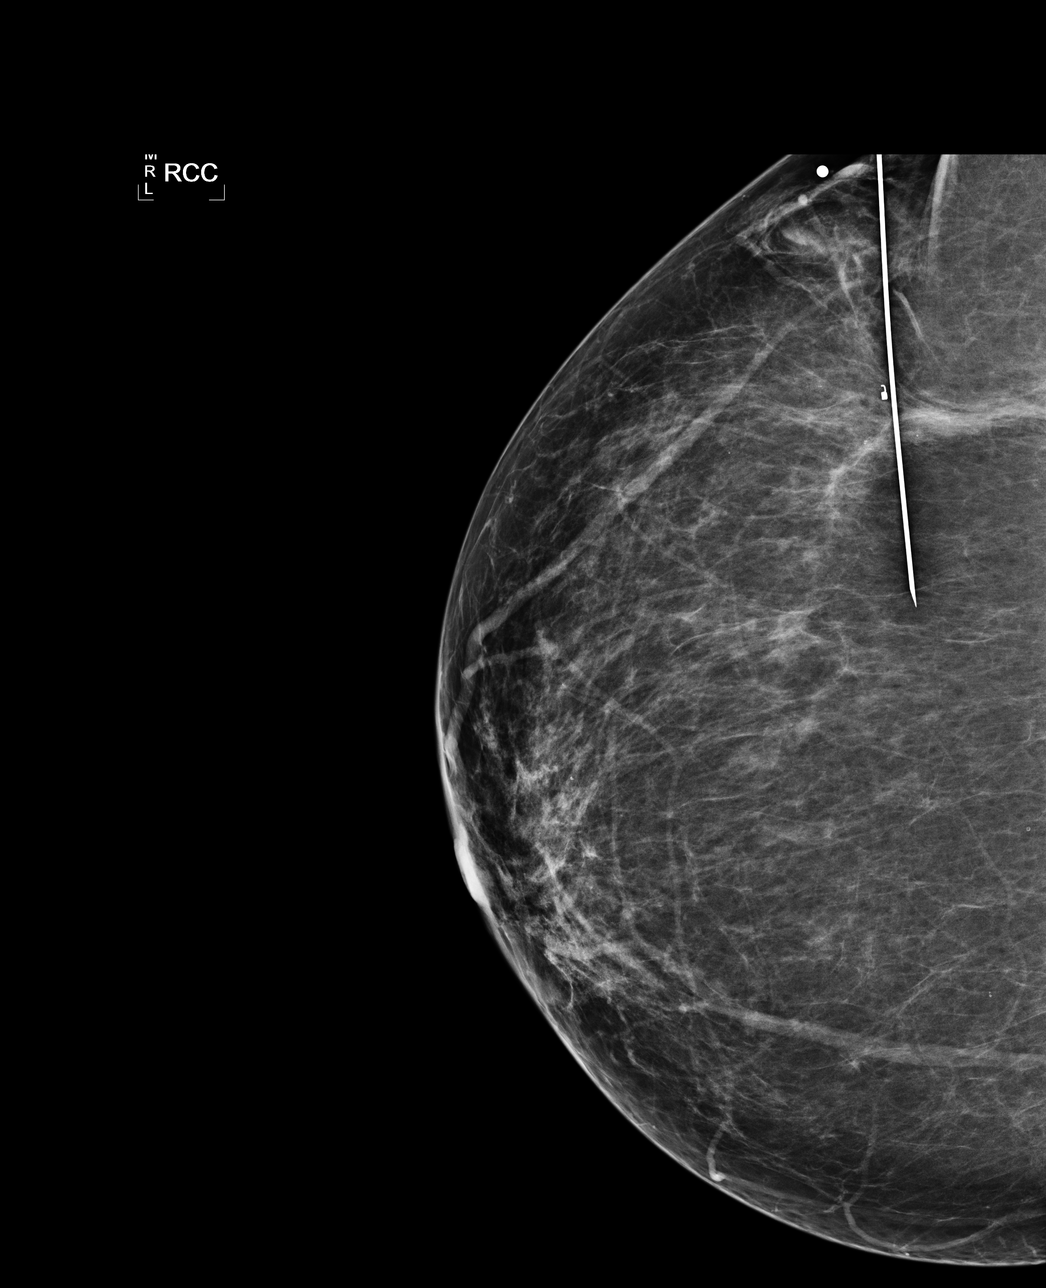

[R CC (2 of 2)]
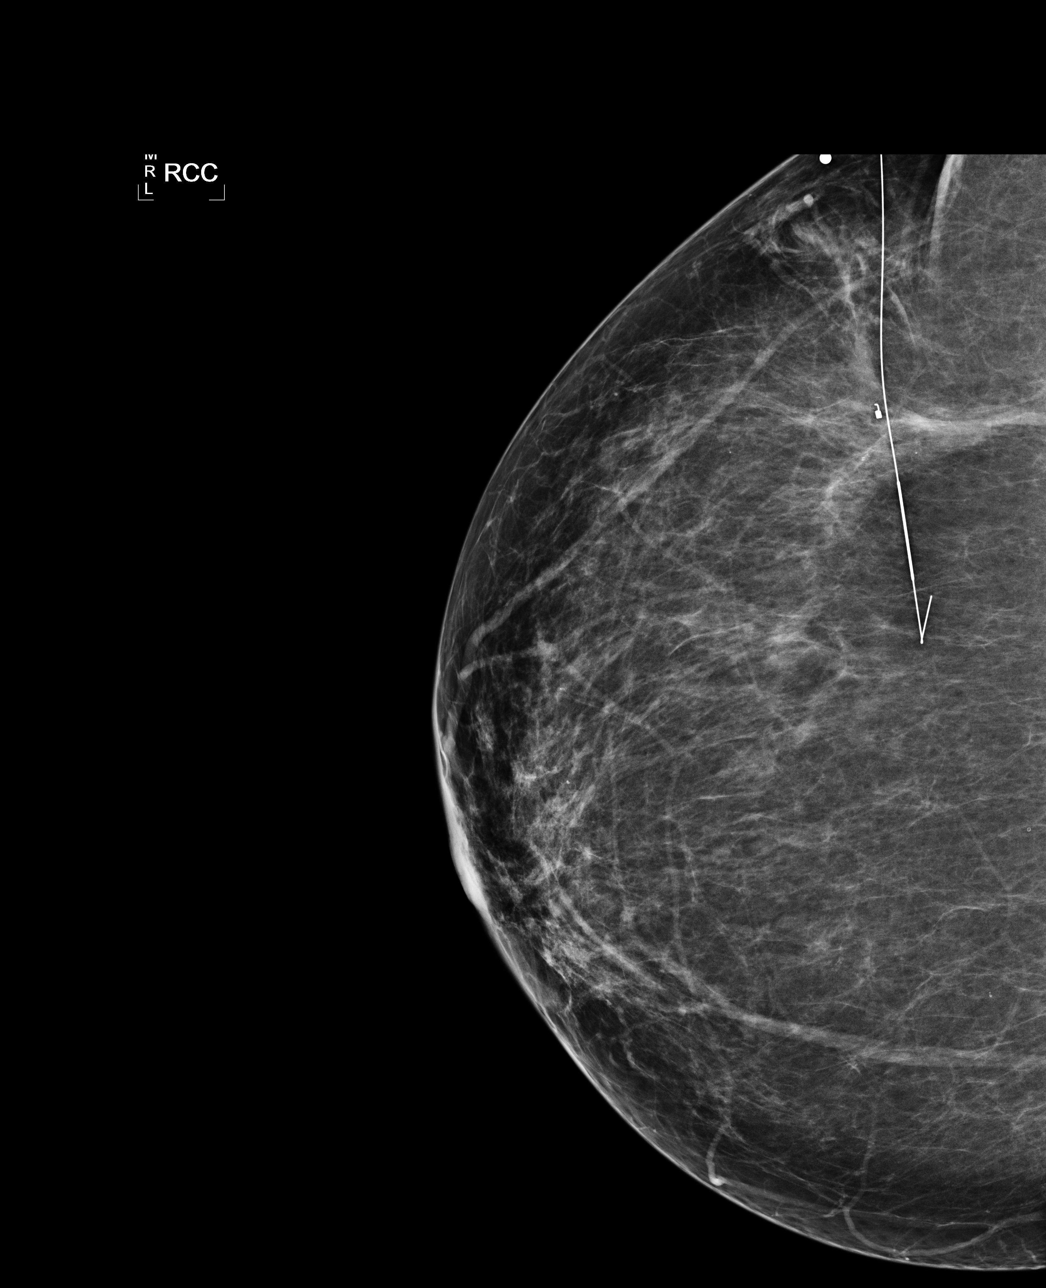

[4 of 4 positions shown; findings below may reference images not displayed]

FINDINGS: Patient presents for needle localization prior to lumpectomy. I met
with the patient and we discussed the procedure of needle
localization including benefits and alternatives. We discussed the
high likelihood of a successful procedure. We discussed the risks of
the procedure, including infection, bleeding, tissue injury, and
further surgery. Informed, written consent was given. The usual
time-out protocol was performed immediately prior to the procedure.

Using mammographic guidance, sterile technique, 2% lidocaine and a 9
cm modified Kopans needle, calcifications and clip are localized
using lateral approach. The films were marked for Dr. Tiger.
IMPRESSION: Needle localization of the right breast. No apparent complications.

## 2015-03-20 ENCOUNTER — Telehealth: Payer: Self-pay | Admitting: Family Medicine

## 2015-03-20 DIAGNOSIS — E785 Hyperlipidemia, unspecified: Secondary | ICD-10-CM

## 2015-03-20 MED ORDER — ATORVASTATIN CALCIUM 10 MG PO TABS: 10.0000 mg | ORAL_TABLET | Freq: Every day | ORAL | Status: DC

## 2015-03-20 NOTE — Telephone Encounter (Signed)
Medication refilled and ready for collection. 

## 2015-03-20 NOTE — Telephone Encounter (Signed)
Patient is asking for a refill on medication atorvastatin (LIPITOR) 10 MG tablet  #90 and she needs a written Rx and she wants to pick it up Tomorrow she has new insurance and she is changing pharmacy's

## 2015-03-22 ENCOUNTER — Other Ambulatory Visit: Payer: Self-pay | Admitting: Family Medicine

## 2015-03-22 DIAGNOSIS — C50911 Malignant neoplasm of unspecified site of right female breast: Secondary | ICD-10-CM

## 2015-03-22 DIAGNOSIS — Z9889 Other specified postprocedural states: Secondary | ICD-10-CM

## 2015-04-10 ENCOUNTER — Telehealth: Payer: Self-pay | Admitting: Family Medicine

## 2015-04-10 MED ORDER — AMLODIPINE BESYLATE 5 MG PO TABS: ORAL_TABLET | ORAL | Status: DC

## 2015-04-10 MED ORDER — METFORMIN HCL 1000 MG PO TABS: ORAL_TABLET | ORAL | Status: DC

## 2015-04-10 NOTE — Telephone Encounter (Signed)
meds refilled 

## 2015-04-10 NOTE — Telephone Encounter (Signed)
Patient is asking for refills on these 2 medications metFORMIN (GLUCOPHAGE) 1000 MG tablet and amLODipine (NORVASC) 5 MG tablet please call to [facility] in [place]

## 2015-04-14 ENCOUNTER — Ambulatory Visit
Admission: RE | Admit: 2015-04-14 | Discharge: 2015-04-14 | Disposition: A | Discharge status: Home / Self Care | Payer: PPO | Source: Ambulatory Visit | Attending: Family Medicine | Admitting: Family Medicine

## 2015-04-14 DIAGNOSIS — Z9889 Other specified postprocedural states: Secondary | ICD-10-CM

## 2015-04-14 DIAGNOSIS — C50911 Malignant neoplasm of unspecified site of right female breast: Secondary | ICD-10-CM

## 2015-04-14 DIAGNOSIS — R928 Other abnormal and inconclusive findings on diagnostic imaging of breast: Secondary | ICD-10-CM | POA: Diagnosis not present

## 2015-04-26 DIAGNOSIS — I10 Essential (primary) hypertension: Secondary | ICD-10-CM | POA: Diagnosis not present

## 2015-04-26 DIAGNOSIS — E1121 Type 2 diabetes mellitus with diabetic nephropathy: Secondary | ICD-10-CM | POA: Diagnosis not present

## 2015-04-26 DIAGNOSIS — E785 Hyperlipidemia, unspecified: Secondary | ICD-10-CM | POA: Diagnosis not present

## 2015-04-26 DIAGNOSIS — Z1159 Encounter for screening for other viral diseases: Secondary | ICD-10-CM | POA: Diagnosis not present

## 2015-04-26 DIAGNOSIS — E1129 Type 2 diabetes mellitus with other diabetic kidney complication: Secondary | ICD-10-CM | POA: Diagnosis not present

## 2015-04-27 LAB — COMPLETE METABOLIC PANEL WITH GFR
ALK PHOS: 54 U/L (ref 33–130)
ALT: 10 U/L (ref 6–29)
AST: 13 U/L (ref 10–35)
Albumin: 4.3 g/dL (ref 3.6–5.1)
BILIRUBIN TOTAL: 0.6 mg/dL (ref 0.2–1.2)
BUN: 8 mg/dL (ref 7–25)
CO2: 27 mmol/L (ref 20–31)
Calcium: 9.3 mg/dL (ref 8.6–10.4)
Chloride: 102 mmol/L (ref 98–110)
Creat: 0.76 mg/dL (ref 0.50–0.99)
GFR, EST NON AFRICAN AMERICAN: 80 mL/min (ref >=60)
GLUCOSE: 116 mg/dL — AB (ref 65–99)
POTASSIUM: 4.4 mmol/L (ref 3.5–5.3)
SODIUM: 138 mmol/L (ref 135–146)
Total Protein: 7.3 g/dL (ref 6.1–8.1)

## 2015-04-27 LAB — LIPID PANEL
CHOL/HDL RATIO: 3.5 ratio (ref <=5.0)
Cholesterol: 172 mg/dL (ref 125–200)
HDL: 49 mg/dL (ref >=46)
LDL Cholesterol: 79 mg/dL (ref <130)
Triglycerides: 221 mg/dL — ABNORMAL HIGH (ref <150)
VLDL: 44 mg/dL — ABNORMAL HIGH (ref <30)

## 2015-04-27 LAB — HEMOGLOBIN A1C
Hgb A1c MFr Bld: 7.1 % — ABNORMAL HIGH (ref <5.7)
MEAN PLASMA GLUCOSE: 157 mg/dL — AB (ref <117)

## 2015-04-27 LAB — HEPATITIS C ANTIBODY: HCV Ab: NEGATIVE

## 2015-04-27 LAB — TSH: TSH: 1.69 m[IU]/L

## 2015-05-03 ENCOUNTER — Ambulatory Visit (INDEPENDENT_AMBULATORY_CARE_PROVIDER_SITE_OTHER): Payer: PPO | Admitting: Family Medicine

## 2015-05-03 ENCOUNTER — Encounter: Payer: Self-pay | Admitting: Family Medicine

## 2015-05-03 VITALS — BP 142/78 | HR 70 | Resp 18 | Ht 69.0 in | Wt 267.1 lb

## 2015-05-03 DIAGNOSIS — E1121 Type 2 diabetes mellitus with diabetic nephropathy: Secondary | ICD-10-CM

## 2015-05-03 DIAGNOSIS — I10 Essential (primary) hypertension: Secondary | ICD-10-CM

## 2015-05-03 DIAGNOSIS — E785 Hyperlipidemia, unspecified: Secondary | ICD-10-CM

## 2015-05-03 DIAGNOSIS — Z0000 Encounter for general adult medical examination without abnormal findings: Secondary | ICD-10-CM

## 2015-05-03 DIAGNOSIS — E559 Vitamin D deficiency, unspecified: Secondary | ICD-10-CM

## 2015-05-03 MED ORDER — BISOPROLOL-HYDROCHLOROTHIAZIDE 10-6.25 MG PO TABS: 1.0000 | ORAL_TABLET | Freq: Every day | ORAL | Status: DC

## 2015-05-03 NOTE — Progress Notes (Signed)
Subjective:    Patient ID: Theresa Adkins, female    DOB: [birth year], 70 y.o.   MRN: [id number]  HPI Preventive Screening-Counseling & Management   Patient present here today for a Medicare annual wellness visit.   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 Yes Over the past two weeks, have you felt little interest or pleasure in doing things? Yes Have you lost interest or pleasure in daily life? No  Do you often feel hopeless? No  Do you cry easily over simple problems? No   Activities of Daily Living  In your present state of health, do you have any difficulty performing the following activities?  Driving?: No Managing money?: No Feeding yourself?:No Getting from bed to chair?:No Climbing a flight of stairs?:No Preparing food and eating?:No Bathing or showering?:No Getting dressed?:No Getting to the toilet?:No Using the toilet?:No Moving around from place to place?: No  Fall Risk Assessment In the past year have you fallen or had a near fall?:No Are you currently taking any medications that make you dizzy?:No   Hearing Difficulties: No Do you often ask people to speak up or repeat themselves?:No Do you experience ringing or noises in your ears?:No Do you have difficulty understanding soft or whispered voices?:No  Cognitive Testing  Alert? Yes Normal Appearance?Yes  Oriented to person? Yes Place? Yes  Time? Yes    Displays appropriate judgment?Yes  Can read the correct time from a watch face? yes Are you having problems remembering things?No age appropriate   Advanced Directives have been discussed with the patient?Yes and brochure/forms provided    List the Names of Other Physician/Practitioners you currently use: careteams updated    Indicate any recent Medical Services you may have received from other than [facility] providers in the past year (date may be approximate).   Assessment:    Annual Wellness Exam   Plan:    Patient Instructions (the written plan) was given to the patient.  Medicare Attestation  I have personally reviewed:  The patient's medical and social history  Their use of alcohol, tobacco or illicit drugs  Their current medications and supplements  The patient's functional ability including ADLs,fall risks, home safety risks, cognitive, and hearing and visual impairment  Diet and physical activities  Evidence for depression or mood disorders  The patient's weight, height, BMI, and visual acuity have been recorded in the chart. I have made referrals, counseling, and provided education to the patient based on review of the above and I have provided the patient with a written personalized care plan for preventive services.     Review of Systems     Objective:   Physical Exam BP 142/78 mmHg  Pulse 70  Resp 18  Ht 5\' 9"  (1.753 m)  Wt 267 lb 1.9 oz (121.165 kg)  BMI 39.43 kg/m2  SpO2 94%      Assessment & Plan:  Medicare annual wellness visit, subsequent Annual  exam as documented. Counseling done  re healthy lifestyle involving commitment to 150 minutes exercise per week, heart healthy diet, and attaining healthy weight.The importance of adequate sleep also discussed. Regular seat belt use and home safety, is also discussed. Changes in health habits are decided on by the patient with goals and time frames  set for achieving them. Immunization and cancer screening  needs are specifically addressed at this visit.

## 2015-05-03 NOTE — Patient Instructions (Addendum)
Physical exam in 4 month, call if you need me before  Fasting labs in 4 month  Please work on good  health habits so that your health will improve. 1. Commitment to daily physical activity for 30 to 60  minutes, if you are able to do this.  2. Commitment to wise food choices. Aim for half of your  food intake to be vegetable and fruit, one quarter starchy foods, and one quarter protein. Try to eat on a regular schedule  3 meals per day, snacking between meals should be limited to vegetables or fruits or small portions of nuts. 64 ounces of water per day is generally recommended, unless you have specific health conditions, like heart failure or kidney failure where you will need to limit fluid intake.  3. Commitment to sufficient and a  good quality of physical and mental rest daily, generally between 6 to 8 hours per day.  WITH PERSISTANCE AND PERSEVERANCE, THE IMPOSSIBLE , BECOMES THE NORM! Thanks for choosing [facility], we consider it a privelige to serve you. Use clotrimazole / betamethasone creram twice daily for 10 days to rash on right side of neck  Excellent foot exam today

## 2015-05-03 NOTE — Assessment & Plan Note (Signed)

## 2015-06-01 DIAGNOSIS — D0511 Intraductal carcinoma in situ of right breast: Secondary | ICD-10-CM | POA: Diagnosis not present

## 2015-09-12 DIAGNOSIS — I10 Essential (primary) hypertension: Secondary | ICD-10-CM | POA: Diagnosis not present

## 2015-09-12 DIAGNOSIS — E1121 Type 2 diabetes mellitus with diabetic nephropathy: Secondary | ICD-10-CM | POA: Diagnosis not present

## 2015-09-12 DIAGNOSIS — E1129 Type 2 diabetes mellitus with other diabetic kidney complication: Secondary | ICD-10-CM | POA: Diagnosis not present

## 2015-09-12 DIAGNOSIS — E785 Hyperlipidemia, unspecified: Secondary | ICD-10-CM | POA: Diagnosis not present

## 2015-09-12 DIAGNOSIS — E559 Vitamin D deficiency, unspecified: Secondary | ICD-10-CM | POA: Diagnosis not present

## 2015-09-12 LAB — CBC
HEMATOCRIT: 37.4 % (ref 35.0–45.0)
HEMOGLOBIN: 12.3 g/dL (ref 11.7–15.5)
MCH: 27 pg (ref 27.0–33.0)
MCHC: 32.9 g/dL (ref 32.0–36.0)
MCV: 82.2 fL (ref 80.0–100.0)
MPV: 10.3 fL (ref 7.5–12.5)
Platelets: 282 10*3/uL (ref 140–400)
RBC: 4.55 MIL/uL (ref 3.80–5.10)
RDW: 16.3 % — AB (ref 11.0–15.0)
WBC: 5.3 10*3/uL (ref 3.8–10.8)

## 2015-09-13 LAB — COMPLETE METABOLIC PANEL WITH GFR
ALBUMIN: 4.4 g/dL (ref 3.6–5.1)
ALK PHOS: 56 U/L (ref 33–130)
ALT: 11 U/L (ref 6–29)
AST: 11 U/L (ref 10–35)
BUN: 7 mg/dL (ref 7–25)
CALCIUM: 9 mg/dL (ref 8.6–10.4)
CO2: 25 mmol/L (ref 20–31)
CREATININE: 0.73 mg/dL (ref 0.50–0.99)
Chloride: 105 mmol/L (ref 98–110)
GFR, Est African American: >89 mL/min (ref >=60)
GFR, Est Non African American: 84 mL/min (ref >=60)
GLUCOSE: 127 mg/dL — AB (ref 65–99)
Potassium: 4.4 mmol/L (ref 3.5–5.3)
SODIUM: 143 mmol/L (ref 135–146)
TOTAL PROTEIN: 7.1 g/dL (ref 6.1–8.1)
Total Bilirubin: 0.5 mg/dL (ref 0.2–1.2)

## 2015-09-13 LAB — LIPID PANEL
CHOLESTEROL: 165 mg/dL (ref 125–200)
HDL: 51 mg/dL (ref >=46)
LDL CALC: 70 mg/dL (ref <130)
TRIGLYCERIDES: 219 mg/dL — AB (ref <150)
Total CHOL/HDL Ratio: 3.2 Ratio (ref <=5.0)
VLDL: 44 mg/dL — ABNORMAL HIGH (ref <30)

## 2015-09-13 LAB — HEMOGLOBIN A1C
Hgb A1c MFr Bld: 7.3 % — ABNORMAL HIGH (ref <5.7)
Mean Plasma Glucose: 163 mg/dL

## 2015-09-13 LAB — VITAMIN D 25 HYDROXY (VIT D DEFICIENCY, FRACTURES): Vit D, 25-Hydroxy: 20 ng/mL — ABNORMAL LOW (ref 30–100)

## 2015-09-18 ENCOUNTER — Encounter: Payer: Self-pay | Admitting: Family Medicine

## 2015-09-18 ENCOUNTER — Ambulatory Visit (INDEPENDENT_AMBULATORY_CARE_PROVIDER_SITE_OTHER): Payer: PPO | Admitting: Family Medicine

## 2015-09-18 ENCOUNTER — Other Ambulatory Visit: Payer: Self-pay

## 2015-09-18 VITALS — BP 120/72 | HR 73 | Resp 16 | Ht 69.0 in | Wt 270.0 lb

## 2015-09-18 DIAGNOSIS — E559 Vitamin D deficiency, unspecified: Secondary | ICD-10-CM

## 2015-09-18 DIAGNOSIS — E785 Hyperlipidemia, unspecified: Secondary | ICD-10-CM

## 2015-09-18 DIAGNOSIS — L732 Hidradenitis suppurativa: Secondary | ICD-10-CM

## 2015-09-18 DIAGNOSIS — E1121 Type 2 diabetes mellitus with diabetic nephropathy: Secondary | ICD-10-CM

## 2015-09-18 DIAGNOSIS — I10 Essential (primary) hypertension: Secondary | ICD-10-CM | POA: Diagnosis not present

## 2015-09-18 DIAGNOSIS — E1140 Type 2 diabetes mellitus with diabetic neuropathy, unspecified: Secondary | ICD-10-CM | POA: Diagnosis not present

## 2015-09-18 MED ORDER — AMLODIPINE BESYLATE 5 MG PO TABS: ORAL_TABLET | ORAL | 1 refills | Status: DC

## 2015-09-18 MED ORDER — VITAMIN D (ERGOCALCIFEROL) 1.25 MG (50000 UNIT) PO CAPS: 50000.0000 [IU] | ORAL_CAPSULE | ORAL | 0 refills | Status: DC

## 2015-09-18 MED ORDER — BISOPROLOL-HYDROCHLOROTHIAZIDE 10-6.25 MG PO TABS: 1.0000 | ORAL_TABLET | Freq: Every day | ORAL | 1 refills | Status: DC

## 2015-09-18 MED ORDER — METFORMIN HCL 1000 MG PO TABS: ORAL_TABLET | ORAL | 1 refills | Status: DC

## 2015-09-18 MED ORDER — ATORVASTATIN CALCIUM 10 MG PO TABS: 10.0000 mg | ORAL_TABLET | Freq: Every day | ORAL | 1 refills | Status: DC

## 2015-09-18 NOTE — Assessment & Plan Note (Signed)
Elevated tG Hyperlipidemia:Low fat diet discussed and encouraged.   Lipid Panel  Lab Results  Component Value Date   CHOL 165 09/12/2015   HDL 51 09/12/2015   LDLCALC 70 09/12/2015   TRIG 219 (H) 09/12/2015   CHOLHDL 3.2 09/12/2015     Updated lab needed at/ before next visit.

## 2015-09-18 NOTE — Assessment & Plan Note (Signed)
Deteriorated. Patient re-educated about  the importance of commitment to a  minimum of 150 minutes of exercise per week.  The importance of healthy food choices with portion control discussed. Encouraged to start a food diary, count calories and to consider  joining a support group. Sample diet sheets offered. Goals set by the patient for the next several months.   Weight /BMI 09/18/2015 05/03/2015 12/29/2014  WEIGHT 270 lb 267 lb 1.9 oz 255 lb 12.8 oz  HEIGHT 5\' 9"  5\' 9"  5\' 9"   BMI 39.87 kg/m2 39.43 kg/m2 37.76 kg/m2

## 2015-09-18 NOTE — Assessment & Plan Note (Signed)
Controlled, no change in medication Theresa Adkins is reminded of the importance of commitment to daily physical activity for 30 minutes or more, as able and the need to limit carbohydrate intake to 30 to 60 grams per meal to help with blood sugar control.   The need to take medication as prescribed, test blood sugar as directed, and to call between visits if there is a concern that blood sugar is uncontrolled is also discussed.   Theresa Adkins is reminded of the importance of daily foot exam, annual eye examination, and good blood sugar, blood pressure and cholesterol control.  Diabetic Labs Latest Ref Rng & Units 09/12/2015 04/26/2015 12/23/2014 11/22/2013 07/13/2013  HbA1c <5.7 % 7.3(H) 7.1(H) - 7.2(H) 7.1(H)  Microalbumin <2.0 mg/dL - - - 7.6(H) 2.63(H)  Micro/Creat Ratio 0.0 - 30.0 mg/g - - - 20.8 22.6  Chol 125 - 200 mg/dL 165 172 172 138 120  HDL >=46 mg/dL 51 49 - 41 37(L)  Calc LDL <130 mg/dL 70 79 - 49 44  Triglycerides <150 mg/dL 219(H) 221(H) - 239(H) 195(H)  Creatinine 0.50 - 0.99 mg/dL 0.73 0.76 - 0.76 0.77   BP/Weight 09/18/2015 05/03/2015 12/29/2014 09/01/2014 04/28/2014 11/25/2013 99991111  Systolic BP Q000111Q A999333 0000000 123456 Q000111Q Q000111Q 0000000  Diastolic BP 82 78 74 80 80 80 60  Wt. (Lbs) 270 267.12 255.8 245 256 279.12 279  BMI 39.87 39.43 37.76 36.16 37.79 41.2 41.18   Foot/eye exam completion dates Latest Ref Rng & Units 05/03/2015 01/25/2015  Eye Exam No Retinopathy - No Retinopathy  Foot exam Order - - -  Foot Form Completion - Done -      Needs to improve

## 2015-09-18 NOTE — Assessment & Plan Note (Signed)
Controlled, no change in medication DASH diet and commitment to daily physical activity for a minimum of 30 minutes discussed and encouraged, as a part of hypertension management. The importance of attaining a healthy weight is also discussed.  BP/Weight 09/18/2015 05/03/2015 12/29/2014 09/01/2014 04/28/2014 11/25/2013 99991111  Systolic BP Q000111Q A999333 0000000 123456 Q000111Q Q000111Q 0000000  Diastolic BP 82 78 74 80 80 80 60  Wt. (Lbs) 270 267.12 255.8 245 256 279.12 279  BMI 39.87 39.43 37.76 36.16 37.79 41.2 41.18

## 2015-09-18 NOTE — Patient Instructions (Signed)
F/u with opelvic and pap in 4 month, call if you need me sooner  New is weekly vit D   Pls change eating and start daily exercise  Fasting lipid, cmp and EGFR and hBa1C in 4 months  Come for flu vaccine in Sept and bring back stool cards  Microalb today  Thank you  for choosing  Primary Care. We consider it a privelige to serve you.  Delivering excellent health care in a caring and  compassionate way is our goal.  Partnering with you,  so that together we can achieve this goal is our strategy.  Foot exam qualifies  You for shoes   .

## 2015-09-18 NOTE — Progress Notes (Signed)
Theresa Adkins     MRN: [id number]      DOB: 1945/05/03   HPI Theresa Adkins is here for follow up and re-evaluation of chronic medical conditions, medication management and review of any available recent lab and radiology data.  Preventive health is updated, specifically  Cancer screening and Immunization.   Questions or concerns regarding consultations or procedures which the PT has had in the interim are  addressed. The PT denies any adverse reactions to current medications since the last visit.  There are no new concerns.  There are no specific complaints   ROS Denies recent fever or chills. Denies sinus pressure, nasal congestion, ear pain or sore throat. Denies chest congestion, productive cough or wheezing. Denies chest pains, palpitations and leg swelling Denies abdominal pain, nausea, vomiting,diarrhea or constipation.   Denies dysuria, frequency, hesitancy or incontinence. Denies joint pain, swelling and limitation in mobility. Denies headaches, seizures, numbness, or tingling. Denies depression, anxiety or insomnia. Denies skin break down or rash.   PE  BP 134/82 (BP Location: Left Arm, Patient Position: Sitting, Cuff Size: Large)   Pulse 73   Resp 16   Ht 5\' 9"  (1.753 m)   Wt 270 lb (122.5 kg)   SpO2 96%   BMI 39.87 kg/m   Patient alert and oriented and in no cardiopulmonary distress.  HEENT: No facial asymmetry, EOMI,   oropharynx pink and moist.  Neck supple no JVD, no mass.  Chest: Clear to auscultation bilaterally.  CVS: S1, S2 no murmurs, no S3.Regular rate.  ABD: Soft non tender.   Ext: No edema  MS: Adequate ROM spine, shoulders, hips and knees.  Skin: Intact, no ulcerations or rash noted.  Psych: Good eye contact, normal affect. Memory intact not anxious or depressed appearing.  CNS: CN 2-12 intact, power,  normal throughout.no focal deficits noted.   Assessment & Plan  Vitamin D deficiency Uncontrolled, start weekly vit D  Hypertension  goal BP (blood pressure) < 130/80 Controlled, no change in medication DASH diet and commitment to daily physical activity for a minimum of 30 minutes discussed and encouraged, as a part of hypertension management. The importance of attaining a healthy weight is also discussed.  BP/Weight 09/18/2015 05/03/2015 12/29/2014 09/01/2014 04/28/2014 11/25/2013 99991111  Systolic BP Q000111Q [id number] 0000000 [id number] Q000111Q Q000111Q 0000000  Diastolic BP 82 78 74 80 80 80 60  Wt. (Lbs) 270 267.12 255.8 245 256 279.12 279  BMI 39.87 39.43 37.76 36.16 37.79 41.2 41.18       Controlled type 2 diabetes with neuropathy (HCC) Controlled, no change in medication Theresa Adkins is reminded of the importance of commitment to daily physical activity for 30 minutes or more, as able and the need to limit carbohydrate intake to 30 to 60 grams per meal to help with blood sugar control.   The need to take medication as prescribed, test blood sugar as directed, and to call between visits if there is a concern that blood sugar is uncontrolled is also discussed.   Theresa Adkins is reminded of the importance of daily foot exam, annual eye examination, and good blood sugar, blood pressure and cholesterol control.  Diabetic Labs Latest Ref Rng & Units 09/12/2015 04/26/2015 12/23/2014 11/22/2013 07/13/2013  HbA1c <5.7 % 7.3(H) 7.1(H) - 7.2(H) 7.1(H)  Microalbumin <2.0 mg/dL - - - 7.6(H) 2.63(H)  Micro/Creat Ratio 0.0 - 30.0 mg/g - - - 20.8 22.6  Chol 125 - 200 mg/dL 165 172 172 138 120  HDL >=46 mg/dL  51 49 - 41 37(L)  Calc LDL <130 mg/dL 70 79 - 49 44  Triglycerides <150 mg/dL 219(H) 221(H) - 239(H) 195(H)  Creatinine 0.50 - 0.99 mg/dL 0.73 0.76 - 0.76 0.77   BP/Weight 09/18/2015 05/03/2015 12/29/2014 09/01/2014 04/28/2014 11/25/2013 99991111  Systolic BP Q000111Q A999333 0000000 123456 Q000111Q Q000111Q 0000000  Diastolic BP 82 78 74 80 80 80 60  Wt. (Lbs) 270 267.12 255.8 245 256 279.12 279  BMI 39.87 39.43 37.76 36.16 37.79 41.2 41.18   Foot/eye exam completion dates Latest Ref Rng &  Units 05/03/2015 01/25/2015  Eye Exam No Retinopathy - No Retinopathy  Foot exam Order - - -  Foot Form Completion - Done -      Needs to improve  HIDRADENITIS Reports current flare in pelvic area and perineum, refusing physical exam today, will take stool cards  Obesity, Class III, BMI 40-49.9 (morbid obesity) Deteriorated. Patient re-educated about  the importance of commitment to a  minimum of 150 minutes of exercise per week.  The importance of healthy food choices with portion control discussed. Encouraged to start a food diary, count calories and to consider  joining a support group. Sample diet sheets offered. Goals set by the patient for the next several months.   Weight /BMI 09/18/2015 05/03/2015 12/29/2014  WEIGHT 270 lb 267 lb 1.9 oz 255 lb 12.8 oz  HEIGHT 5\' 9"  5\' 9"  5\' 9"   BMI 39.87 kg/m2 39.43 kg/m2 37.76 kg/m2      Hyperlipidemia Elevated tG Hyperlipidemia:Low fat diet discussed and encouraged.   Lipid Panel  Lab Results  Component Value Date   CHOL 165 09/12/2015   HDL 51 09/12/2015   LDLCALC 70 09/12/2015   TRIG 219 (H) 09/12/2015   CHOLHDL 3.2 09/12/2015     Updated lab needed at/ before next visit.

## 2015-09-18 NOTE — Addendum Note (Signed)
Addended by: Eual Fines on: 09/18/2015 02:25 PM   Modules accepted: Orders

## 2015-09-18 NOTE — Assessment & Plan Note (Addendum)
Reports current flare in pelvic area and perineum, refusing physical exam today, will take stool cards

## 2015-09-18 NOTE — Assessment & Plan Note (Signed)
Uncontrolled, start weekly vit D

## 2015-09-19 ENCOUNTER — Other Ambulatory Visit (HOSPITAL_COMMUNITY)
Admission: RE | Admit: 2015-09-19 | Discharge: 2015-09-19 | Disposition: A | Discharge status: Home / Self Care | Payer: PPO | Source: Ambulatory Visit | Attending: Family Medicine | Admitting: Family Medicine

## 2015-09-19 DIAGNOSIS — E1121 Type 2 diabetes mellitus with diabetic nephropathy: Secondary | ICD-10-CM | POA: Diagnosis not present

## 2015-09-20 LAB — MICROALBUMIN / CREATININE URINE RATIO
CREATININE, UR: 127 mg/dL
Microalb Creat Ratio: 7.4 mg/g creat (ref 0.0–30.0)
Microalb, Ur: 9.4 ug/mL — ABNORMAL HIGH

## 2015-12-21 ENCOUNTER — Ambulatory Visit (INDEPENDENT_AMBULATORY_CARE_PROVIDER_SITE_OTHER): Payer: PPO

## 2015-12-21 ENCOUNTER — Telehealth: Payer: Self-pay | Admitting: Family Medicine

## 2015-12-21 DIAGNOSIS — Z23 Encounter for immunization: Secondary | ICD-10-CM

## 2015-12-22 DIAGNOSIS — D0511 Intraductal carcinoma in situ of right breast: Secondary | ICD-10-CM | POA: Diagnosis not present

## 2015-12-22 NOTE — Telephone Encounter (Signed)
Orders pended

## 2016-01-15 ENCOUNTER — Other Ambulatory Visit: Payer: Self-pay | Admitting: Family Medicine

## 2016-01-15 DIAGNOSIS — E559 Vitamin D deficiency, unspecified: Secondary | ICD-10-CM | POA: Diagnosis not present

## 2016-01-15 DIAGNOSIS — E785 Hyperlipidemia, unspecified: Secondary | ICD-10-CM | POA: Diagnosis not present

## 2016-01-15 DIAGNOSIS — E1121 Type 2 diabetes mellitus with diabetic nephropathy: Secondary | ICD-10-CM | POA: Diagnosis not present

## 2016-01-15 LAB — HEMOGLOBIN A1C
HEMOGLOBIN A1C: 7 % — AB (ref <5.7)
Mean Plasma Glucose: 154 mg/dL

## 2016-01-16 LAB — COMPLETE METABOLIC PANEL WITH GFR
ALBUMIN: 4.1 g/dL (ref 3.6–5.1)
ALK PHOS: 59 U/L (ref 33–130)
ALT: 12 U/L (ref 6–29)
AST: 12 U/L (ref 10–35)
BILIRUBIN TOTAL: 0.5 mg/dL (ref 0.2–1.2)
BUN: 12 mg/dL (ref 7–25)
CALCIUM: 9 mg/dL (ref 8.6–10.4)
CO2: 28 mmol/L (ref 20–31)
CREATININE: 0.77 mg/dL (ref 0.60–0.93)
Chloride: 102 mmol/L (ref 98–110)
GFR, Est African American: >89 mL/min (ref >=60)
GFR, Est Non African American: 78 mL/min (ref >=60)
GLUCOSE: 139 mg/dL — AB (ref 65–99)
POTASSIUM: 4 mmol/L (ref 3.5–5.3)
SODIUM: 140 mmol/L (ref 135–146)
TOTAL PROTEIN: 7 g/dL (ref 6.1–8.1)

## 2016-01-16 LAB — LIPID PANEL
CHOL/HDL RATIO: 3.4 ratio (ref <5.0)
CHOLESTEROL: 155 mg/dL (ref <200)
HDL: 46 mg/dL — ABNORMAL LOW (ref >50)
LDL Cholesterol: 66 mg/dL (ref <100)
Triglycerides: 216 mg/dL — ABNORMAL HIGH (ref <150)
VLDL: 43 mg/dL — ABNORMAL HIGH (ref <30)

## 2016-01-19 ENCOUNTER — Encounter: Payer: Self-pay | Admitting: Family Medicine

## 2016-01-19 ENCOUNTER — Ambulatory Visit (INDEPENDENT_AMBULATORY_CARE_PROVIDER_SITE_OTHER): Payer: PPO | Admitting: Family Medicine

## 2016-01-19 ENCOUNTER — Other Ambulatory Visit (HOSPITAL_COMMUNITY)
Admission: RE | Admit: 2016-01-19 | Discharge: 2016-01-19 | Disposition: A | Discharge status: Home / Self Care | Payer: PPO | Source: Ambulatory Visit | Attending: Family Medicine | Admitting: Family Medicine

## 2016-01-19 VITALS — BP 132/78 | HR 78 | Resp 18 | Ht 69.0 in | Wt 276.0 lb

## 2016-01-19 DIAGNOSIS — Z23 Encounter for immunization: Secondary | ICD-10-CM | POA: Diagnosis not present

## 2016-01-19 DIAGNOSIS — Z124 Encounter for screening for malignant neoplasm of cervix: Secondary | ICD-10-CM | POA: Insufficient documentation

## 2016-01-19 DIAGNOSIS — E559 Vitamin D deficiency, unspecified: Secondary | ICD-10-CM

## 2016-01-19 DIAGNOSIS — Z1211 Encounter for screening for malignant neoplasm of colon: Secondary | ICD-10-CM

## 2016-01-19 DIAGNOSIS — E66813 Obesity, class 3: Secondary | ICD-10-CM

## 2016-01-19 DIAGNOSIS — E1140 Type 2 diabetes mellitus with diabetic neuropathy, unspecified: Secondary | ICD-10-CM

## 2016-01-19 DIAGNOSIS — E782 Mixed hyperlipidemia: Secondary | ICD-10-CM

## 2016-01-19 DIAGNOSIS — Z0000 Encounter for general adult medical examination without abnormal findings: Secondary | ICD-10-CM | POA: Diagnosis not present

## 2016-01-19 DIAGNOSIS — D259 Leiomyoma of uterus, unspecified: Secondary | ICD-10-CM

## 2016-01-19 LAB — HEMOCCULT GUIAC POC 1CARD (OFFICE): FECAL OCCULT BLD: NEGATIVE

## 2016-01-19 MED ORDER — VITAMIN D (ERGOCALCIFEROL) 1.25 MG (50000 UNIT) PO CAPS: 50000.0000 [IU] | ORAL_CAPSULE | ORAL | 1 refills | Status: DC

## 2016-01-19 NOTE — Progress Notes (Signed)
    Theresa Adkins     MRN: [id number]      DOB: 10-29-1945  HPI: Patient is in for annual physical exam. No other health concerns are expressed or addressed at the visit. Recent labs, if available are reviewed. Immunization is reviewed , and  updated if needed.   PE: BP 132/78   Pulse 78   Resp 18   Ht 5\' 9"  (1.753 m)   Wt 276 lb (125.2 kg)   SpO2 96%   BMI 40.76 kg/m   Pleasant  female, alert and oriented x 3, in no cardio-pulmonary distress. Afebrile. HEENT No facial trauma or asymetry. Sinuses non tender.  Extra occullar muscles intact,. External ears normal, tympanic membranes clear. Oropharynx moist, no exudate. Neck: supple, no adenopathy,JVD or thyromegaly.No bruits.  Chest: Clear to ascultation bilaterally.No crackles or wheezes. Non tender to palpation  Breast: No asymetry,no masses or lumps. No tenderness. No nipple discharge or inversion. No axillary or supraclavicular adenopathy  Cardiovascular system; Heart sounds normal,  S1 and  S2 ,no S3.  No murmur, or thrill. Apical beat not displaced Peripheral pulses normal.  Abdomen: Soft, non tender, no organomegaly or masses. No bruits. Bowel sounds normal. No guarding, tenderness or rebound.  Rectal:  Normal sphincter tone. No rectal mass. Guaiac negative stool.  GU: External genitalia normal female genitalia , normal female distribution of hair. No lesions. Urethral meatus normal in size, no  Prolapse, no lesions visibly  Present. Bladder non tender. Vagina pink and moist , with no visible lesions , discharge present . Adequate pelvic support no  cystocele or rectocele noted Cervix pink and appears healthy, no lesions or ulcerations noted, easy bleeding from os , and cervix looks hyperemic  Uterus enlarged, no adnexal masses, no cervical motion or adnexal tenderness.   Musculoskeletal exam: Full ROM of spine, hips , shoulders and knees. No deformity ,swelling or crepitus noted. No muscle  wasting or atrophy.   Neurologic: Cranial nerves 2 to 12 intact. Power, tone ,sensation and reflexes normal throughout. No disturbance in gait. No tremor.  Skin: Intact, axillary hydradenitis, no current flare Pigmentation normal throughout  Psych; Normal mood and affect. Judgement and concentration normal   Assessment & Plan:  Annual physical exam Annual exam as documented. Counseling done  re healthy lifestyle involving commitment to 150 minutes exercise per week, heart healthy diet, and attaining healthy weight.The importance of adequate sleep also discussed. Regular seat belt use and home safety, is also discussed. Changes in health habits are decided on by the patient with goals and time frames  set for achieving them. Immunization and cancer screening needs are specifically addressed at this visit.   Need for 23-polyvalent pneumococcal polysaccharide vaccine After obtaining informed consent, the vaccine is  administered by LPN.   Obesity, Class III, BMI 40-49.9 (morbid obesity) Deteriorated. Patient re-educated about  the importance of commitment to a  minimum of 150 minutes of exercise per week.  The importance of healthy food choices with portion control discussed. Encouraged to start a food diary, count calories and to consider  joining a support group. Sample diet sheets offered. Goals set by the patient for the next several months.   Weight /BMI 01/19/2016 09/18/2015 05/03/2015  WEIGHT 276 lb 270 lb 267 lb 1.9 oz  HEIGHT 5\' 9"  5\' 9"  5\' 9"   BMI 40.76 kg/m2 39.87 kg/m2 39.43 kg/m2

## 2016-01-19 NOTE — Assessment & Plan Note (Signed)

## 2016-01-19 NOTE — Patient Instructions (Addendum)
Wellness visit March 17 or after, call if you need me sooner  Pneumonia 23 vaccine today  Pls change eating habits, 80 % vegetable and fruit, and commit to 30 mins exercise daily  Fasting labs for march visit  You are referred for [place] of womb and also for eye exam  Thank you  for choosing [facility]. We consider it a privelige to serve you.  Delivering excellent health care in a caring and  compassionate way is our goal.  Partnering with you,  so that together we can achieve this goal is our strategy.

## 2016-01-19 NOTE — Assessment & Plan Note (Signed)
After obtaining informed consent, the vaccine is  administered by LPN.  

## 2016-01-19 NOTE — Assessment & Plan Note (Signed)
Deteriorated. Patient re-educated about  the importance of commitment to a  minimum of 150 minutes of exercise per week.  The importance of healthy food choices with portion control discussed. Encouraged to start a food diary, count calories and to consider  joining a support group. Sample diet sheets offered. Goals set by the patient for the next several months.   Weight /BMI 01/19/2016 09/18/2015 05/03/2015  WEIGHT 276 lb 270 lb 267 lb 1.9 oz  HEIGHT 5\' 9"  5\' 9"  5\' 9"   BMI 40.76 kg/m2 39.87 kg/m2 39.43 kg/m2

## 2016-01-21 LAB — VITAMIN D 1,25 DIHYDROXY
VITAMIN D 1, 25 (OH) TOTAL: 43 pg/mL (ref 18–72)
VITAMIN D2 1, 25 (OH): 33 pg/mL
VITAMIN D3 1, 25 (OH): 10 pg/mL

## 2016-01-22 LAB — CYTOLOGY - PAP: Diagnosis: NEGATIVE

## 2016-01-24 ENCOUNTER — Ambulatory Visit (HOSPITAL_COMMUNITY)
Admission: RE | Admit: 2016-01-24 | Discharge: 2016-01-24 | Disposition: A | Discharge status: Home / Self Care | Payer: PPO | Source: Ambulatory Visit | Attending: Family Medicine | Admitting: Family Medicine

## 2016-01-24 DIAGNOSIS — R938 Abnormal findings on diagnostic imaging of other specified body structures: Secondary | ICD-10-CM | POA: Diagnosis not present

## 2016-01-24 DIAGNOSIS — N939 Abnormal uterine and vaginal bleeding, unspecified: Secondary | ICD-10-CM | POA: Diagnosis not present

## 2016-01-24 DIAGNOSIS — N840 Polyp of corpus uteri: Secondary | ICD-10-CM | POA: Diagnosis present

## 2016-01-24 DIAGNOSIS — D259 Leiomyoma of uterus, unspecified: Secondary | ICD-10-CM | POA: Insufficient documentation

## 2016-01-29 LAB — HM DIABETES EYE EXAM

## 2016-02-01 ENCOUNTER — Other Ambulatory Visit: Payer: Self-pay | Admitting: Family Medicine

## 2016-02-01 ENCOUNTER — Telehealth: Payer: Self-pay | Admitting: Family Medicine

## 2016-02-01 DIAGNOSIS — R9389 Abnormal findings on diagnostic imaging of other specified body structures: Secondary | ICD-10-CM

## 2016-02-01 NOTE — Telephone Encounter (Signed)
Spoke with pt she agrees with referral and this will be entered

## 2016-02-12 NOTE — Telephone Encounter (Signed)
Referred to gyne for abn pelvic US and vaginal bleeding on exam

## 2016-02-20 DIAGNOSIS — H2513 Age-related nuclear cataract, bilateral: Secondary | ICD-10-CM | POA: Diagnosis not present

## 2016-02-20 DIAGNOSIS — E119 Type 2 diabetes mellitus without complications: Secondary | ICD-10-CM | POA: Diagnosis not present

## 2016-02-20 DIAGNOSIS — H5213 Myopia, bilateral: Secondary | ICD-10-CM | POA: Diagnosis not present

## 2016-02-20 DIAGNOSIS — H52203 Unspecified astigmatism, bilateral: Secondary | ICD-10-CM | POA: Diagnosis not present

## 2016-02-20 DIAGNOSIS — H04123 Dry eye syndrome of bilateral lacrimal glands: Secondary | ICD-10-CM | POA: Diagnosis not present

## 2016-02-20 DIAGNOSIS — H524 Presbyopia: Secondary | ICD-10-CM | POA: Diagnosis not present

## 2016-02-20 LAB — HM DIABETES EYE EXAM

## 2016-02-22 DIAGNOSIS — Z6841 Body Mass Index (BMI) 40.0 and over, adult: Secondary | ICD-10-CM | POA: Diagnosis not present

## 2016-02-22 DIAGNOSIS — N952 Postmenopausal atrophic vaginitis: Secondary | ICD-10-CM | POA: Diagnosis not present

## 2016-02-22 DIAGNOSIS — D0581 Other specified type of carcinoma in situ of right breast: Secondary | ICD-10-CM | POA: Diagnosis not present

## 2016-02-22 DIAGNOSIS — N8500 Endometrial hyperplasia, unspecified: Secondary | ICD-10-CM | POA: Diagnosis not present

## 2016-02-22 DIAGNOSIS — N950 Postmenopausal bleeding: Secondary | ICD-10-CM | POA: Diagnosis not present

## 2016-03-11 ENCOUNTER — Other Ambulatory Visit: Payer: Self-pay | Admitting: Family Medicine

## 2016-03-11 DIAGNOSIS — Z853 Personal history of malignant neoplasm of breast: Secondary | ICD-10-CM

## 2016-03-17 ENCOUNTER — Other Ambulatory Visit: Payer: Self-pay | Admitting: Family Medicine

## 2016-03-17 DIAGNOSIS — E785 Hyperlipidemia, unspecified: Secondary | ICD-10-CM

## 2016-04-07 ENCOUNTER — Other Ambulatory Visit: Payer: Self-pay | Admitting: Family Medicine

## 2016-04-15 ENCOUNTER — Ambulatory Visit
Admission: RE | Admit: 2016-04-15 | Discharge: 2016-04-15 | Disposition: A | Discharge status: Home / Self Care | Payer: PPO | Source: Ambulatory Visit | Attending: Family Medicine | Admitting: Family Medicine

## 2016-04-15 ENCOUNTER — Other Ambulatory Visit: Payer: Self-pay | Admitting: Family Medicine

## 2016-04-15 DIAGNOSIS — R921 Mammographic calcification found on diagnostic imaging of breast: Secondary | ICD-10-CM | POA: Diagnosis not present

## 2016-04-15 DIAGNOSIS — Z853 Personal history of malignant neoplasm of breast: Secondary | ICD-10-CM

## 2016-04-15 DIAGNOSIS — N6011 Diffuse cystic mastopathy of right breast: Secondary | ICD-10-CM | POA: Diagnosis not present

## 2016-04-16 HISTORY — PX: BREAST BIOPSY: SHX20

## 2016-04-28 ENCOUNTER — Other Ambulatory Visit: Payer: Self-pay | Admitting: Family Medicine

## 2016-04-30 ENCOUNTER — Other Ambulatory Visit: Payer: Self-pay | Admitting: Obstetrics and Gynecology

## 2016-05-06 DIAGNOSIS — L732 Hidradenitis suppurativa: Secondary | ICD-10-CM | POA: Diagnosis not present

## 2016-05-06 DIAGNOSIS — Z124 Encounter for screening for malignant neoplasm of cervix: Secondary | ICD-10-CM | POA: Diagnosis not present

## 2016-05-06 DIAGNOSIS — D259 Leiomyoma of uterus, unspecified: Secondary | ICD-10-CM | POA: Diagnosis not present

## 2016-05-06 DIAGNOSIS — N898 Other specified noninflammatory disorders of vagina: Secondary | ICD-10-CM | POA: Diagnosis not present

## 2016-05-06 DIAGNOSIS — N8500 Endometrial hyperplasia, unspecified: Secondary | ICD-10-CM | POA: Diagnosis not present

## 2016-05-08 ENCOUNTER — Encounter (HOSPITAL_BASED_OUTPATIENT_CLINIC_OR_DEPARTMENT_OTHER): Payer: Self-pay | Admitting: *Deleted

## 2016-05-08 NOTE — Progress Notes (Signed)
NPO AFTER MN.  ARRIVE AT 0600.  NEEDS ISTAT AND EKG.  

## 2016-05-13 ENCOUNTER — Encounter (HOSPITAL_BASED_OUTPATIENT_CLINIC_OR_DEPARTMENT_OTHER): Payer: Self-pay | Admitting: Obstetrics and Gynecology

## 2016-05-13 NOTE — H&P (Signed)
Theresa Adkins is an 71 y.o. female. She presents for further evaluation of endometrial thickening noted on pelvic ultrasound. She had a single episode of postmenopausal bleeding. Initially thought to be due to vaginal atrophy  Pertinent Gynecological History: Menses: post-menopausal Bleeding: post menopausal bleeding Contraception: Menopause DES exposure: unknown Blood transfusions: none Sexually transmitted diseases: no past history Previous GYN Procedures: None  Last mammogram: Abnormal. Patient is status post biopsy of the right breast with the finding of calcifications Date: February 2018 Last pap: normal Date: December 2017 high-risk HPV negative. February 2018 OB History: G 1, P 1   Menstrual History: Menarche age: 41 No LMP recorded. Patient is postmenopausal.    Past Medical History:  Diagnosis Date  . Allergic rhinitis, seasonal   . Arthritis   . Breast cancer [facility]) dx 02/ 2015---  oncologist-  dr shadad/ dr Lisbeth Renshaw   dx Right breast upper-outer quadrant DCIS, high grade (Tis N0), ER+, PR negative--- 07-01-2013  s/p  right breast lumpectomy w/ sln bxs and radiation therapy (08-24-2013 to 09-20-2013)  . Diabetes mellitus type II   . Diabetic neuropathy ([place])   . Heart murmur   . Hidradenitis axillaris    chronic  . History of adenomatous polyp of colon    03-04-2008  tubular adenoma  . History of radiation therapy 08-24-2013 to 09-20-2013   total 50Gy  . Hyperlipidemia   . Hypertension   . Thickened endometrium   . Wears dentures    upper  . Wears glasses     Past Surgical History:  Procedure Laterality Date  . BREAST LUMPECTOMY WITH NEEDLE LOCALIZATION AND AXILLARY SENTINEL LYMPH NODE BX Right 07/01/2013   Procedure: BREAST LUMPECTOMY WITH NEEDLE LOCALIZATION AND AXILLARY SENTINEL LYMPH NODE BX;  Surgeon: Merrie Roof, MD;  Location: [place];  Service: General;  Laterality: Right;  . BREAST SURGERY  1970s   benign tumor removed from unspecified breast   .  COLONOSCOPY W/ POLYPECTOMY  03/04/2008    Family History  Problem Relation Age of Onset  . Cancer Brother 65    bone   . Obesity Sister     both  . Heart disease Mother   . Cancer Sister 75    leukemia     Social History:  reports that she quit smoking about 14 years ago. Her smoking use included Cigarettes. She has a 25.00 pack-year smoking history. She has never used smokeless tobacco. She reports that she does not drink alcohol or use drugs.  Allergies:  Allergies  Allergen Reactions  . Ace Inhibitors Other (See Comments)    Cough  . Aspirin Nausea Only    Prescriptions Prior to Admission  Medication Sig Dispense Refill Last Dose  . amLODipine (NORVASC) 5 MG tablet TAKE ONE TABLET BY MOUTH ONCE DAILY (Patient taking differently: TAKE ONE TABLET BY MOUTH ONCE DAILY--- takes in pm) 90 tablet 1 05/13/2016 at Unknown time  . atorvastatin (LIPITOR) 10 MG tablet TAKE ONE TABLET BY MOUTH ONCE DAILY (Patient taking differently: TAKE ONE TABLET BY MOUTH ONCE DAILY---  takes in pm) 90 tablet 1 05/13/2016 at Unknown time  . bisoprolol-hydrochlorothiazide (ZIAC) 10-6.25 MG tablet TAKE ONE TABLET BY MOUTH ONCE DAILY (Patient taking differently: TAKE ONE TABLET BY MOUTH ONCE DAILY--  takes in pm) 90 tablet 1 05/13/2016 at Unknown time  . metFORMIN (GLUCOPHAGE) 1000 MG tablet TAKE ONE TABLET BY MOUTH ONCE DAILY WITH A MEAL (Patient taking differently: TAKE ONE TABLET BY MOUTH ONCE DAILY WITH A MEAL--  takes in pm) 90 tablet 1 05/13/2016 at Unknown time  . Vitamin D, Ergocalciferol, (DRISDOL) 50000 units CAPS capsule Take 1 capsule (50,000 Units total) by mouth every 7 (seven) days. 12 capsule 1 Past Week at Unknown time    Review of Systems  Constitutional: Negative.   HENT: Negative.   Eyes: Negative.   Respiratory: Negative.   Cardiovascular: Negative.   Gastrointestinal: Negative.   Genitourinary: Negative.   Skin:       Hidradenitis  Neurological: Negative.   Psychiatric/Behavioral:  Negative.     Blood pressure (!) 163/74, pulse 81, temperature 98.7 F (37.1 C), temperature source Oral, resp. rate 16, height 5\' 9"  (1.753 m), weight 280 lb (127 kg), SpO2 98 %. Physical Exam  Constitutional: She is oriented to person, place, and time. She appears well-developed and well-nourished.  Morbidly obese  HENT:  Head: Normocephalic and atraumatic.  Eyes: EOM are normal.  Neck: Normal range of motion. Neck supple.  Cardiovascular: Normal rate and regular rhythm.   Respiratory: Breath sounds normal.  GI: Soft. Bowel sounds are normal.  Exam limited by body habitus  Genitourinary:  Genitourinary Comments: Pelvic exam:  VULVA: normal appearing vulva with no masses, tenderness or lesions,  VAGINA: atrophic,  CERVIX: cervical stenosis present,  UTERUS: Does not feel enlarged, but examination is limited by body habitus,  ADNEXA: normal adnexa in size, nontender and no masses, no masses felt. Examination is limited by body habitus.  Musculoskeletal: Normal range of motion.  Neurological: She is alert and oriented to person, place, and time.  Skin: Skin is warm and dry.  Psychiatric: She has a normal mood and affect.   Ultrasound: 01/2016=Thickened heterogeneous endometrium (>9MM) with fluid in the endometrial and cervical canal Results for orders placed or performed during the hospital encounter of 05/14/16 (from the past 24 hour(s))  I-STAT 4, (NA,K, GLUC, HGB,HCT)     Status: Abnormal   Collection Time: 05/14/16  7:04 AM  Result Value Ref Range   Sodium 140 135 - 145 mmol/L   Potassium 4.1 3.5 - 5.1 mmol/L   Glucose, Bld 135 (H) 65 - 99 mg/dL   HCT 35.0 (L) 36.0 - 46.0 %   Hemoglobin 11.9 (L) 12.0 - 15.0 g/dL     Assessment History of postmenopausal bleeding thought to be due to vaginal atrophy Ultrasound showing thickened endometrium with fluid in the endometrial and cervical canal Cannot rule out endometrial carcinoma Patient with carcinoma in situ  breast  Recommendation: Hysteroscopy with D&C and resection of any lesions that is noted is recommended.  The risks of anesthesia, bleeding, infection, damage to adjacent organs, and the particular risk of uterine perforation were all reviewed. The patient wishes to proceed as recommended.    Providence Stivers P 05/14/2016, 7:29 AM

## 2016-05-14 ENCOUNTER — Ambulatory Visit (HOSPITAL_BASED_OUTPATIENT_CLINIC_OR_DEPARTMENT_OTHER)
Admission: RE | Admit: 2016-05-14 | Discharge: 2016-05-14 | Disposition: A | Discharge status: Home / Self Care | Payer: PPO | Source: Ambulatory Visit | Attending: Obstetrics and Gynecology | Admitting: Obstetrics and Gynecology

## 2016-05-14 ENCOUNTER — Encounter (HOSPITAL_BASED_OUTPATIENT_CLINIC_OR_DEPARTMENT_OTHER)
Admission: RE | Disposition: A | Discharge status: Home / Self Care | Payer: Self-pay | Source: Ambulatory Visit | Attending: Obstetrics and Gynecology

## 2016-05-14 ENCOUNTER — Ambulatory Visit (HOSPITAL_BASED_OUTPATIENT_CLINIC_OR_DEPARTMENT_OTHER): Payer: PPO | Admitting: Anesthesiology

## 2016-05-14 ENCOUNTER — Encounter (HOSPITAL_BASED_OUTPATIENT_CLINIC_OR_DEPARTMENT_OTHER): Payer: Self-pay | Admitting: *Deleted

## 2016-05-14 DIAGNOSIS — Z6841 Body Mass Index (BMI) 40.0 and over, adult: Secondary | ICD-10-CM | POA: Diagnosis not present

## 2016-05-14 DIAGNOSIS — E785 Hyperlipidemia, unspecified: Secondary | ICD-10-CM | POA: Insufficient documentation

## 2016-05-14 DIAGNOSIS — N840 Polyp of corpus uteri: Secondary | ICD-10-CM | POA: Diagnosis not present

## 2016-05-14 DIAGNOSIS — R938 Abnormal findings on diagnostic imaging of other specified body structures: Secondary | ICD-10-CM | POA: Diagnosis not present

## 2016-05-14 DIAGNOSIS — Z7984 Long term (current) use of oral hypoglycemic drugs: Secondary | ICD-10-CM | POA: Insufficient documentation

## 2016-05-14 DIAGNOSIS — I10 Essential (primary) hypertension: Secondary | ICD-10-CM | POA: Insufficient documentation

## 2016-05-14 DIAGNOSIS — D250 Submucous leiomyoma of uterus: Secondary | ICD-10-CM

## 2016-05-14 DIAGNOSIS — N950 Postmenopausal bleeding: Secondary | ICD-10-CM | POA: Diagnosis not present

## 2016-05-14 DIAGNOSIS — Z87891 Personal history of nicotine dependence: Secondary | ICD-10-CM | POA: Insufficient documentation

## 2016-05-14 DIAGNOSIS — R935 Abnormal findings on diagnostic imaging of other abdominal regions, including retroperitoneum: Secondary | ICD-10-CM | POA: Diagnosis present

## 2016-05-14 DIAGNOSIS — E1140 Type 2 diabetes mellitus with diabetic neuropathy, unspecified: Secondary | ICD-10-CM | POA: Insufficient documentation

## 2016-05-14 DIAGNOSIS — Z853 Personal history of malignant neoplasm of breast: Secondary | ICD-10-CM | POA: Insufficient documentation

## 2016-05-14 DIAGNOSIS — D0581 Other specified type of carcinoma in situ of right breast: Secondary | ICD-10-CM | POA: Diagnosis not present

## 2016-05-14 DIAGNOSIS — N8500 Endometrial hyperplasia, unspecified: Secondary | ICD-10-CM | POA: Diagnosis not present

## 2016-05-14 HISTORY — DX: Personal history of adenomatous and serrated colon polyps: Z86.0101

## 2016-05-14 HISTORY — DX: Personal history of irradiation: Z92.3

## 2016-05-14 HISTORY — DX: Type 2 diabetes mellitus with diabetic neuropathy, unspecified: E11.40

## 2016-05-14 HISTORY — DX: Presence of spectacles and contact lenses: Z97.3

## 2016-05-14 HISTORY — DX: Unspecified osteoarthritis, unspecified site: M19.90

## 2016-05-14 HISTORY — DX: Presence of dental prosthetic device (complete) (partial): Z97.2

## 2016-05-14 HISTORY — DX: Abnormal findings on diagnostic imaging of other specified body structures: R93.89

## 2016-05-14 HISTORY — DX: Postmenopausal bleeding: N95.0

## 2016-05-14 HISTORY — DX: Personal history of colonic polyps: Z86.010

## 2016-05-14 HISTORY — PX: DILATATION & CURRETTAGE/HYSTEROSCOPY WITH RESECTOCOPE: SHX5572

## 2016-05-14 LAB — POCT I-STAT 4, (NA,K, GLUC, HGB,HCT)
GLUCOSE: 135 mg/dL — AB (ref 65–99)
HCT: 35 % — ABNORMAL LOW (ref 36.0–46.0)
HEMOGLOBIN: 11.9 g/dL — AB (ref 12.0–15.0)
POTASSIUM: 4.1 mmol/L (ref 3.5–5.1)
Sodium: 140 mmol/L (ref 135–145)

## 2016-05-14 LAB — GLUCOSE, CAPILLARY: Glucose-Capillary: 146 mg/dL — ABNORMAL HIGH (ref 65–99)

## 2016-05-14 SURGERY — DILATATION & CURETTAGE/HYSTEROSCOPY WITH RESECTOCOPE
Anesthesia: General | Site: Uterus

## 2016-05-14 MED ORDER — PROPOFOL 10 MG/ML IV BOLUS
INTRAVENOUS | Status: DC | PRN
  Administered 2016-05-14: 200 mg via INTRAVENOUS
  Administered 2016-05-14 (×2): 50 mg via INTRAVENOUS

## 2016-05-14 MED ORDER — SUGAMMADEX SODIUM 200 MG/2ML IV SOLN
INTRAVENOUS | Status: AC
Start: 2016-05-14 — End: 2016-05-14
  Filled 2016-05-14: qty 2

## 2016-05-14 MED ORDER — ROCURONIUM BROMIDE 50 MG/5ML IV SOSY
PREFILLED_SYRINGE | INTRAVENOUS | Status: AC
  Filled 2016-05-14: qty 5

## 2016-05-14 MED ORDER — SUCCINYLCHOLINE CHLORIDE 200 MG/10ML IV SOSY
PREFILLED_SYRINGE | INTRAVENOUS | Status: AC
  Filled 2016-05-14: qty 10

## 2016-05-14 MED ORDER — SODIUM CHLORIDE 0.9 % IR SOLN
Status: DC | PRN
  Administered 2016-05-14: 3000 mL

## 2016-05-14 MED ORDER — FENTANYL CITRATE (PF) 100 MCG/2ML IJ SOLN
25.0000 ug | INTRAMUSCULAR | Status: DC | PRN
  Filled 2016-05-14: qty 1

## 2016-05-14 MED ORDER — SUGAMMADEX SODIUM 200 MG/2ML IV SOLN
INTRAVENOUS | Status: AC
  Filled 2016-05-14: qty 2

## 2016-05-14 MED ORDER — DEXAMETHASONE SODIUM PHOSPHATE 4 MG/ML IJ SOLN
INTRAMUSCULAR | Status: DC | PRN
Start: 2016-05-14 — End: 2016-05-14
  Administered 2016-05-14: 10 mg via INTRAVENOUS

## 2016-05-14 MED ORDER — IBUPROFEN 600 MG PO TABS: ORAL_TABLET | ORAL | 0 refills | Status: DC

## 2016-05-14 MED ORDER — KETOROLAC TROMETHAMINE 30 MG/ML IJ SOLN
INTRAMUSCULAR | Status: DC | PRN
  Administered 2016-05-14: 15 mg via INTRAMUSCULAR
  Administered 2016-05-14: 15 mg via INTRAVENOUS

## 2016-05-14 MED ORDER — ONDANSETRON HCL 4 MG/2ML IJ SOLN
INTRAMUSCULAR | Status: DC | PRN
  Administered 2016-05-14: 4 mg via INTRAVENOUS

## 2016-05-14 MED ORDER — ROCURONIUM BROMIDE 50 MG/5ML IV SOSY
PREFILLED_SYRINGE | INTRAVENOUS | Status: DC | PRN
  Administered 2016-05-14: 25 mg via INTRAVENOUS

## 2016-05-14 MED ORDER — LIDOCAINE HCL 2 % IJ SOLN
INTRAMUSCULAR | Status: DC | PRN
  Administered 2016-05-14: 10 mL

## 2016-05-14 MED ORDER — FENTANYL CITRATE (PF) 100 MCG/2ML IJ SOLN
INTRAMUSCULAR | Status: AC
  Filled 2016-05-14: qty 2

## 2016-05-14 MED ORDER — EPHEDRINE 5 MG/ML INJ
INTRAVENOUS | Status: AC
  Filled 2016-05-14: qty 10

## 2016-05-14 MED ORDER — PROMETHAZINE HCL 25 MG/ML IJ SOLN
6.2500 mg | INTRAMUSCULAR | Status: DC | PRN
  Filled 2016-05-14: qty 1

## 2016-05-14 MED ORDER — SUCCINYLCHOLINE CHLORIDE 200 MG/10ML IV SOSY
PREFILLED_SYRINGE | INTRAVENOUS | Status: DC | PRN
  Administered 2016-05-14: 160 mg via INTRAVENOUS

## 2016-05-14 MED ORDER — OXYCODONE HCL 5 MG/5ML PO SOLN
5.0000 mg | Freq: Once | ORAL | Status: DC | PRN
  Filled 2016-05-14: qty 5

## 2016-05-14 MED ORDER — FENTANYL CITRATE (PF) 100 MCG/2ML IJ SOLN
INTRAMUSCULAR | Status: DC | PRN
  Administered 2016-05-14: 50 ug via INTRAVENOUS

## 2016-05-14 MED ORDER — LIDOCAINE 2% (20 MG/ML) 5 ML SYRINGE
INTRAMUSCULAR | Status: DC | PRN
  Administered 2016-05-14: 20 mg via INTRAVENOUS
  Administered 2016-05-14: 80 mg via INTRAVENOUS

## 2016-05-14 MED ORDER — PROPOFOL 10 MG/ML IV BOLUS
INTRAVENOUS | Status: AC
Start: 2016-05-14 — End: 2016-05-14
  Filled 2016-05-14: qty 40

## 2016-05-14 MED ORDER — LACTATED RINGERS IV SOLN
INTRAVENOUS | Status: DC
  Administered 2016-05-14 (×2): via INTRAVENOUS
  Filled 2016-05-14: qty 1000

## 2016-05-14 MED ORDER — OXYCODONE HCL 5 MG PO TABS
5.0000 mg | ORAL_TABLET | Freq: Once | ORAL | Status: DC | PRN
  Filled 2016-05-14: qty 1

## 2016-05-14 MED ORDER — EPHEDRINE SULFATE 50 MG/ML IJ SOLN
INTRAMUSCULAR | Status: DC | PRN
  Administered 2016-05-14: 15 mg via INTRAVENOUS

## 2016-05-14 MED ORDER — KETOROLAC TROMETHAMINE 30 MG/ML IJ SOLN
INTRAMUSCULAR | Status: AC
  Filled 2016-05-14: qty 1

## 2016-05-14 MED ORDER — SUGAMMADEX SODIUM 200 MG/2ML IV SOLN
INTRAVENOUS | Status: DC | PRN
  Administered 2016-05-14: 250 mg via INTRAVENOUS

## 2016-05-14 MED ORDER — MIDAZOLAM HCL 2 MG/2ML IJ SOLN
INTRAMUSCULAR | Status: AC
  Filled 2016-05-14: qty 2

## 2016-05-14 MED ORDER — MIDAZOLAM HCL 5 MG/5ML IJ SOLN
INTRAMUSCULAR | Status: DC | PRN
  Administered 2016-05-14 (×2): 1 mg via INTRAVENOUS

## 2016-05-14 SURGICAL SUPPLY — 27 items
BIPOLAR CUTTING LOOP 21FR (ELECTRODE) ×2
BOOTIES KNEE HIGH SLOAN (MISCELLANEOUS) ×3 IMPLANT
CANISTER SUCT 3000ML (MISCELLANEOUS) ×3 IMPLANT
CATH ROBINSON RED A/P 16FR (CATHETERS) ×3 IMPLANT
COVER BACK TABLE 60X90IN (DRAPES) ×3 IMPLANT
CURETTE PIPELLE ENDOMTRL SUCTN (MISCELLANEOUS) IMPLANT
DILATOR CANAL MILEX (MISCELLANEOUS) IMPLANT
DRAPE HYSTEROSCOPY (DRAPE) ×3 IMPLANT
DRAPE LG THREE QUARTER DISP (DRAPES) ×3 IMPLANT
GLOVE SURG SS PI 6.5 STRL IVOR (GLOVE) ×3 IMPLANT
GOWN STRL REUS W/TWL LRG LVL3 (GOWN DISPOSABLE) ×6 IMPLANT
KIT RM TURNOVER CYSTO AR (KITS) ×3 IMPLANT
LEGGING LITHOTOMY PAIR STRL (DRAPES) ×3 IMPLANT
LOOP CUTTING BIPOLAR 21FR (ELECTRODE) IMPLANT
NDL SPNL 22GX3.5 QUINCKE BK (NEEDLE) ×1 IMPLANT
NEEDLE SPNL 22GX3.5 QUINCKE BK (NEEDLE) ×3 IMPLANT
PACK BASIN DAY SURGERY FS (CUSTOM PROCEDURE TRAY) ×3 IMPLANT
PAD OB MATERNITY 4.3X12.25 (PERSONAL CARE ITEMS) ×3 IMPLANT
PIPELLE ENDOMETRIAL SUCTION CU (MISCELLANEOUS) ×3
SPONGE SURGIFOAM ABS GEL 12-7 (HEMOSTASIS) ×2 IMPLANT
SUT VIC AB 2-0 UR6 27 (SUTURE) ×2 IMPLANT
SYR CONTROL 10ML LL (SYRINGE) ×3 IMPLANT
TOWEL OR 17X24 6PK STRL BLUE (TOWEL DISPOSABLE) ×6 IMPLANT
TRAY DSU PREP LF (CUSTOM PROCEDURE TRAY) ×3 IMPLANT
TUBING AQUILEX INFLOW (TUBING) ×3 IMPLANT
TUBING AQUILEX OUTFLOW (TUBING) ×3 IMPLANT
WATER STERILE IRR 500ML POUR (IV SOLUTION) ×3 IMPLANT

## 2016-05-14 NOTE — Discharge Instructions (Signed)
° °  D & C Home care Instructions: ° ° °Personal hygiene:  Used sanitary napkins for vaginal drainage not tampons. Shower or tub bathe the day after your procedure. No douching until bleeding stops. Always wipe from front to back after  Elimination. ° °Activity: Do not drive or operate any equipment today. The effects of the anesthesia are still present and drowsiness may result. Rest today, not necessarily flat bed rest, just take it easy. You may resume your normal activity in one to 2 days. ° °Sexual activity: No intercourse for one week or as indicated by your physician ° °Diet: Eat a light diet as desired this evening. You may resume a regular diet tomorrow. ° °Return to work: One to 2 days. ° °General Expectations of your surgery: Vaginal bleeding should be no heavier than a normal period. Spotting may continue up to 10 days. Mild cramps may continue for a couple of days. You may have a regular period in 2-6 weeks. ° °Unexpected observations call your doctor if these occur: persistent or heavy bleeding. Severe abdominal cramping or pain. Elevation of temperature greater than 100°F. ° °Call for an appointment in one week. ° ° °Post Anesthesia Home Care Instructions ° °Activity: °Get plenty of rest for the remainder of the day. A responsible individual must stay with you for 24 hours following the procedure.  °For the next 24 hours, DO NOT: °-Drive a car °-Operate machinery °-Drink alcoholic beverages °-Take any medication unless instructed by your physician °-Make any legal decisions or sign important papers. ° °Meals: °Start with liquid foods such as gelatin or soup. Progress to regular foods as tolerated. Avoid greasy, spicy, heavy foods. If nausea and/or vomiting occur, drink only clear liquids until the nausea and/or vomiting subsides. Call your physician if vomiting continues. ° °Special Instructions/Symptoms: °Your throat may feel dry or sore from the anesthesia or the breathing tube placed in your throat  during surgery. If this causes discomfort, gargle with warm salt water. The discomfort should disappear within 24 hours. ° °If you had a scopolamine patch placed behind your ear for the management of post- operative nausea and/or vomiting: ° °1. The medication in the patch is effective for 72 hours, after which it should be removed.  Wrap patch in a tissue and discard in the trash. Wash hands thoroughly with soap and water. °2. You may remove the patch earlier than 72 hours if you experience unpleasant side effects which may include dry mouth, dizziness or visual disturbances. °3. Avoid touching the patch. Wash your hands with soap and water after contact with the patch. °  ° °

## 2016-05-14 NOTE — Transfer of Care (Addendum)
Last Vitals:  Vitals:   05/14/16 0920 05/14/16 0930  BP:  130/60  Pulse: 83 78  Resp: 17 12  Temp:      Last Pain:  Vitals:   05/14/16 0857  TempSrc:   PainSc: Asleep      Patients Stated Pain Goal: 8 (05/14/16 9371)  Immediate Anesthesia Transfer of Care Note  Patient: Theresa Adkins  Procedure(s) Performed: Procedure(s) (LRB): DILATATION & CURETTAGE/HYSTEROSCOPY WITH RESECTOCOPE (N/A)  Patient Location: PACU  Anesthesia Type: General  Level of Consciousness: awake, alert  and oriented  Airway & Oxygen Therapy: Patient Spontanous Breathing and Patient connected to face mask oxygen  Post-op Assessment: Report given to PACU RN and Post -op Vital signs reviewed and stable  Post vital signs: Reviewed and stable  Complications: No apparent anesthesia complications

## 2016-05-14 NOTE — Anesthesia Postprocedure Evaluation (Signed)
Anesthesia Post Note  Patient: Theresa Adkins  Procedure(s) Performed: Procedure(s) (LRB): DILATATION & CURETTAGE/HYSTEROSCOPY WITH RESECTOCOPE (N/A)  Patient location during evaluation: PACU Anesthesia Type: General Level of consciousness: awake and alert Pain management: pain level controlled Vital Signs Assessment: post-procedure vital signs reviewed and stable Respiratory status: spontaneous breathing, nonlabored ventilation, respiratory function stable and patient connected to nasal cannula oxygen Cardiovascular status: blood pressure returned to baseline and stable Postop Assessment: no signs of nausea or vomiting Anesthetic complications: no       Last Vitals:  Vitals:   05/14/16 0857 05/14/16 0900  BP: (!) 145/74 (!) 148/75  Pulse: 90 85  Resp: 14 (!) 22  Temp: 36.8 C     Last Pain:  Vitals:   05/14/16 0857  TempSrc:   PainSc: Asleep                 Sandro Burgo S

## 2016-05-14 NOTE — Op Note (Signed)
05/14/2016  8:50 AM  PATIENT:  Theresa Adkins  71 y.o. female  PRE-OPERATIVE DIAGNOSIS:  Endometrial Hyperplasia, endometrial polyp, breast cancer  POST-OPERATIVE DIAGNOSIS:  Endometrial Hyperplasia, endometrial polyp, breast cancer  PROCEDURE:  Procedure(s): DILATATION & CURETTAGE/HYSTEROSCOPY WITH RESECTOCOPE  SURGEON:  Marthe Dant P, MD  ASSISTANTS: None  ANESTHESIA:   general  ESTIMATED BLOOD LOSS: 50 cc   BLOOD ADMINISTERED:none  COMPLICATIONS: None  FINDINGS: The uterus sounded to 9 cm. At hysteroscopy. Both tubal ostia could be identified. There was a 10 mm endometrial polyp emanating from the anterior endometrium. The remainder of the cavity was without intracavitary lesions. However, left side of the anterior and lateral walls were irregular.  FLUID DEFICIT: 350 cc  LOCAL MEDICATIONS USED:  LIDOCAINE  and Amount: 10 ml  SPECIMEN:  Source of Specimen:  Endometrial polyp and endometrial curettings  DISPOSITION OF SPECIMEN:  PATHOLOGY  COUNTS:  YES  DESCRIPTION OF PROCEDURE:the patient was taken to the operating room after appropriate identification and placed on the operating table. After the attainment of adequate general anesthesia she was placed in the lithotomy position. The perineum and vagina were prepped with multiple layers of Betadine. The bladder was emptied with a an in and out catheter. The perineum was draped in sterile field. A gray speculum was placed in the vagina. The cervix was grasped with a single-tooth tenaculum. A paracervical block was achieved with a total of 10 cc of 2% Xylocaine and the 5 and 7:00 positions. The uterus was sounded.  The cervix was then dilated to accommodate the diagnostic hysteroscope. The hysteroscope was used to evaluate all quadrants of the uterus. A Randall stone forceps was used to remove the polyp in total. Uterine cavity was then curetted and the resectoscope all of the endometrial cavity. All instruments were then  removed from the vagina and the patient was awakened from general anesthesia and taken to the recovery room in satisfactory condition having tolerated the procedure well sponge and instrument counts correct.  PLAN OF CARE: Discharge home after postanesthesia care  PATIENT DISPOSITION:  PACU - hemodynamically stable.   Delay start of Pharmacological VTE agent (>24hrs) due to surgical blood loss or risk of bleeding:  Yes. SCD hose were used during the procedure    Eldred Manges, MD 8:50 AM

## 2016-05-14 NOTE — Anesthesia Procedure Notes (Signed)
Procedure Name: LMA Insertion Date/Time: 05/14/2016 7:38 AM Performed by: Myrtie Soman Pre-anesthesia Checklist: Patient identified, Emergency Drugs available, Suction available and Patient being monitored Patient Re-evaluated:Patient Re-evaluated prior to inductionOxygen Delivery Method: Circle system utilized Preoxygenation: Pre-oxygenation with 100% oxygen Intubation Type: IV induction Ventilation: Mask ventilation without difficulty LMA: LMA inserted LMA Size: 4.0 Number of attempts: 1 Airway Equipment and Method: Bite block Placement Confirmation: positive ETCO2 Tube secured with: Tape Dental Injury: Teeth and Oropharynx as per pre-operative assessment  Comments: Unable to ventilate patient adequately with LMA.  Will convert to OETT. Dr. Kalman Shan present and supervising.

## 2016-05-14 NOTE — Anesthesia Preprocedure Evaluation (Signed)
Anesthesia Evaluation  Patient identified by MRN, date of birth, ID band Patient awake    Reviewed: Allergy & Precautions, NPO status , Patient's Chart, lab work & pertinent test results  Airway Mallampati: II  TM Distance: <3 FB Neck ROM: Full    Dental no notable dental hx.    Pulmonary neg pulmonary ROS, former smoker,    Pulmonary exam normal breath sounds clear to auscultation       Cardiovascular hypertension, Normal cardiovascular exam Rhythm:Regular Rate:Normal     Neuro/Psych negative neurological ROS  negative psych ROS   GI/Hepatic negative GI ROS, Neg liver ROS,   Endo/Other  diabetesMorbid obesity  Renal/GU negative Renal ROS  negative genitourinary   Musculoskeletal negative musculoskeletal ROS (+)   Abdominal   Peds negative pediatric ROS (+)  Hematology negative hematology ROS (+)   Anesthesia Other Findings   Reproductive/Obstetrics negative OB ROS                             Anesthesia Physical Anesthesia Plan  ASA: III  Anesthesia Plan: General   Post-op Pain Management:    Induction: Intravenous  Airway Management Planned: LMA  Additional Equipment:   Intra-op Plan:   Post-operative Plan: Extubation in OR  Informed Consent: I have reviewed the patients History and Physical, chart, labs and discussed the procedure including the risks, benefits and alternatives for the proposed anesthesia with the patient or authorized representative who has indicated his/her understanding and acceptance.   Dental advisory given  Plan Discussed with: CRNA and Surgeon  Anesthesia Plan Comments:         Anesthesia Quick Evaluation

## 2016-05-14 NOTE — Anesthesia Procedure Notes (Signed)
Procedure Name: Intubation Date/Time: 05/14/2016 7:42 AM Performed by: Myrtie Soman Pre-anesthesia Checklist: Patient identified, Emergency Drugs available, Suction available and Patient being monitored Patient Re-evaluated:Patient Re-evaluated prior to inductionOxygen Delivery Method: Circle system utilized Preoxygenation: Pre-oxygenation with 100% oxygen Intubation Type: IV induction Ventilation: Mask ventilation without difficulty Laryngoscope Size: Mac and 4 Grade View: Grade I Tube type: Oral Tube size: 7.0 mm Number of attempts: 1 Airway Equipment and Method: Stylet and Oral airway Placement Confirmation: ETT inserted through vocal cords under direct vision,  positive ETCO2 and breath sounds checked- equal and bilateral Secured at: 21 cm Tube secured with: Tape Dental Injury: Teeth and Oropharynx as per pre-operative assessment  Comments: Easy mask ventilation, Direct view of VC's, AOI.

## 2016-05-15 ENCOUNTER — Encounter (HOSPITAL_BASED_OUTPATIENT_CLINIC_OR_DEPARTMENT_OTHER): Payer: Self-pay | Admitting: Obstetrics and Gynecology

## 2016-05-16 ENCOUNTER — Telehealth: Payer: Self-pay | Admitting: Family Medicine

## 2016-05-16 NOTE — Telephone Encounter (Signed)
No appt in system Needs f/u in May. Needs fasting lipid, cmp and EGFR and HBA1C and vit D and TSH 1 week prior, thanks

## 2016-05-20 NOTE — Telephone Encounter (Signed)
Labs already ordered. Just needs appt

## 2016-05-24 NOTE — Telephone Encounter (Signed)
Scheduled 07/11/26 at 2:40/LAL

## 2016-05-28 ENCOUNTER — Ambulatory Visit: Payer: PPO

## 2016-06-05 ENCOUNTER — Ambulatory Visit: Payer: PPO

## 2016-06-24 ENCOUNTER — Ambulatory Visit (INDEPENDENT_AMBULATORY_CARE_PROVIDER_SITE_OTHER): Payer: PPO

## 2016-06-24 VITALS — BP 138/74 | HR 83 | Temp 98.8°F | Ht 69.0 in | Wt 279.0 lb

## 2016-06-24 DIAGNOSIS — Z0000 Encounter for general adult medical examination without abnormal findings: Secondary | ICD-10-CM

## 2016-06-24 NOTE — Patient Instructions (Addendum)
Theresa Adkins , Thank you for taking time to come for your Medicare Wellness Visit. I appreciate your ongoing commitment to your health goals. Please review the following plan we discussed and let me know if I can assist you in the future.   Screening recommendations/referrals: Colonoscopy: Up to date, next due 01/2020 Mammogram: Up to date, next due 03/2017 Bone Density: Up to date Diabetic Eye Exam: Up to date, next due 01/2017 Recommended yearly dental visit for hygiene and checkup  Vaccinations: Influenza vaccine: Up to date, next due 09/2016 Pneumococcal vaccine: Up to date Tdap vaccine: Up to date, next due 05/2020 Shingles vaccine: Up to date  Advanced directives: Advance directive discussed with you today. Once this is complete please bring a copy in to our office so we can scan it into your chart.  Conditions/risks identified: Obese, recommend starting a routine exercise program at least 3 days a week for 30-45 minutes at a time as tolerated.   Next appointment: Follow up with Dr. Moshe Cipro on 07/10/2016 at 2:40 pm. Follow up in 1 year for your annual wellness visit.    Preventive Care 34 Years and Older, Female Preventive care refers to lifestyle choices and visits with your health care provider that can promote health and wellness. What does preventive care include?  A yearly physical exam. This is also called an annual well check.  Dental exams once or twice a year.  Routine eye exams. Ask your health care provider how often you should have your eyes checked.  Personal lifestyle choices, including:  Daily care of your teeth and gums.  Regular physical activity.  Eating a healthy diet.  Avoiding tobacco and drug use.  Limiting alcohol use.  Practicing safe sex.  Taking low-dose aspirin every day.  Taking vitamin and mineral supplements as recommended by your health care provider. What happens during an annual well check? The services and screenings done by your  health care provider during your annual well check will depend on your age, overall health, lifestyle risk factors, and family history of disease. Counseling  Your health care provider may ask you questions about your:  Alcohol use.  Tobacco use.  Drug use.  Emotional well-being.  Home and relationship well-being.  Sexual activity.  Eating habits.  History of falls.  Memory and ability to understand (cognition).  Work and work Statistician.  Reproductive health. Screening  You may have the following tests or measurements:  Height, weight, and BMI.  Blood pressure.  Lipid and cholesterol levels. These may be checked every 5 years, or more frequently if you are over 74 years old.  Skin check.  Lung cancer screening. You may have this screening every year starting at age 60 if you have a 30-pack-year history of smoking and currently smoke or have quit within the past 15 years.  Fecal occult blood test (FOBT) of the stool. You may have this test every year starting at age 18.  Flexible sigmoidoscopy or colonoscopy. You may have a sigmoidoscopy every 5 years or a colonoscopy every 10 years starting at age 55.  Hepatitis C blood test.  Hepatitis B blood test.  Sexually transmitted disease (STD) testing.  Diabetes screening. This is done by checking your blood sugar (glucose) after you have not eaten for a while (fasting). You may have this done every 1-3 years.  Bone density scan. This is done to screen for osteoporosis. You may have this done starting at age 66.  Mammogram. This may be done every 1-2  years. Talk to your health care provider about how often you should have regular mammograms. Talk with your health care provider about your test results, treatment options, and if necessary, the need for more tests. Vaccines  Your health care provider may recommend certain vaccines, such as:  Influenza vaccine. This is recommended every year.  Tetanus, diphtheria, and  acellular pertussis (Tdap, Td) vaccine. You may need a Td booster every 10 years.  Zoster vaccine. You may need this after age 82.  Pneumococcal 13-valent conjugate (PCV13) vaccine. One dose is recommended after age 86.  Pneumococcal polysaccharide (PPSV23) vaccine. One dose is recommended after age 71. Talk to your health care provider about which screenings and vaccines you need and how often you need them. This information is not intended to replace advice given to you by your health care provider. Make sure you discuss any questions you have with your health care provider. Document Released: 03/03/2015 Document Revised: 10/25/2015 Document Reviewed: 12/06/2014 Elsevier Interactive Patient Education  2017 [place] Prevention in the Home Falls can cause injuries. They can happen to people of all ages. There are many things you can do to make your home safe and to help prevent falls. What can I do on the outside of my home?  Regularly fix the edges of walkways and driveways and fix any cracks.  Remove anything that might make you trip as you walk through a door, such as a raised step or threshold.  Trim any bushes or trees on the path to your home.  Use bright outdoor lighting.  Clear any walking paths of anything that might make someone trip, such as rocks or tools.  Regularly check to see if handrails are loose or broken. Make sure that both sides of any steps have handrails.  Any raised decks and porches should have guardrails on the edges.  Have any leaves, snow, or ice cleared regularly.  Use sand or salt on walking paths during winter.  Clean up any spills in your garage right away. This includes oil or grease spills. What can I do in the bathroom?  Use night lights.  Install grab bars by the toilet and in the tub and shower. Do not use towel bars as grab bars.  Use non-skid mats or decals in the tub or shower.  If you need to sit down in the shower, use  a plastic, non-slip stool.  Keep the floor dry. Clean up any water that spills on the floor as soon as it happens.  Remove soap buildup in the tub or shower regularly.  Attach bath mats securely with double-sided non-slip rug tape.  Do not have throw rugs and other things on the floor that can make you trip. What can I do in the bedroom?  Use night lights.  Make sure that you have a light by your bed that is easy to reach.  Do not use any sheets or blankets that are too big for your bed. They should not hang down onto the floor.  Have a firm chair that has side arms. You can use this for support while you get dressed.  Do not have throw rugs and other things on the floor that can make you trip. What can I do in the kitchen?  Clean up any spills right away.  Avoid walking on wet floors.  Keep items that you use a lot in easy-to-reach places.  If you need to reach something above you, use a strong step  stool that has a grab bar.  Keep electrical cords out of the way.  Do not use floor polish or wax that makes floors slippery. If you must use wax, use non-skid floor wax.  Do not have throw rugs and other things on the floor that can make you trip. What can I do with my stairs?  Do not leave any items on the stairs.  Make sure that there are handrails on both sides of the stairs and use them. Fix handrails that are broken or loose. Make sure that handrails are as long as the stairways.  Check any carpeting to make sure that it is firmly attached to the stairs. Fix any carpet that is loose or worn.  Avoid having throw rugs at the top or bottom of the stairs. If you do have throw rugs, attach them to the floor with carpet tape.  Make sure that you have a light switch at the top of the stairs and the bottom of the stairs. If you do not have them, ask someone to add them for you. What else can I do to help prevent falls?  Wear shoes that:  Do not have high heels.  Have  rubber bottoms.  Are comfortable and fit you well.  Are closed at the toe. Do not wear sandals.  If you use a stepladder:  Make sure that it is fully opened. Do not climb a closed stepladder.  Make sure that both sides of the stepladder are locked into place.  Ask someone to hold it for you, if possible.  Clearly mark and make sure that you can see:  Any grab bars or handrails.  First and last steps.  Where the edge of each step is.  Use tools that help you move around (mobility aids) if they are needed. These include:  Canes.  Walkers.  Scooters.  Crutches.  Turn on the lights when you go into a dark area. Replace any light bulbs as soon as they burn out.  Set up your furniture so you have a clear path. Avoid moving your furniture around.  If any of your floors are uneven, fix them.  If there are any pets around you, be aware of where they are.  Review your medicines with your doctor. Some medicines can make you feel dizzy. This can increase your chance of falling. Ask your doctor what other things that you can do to help prevent falls. This information is not intended to replace advice given to you by your health care provider. Make sure you discuss any questions you have with your health care provider. Document Released: 12/01/2008 Document Revised: 07/13/2015 Document Reviewed: 03/11/2014 Elsevier Interactive Patient Education  2017 [employer].

## 2016-06-24 NOTE — Progress Notes (Signed)
Subjective:   Theresa Adkins is a 71 y.o. female who presents for Medicare Annual (Subsequent) preventive examination.  Review of Systems: Cardiac Risk Factors include: advanced age (>58men, >82 women);diabetes mellitus;dyslipidemia;hypertension;obesity (BMI >30kg/m2);sedentary lifestyle     Objective:     Vitals: BP 138/74   Pulse 83   Temp 98.8 F (37.1 C) (Oral)   Ht 5\' 9"  (1.753 m)   Wt 279 lb 0.6 oz (126.6 kg)   SpO2 98%   BMI 41.21 kg/m   Body mass index is 41.21 kg/m.   Tobacco History  Smoking Status  . Former Smoker  . Packs/day: 1.00  . Years: 25.00  . Types: Cigarettes  . Quit date: 02/17/2002  Smokeless Tobacco  . Never Used     Counseling given: Not Answered   Past Medical History:  Diagnosis Date  . Allergic rhinitis, seasonal   . Arthritis   . Breast cancer [facility]) dx 02/ 2015---  oncologist-  dr shadad/ dr Lisbeth Renshaw   dx Right breast upper-outer quadrant DCIS, high grade (Tis N0), ER+, PR negative--- 07-01-2013  s/p  right breast lumpectomy w/ sln bxs and radiation therapy (08-24-2013 to 09-20-2013)  . Diabetes mellitus type II   . Diabetic neuropathy ([place])   . Heart murmur   . Hidradenitis axillaris    chronic  . History of adenomatous polyp of colon    03-04-2008  tubular adenoma  . History of radiation therapy 08-24-2013 to 09-20-2013   total 50Gy  . Hyperlipidemia   . Hypertension   . Thickened endometrium   . Wears dentures    upper  . Wears glasses    Past Surgical History:  Procedure Laterality Date  . BREAST LUMPECTOMY WITH NEEDLE LOCALIZATION AND AXILLARY SENTINEL LYMPH NODE BX Right 07/01/2013   Procedure: BREAST LUMPECTOMY WITH NEEDLE LOCALIZATION AND AXILLARY SENTINEL LYMPH NODE BX;  Surgeon: Merrie Roof, MD;  Location: [facility];  Service: General;  Laterality: Right;  . BREAST SURGERY  1970s   benign tumor removed from unspecified breast   . COLONOSCOPY W/ POLYPECTOMY  03/04/2008  . DILATATION & CURRETTAGE/HYSTEROSCOPY WITH  RESECTOCOPE N/A 05/14/2016   Procedure: [place];  Surgeon: Eldred Manges, MD;  Location: [facility];  Service: Gynecology;  Laterality: N/A;   Family History  Problem Relation Age of Onset  . Cancer Brother 65    bone   . Obesity Sister   . Heart disease Mother   . Cancer Sister 9    leukemia   . Obesity Sister    History  Sexual Activity  . Sexual activity: No    Outpatient Encounter Prescriptions as of 06/24/2016  Medication Sig  . amLODipine (NORVASC) 5 MG tablet TAKE ONE TABLET BY MOUTH ONCE DAILY (Patient taking differently: TAKE ONE TABLET BY MOUTH ONCE DAILY--- takes in pm)  . atorvastatin (LIPITOR) 10 MG tablet TAKE ONE TABLET BY MOUTH ONCE DAILY (Patient taking differently: TAKE ONE TABLET BY MOUTH ONCE DAILY---  takes in pm)  . bisoprolol-hydrochlorothiazide (ZIAC) 10-6.25 MG tablet TAKE ONE TABLET BY MOUTH ONCE DAILY (Patient taking differently: TAKE ONE TABLET BY MOUTH ONCE DAILY--  takes in pm)  . metFORMIN (GLUCOPHAGE) 1000 MG tablet TAKE ONE TABLET BY MOUTH ONCE DAILY WITH A MEAL (Patient taking differently: TAKE ONE TABLET BY MOUTH ONCE DAILY WITH A MEAL-- takes in pm)  . Vitamin D, Ergocalciferol, (DRISDOL) 50000 units CAPS capsule Take 1 capsule (50,000 Units total) by mouth every 7 (seven)  days.  . [DISCONTINUED] ibuprofen (ADVIL,MOTRIN) 600 MG tablet Ibuprofen 600 mg orally every 6 hours for 3 days. Then, use ibuprofen 600 mg every 6 hours as needed for pain   No facility-administered encounter medications on file as of 06/24/2016.     Activities of Daily Living In your present state of health, do you have any difficulty performing the following activities: 06/24/2016 05/14/2016  Hearing? N N  Vision? N N  Difficulty concentrating or making decisions? N N  Walking or climbing stairs? N N  Dressing or bathing? N N  Doing errands, shopping? N -  Preparing Food and eating ? N -  Using the Toilet? N -  In  the past six months, have you accidently leaked urine? N -  Do you have problems with loss of bowel control? N -  Managing your Medications? N -  Managing your Finances? N -  Housekeeping or managing your Housekeeping? N -  Some recent data might be hidden    Patient Care Team: Fayrene Helper, MD as PCP - General Rothbart, Cristopher Estimable, MD (Cardiology) Jovita Kussmaul, MD as Consulting Physician (General Surgery) Rutherford Guys, MD as Consulting Physician (Ophthalmology)    Assessment:    Exercise Activities and Dietary recommendations Current Exercise Habits: The patient does not participate in regular exercise at present, Exercise limited by: None identified  Goals    . Exercise 3x per week (30 min per time)          Recommend starting a routine exercise program at least 3 days a week for 30-45 minutes at a time as tolerated.        Fall Risk Fall Risk  06/24/2016 05/03/2015 09/01/2014 04/28/2014 07/15/2013  Falls in the past year? No No No No No   Depression Screen PHQ 2/9 Scores 06/24/2016 05/03/2015 07/15/2013  PHQ - 2 Score 0 5 1  PHQ- 9 Score - 12 3     Cognitive Function MMSE - Mini Mental State Exam 04/28/2014  Orientation to time 5  Orientation to Place 5  Registration 3  Attention/ Calculation 5  Recall 3  Language- name 2 objects 2  Language- repeat 1  Language- follow 3 step command 3  Language- read & follow direction 1  Write a sentence 1  Copy design 1  Total score 30     6CIT Screen 06/24/2016  What Year? 0 points  What month? 0 points  What time? 0 points  Count back from 20 0 points  Months in reverse 0 points  Repeat phrase 0 points  Total Score 0    Immunization History  Administered Date(s) Administered  . Influenza Split 01/13/2012  . Influenza,inj,Quad PF,36+ Mos 11/13/2012, 11/25/2013, 11/01/2014, 12/21/2015  . Pneumococcal Conjugate-13 12/29/2014  . Pneumococcal Polysaccharide-23 01/19/2016  . Tdap 06/05/2010  . Zoster 06/05/2010    Screening Tests Health Maintenance  Topic Date Due  . HEMOGLOBIN A1C  07/14/2016  . FOOT EXAM  09/17/2016  . URINE MICROALBUMIN  09/17/2016  . INFLUENZA VACCINE  09/18/2016  . OPHTHALMOLOGY EXAM  02/19/2017  . MAMMOGRAM  04/15/2018  . COLONOSCOPY  02/17/2020  . TETANUS/TDAP  06/04/2020  . DEXA SCAN  Completed  . Hepatitis C Screening  Completed  . PNA vac Low Risk Adult  Completed      Plan:    I have personally reviewed and noted the following in the patient's chart:   . Medical and social history . Use of alcohol, tobacco or illicit drugs  .  Current medications and supplements . Functional ability and status . Nutritional status . Physical activity . Advanced directives . List of other physicians . Hospitalizations, surgeries, and ER visits in previous 12 months . Vitals . Screenings to include cognitive, depression, and falls . Referrals and appointments  In addition, I have reviewed and discussed with patient certain preventive protocols, quality metrics, and best practice recommendations. A written personalized care plan for preventive services as well as general preventive health recommendations were provided to patient.     Stormy Fabian, LPN  02/23/1094

## 2016-07-04 DIAGNOSIS — E1140 Type 2 diabetes mellitus with diabetic neuropathy, unspecified: Secondary | ICD-10-CM | POA: Diagnosis not present

## 2016-07-04 DIAGNOSIS — E782 Mixed hyperlipidemia: Secondary | ICD-10-CM | POA: Diagnosis not present

## 2016-07-04 LAB — COMPLETE METABOLIC PANEL WITH GFR
ALBUMIN: 4.5 g/dL (ref 3.6–5.1)
ALK PHOS: 54 U/L (ref 33–130)
ALT: 11 U/L (ref 6–29)
AST: 11 U/L (ref 10–35)
BILIRUBIN TOTAL: 0.5 mg/dL (ref 0.2–1.2)
BUN: 8 mg/dL (ref 7–25)
CALCIUM: 9.2 mg/dL (ref 8.6–10.4)
CO2: 27 mmol/L (ref 20–31)
Chloride: 103 mmol/L (ref 98–110)
Creat: 0.76 mg/dL (ref 0.60–0.93)
GFR, EST NON AFRICAN AMERICAN: 80 mL/min (ref >=60)
GFR, Est African American: >89 mL/min (ref >=60)
GLUCOSE: 144 mg/dL — AB (ref 65–99)
POTASSIUM: 4.4 mmol/L (ref 3.5–5.3)
Sodium: 141 mmol/L (ref 135–146)
TOTAL PROTEIN: 7.1 g/dL (ref 6.1–8.1)

## 2016-07-04 LAB — LIPID PANEL
CHOL/HDL RATIO: 3.3 ratio (ref <5.0)
CHOLESTEROL: 150 mg/dL (ref <200)
HDL: 45 mg/dL — AB (ref >50)
LDL Cholesterol: 63 mg/dL (ref <100)
Triglycerides: 208 mg/dL — ABNORMAL HIGH (ref <150)
VLDL: 42 mg/dL — ABNORMAL HIGH (ref <30)

## 2016-07-05 LAB — HEMOGLOBIN A1C
HEMOGLOBIN A1C: 7.3 % — AB (ref <5.7)
Mean Plasma Glucose: 163 mg/dL

## 2016-07-10 ENCOUNTER — Encounter: Payer: Self-pay | Admitting: Family Medicine

## 2016-07-10 ENCOUNTER — Ambulatory Visit (INDEPENDENT_AMBULATORY_CARE_PROVIDER_SITE_OTHER): Payer: PPO | Admitting: Family Medicine

## 2016-07-10 VITALS — BP 130/74 | HR 76 | Temp 97.1°F | Resp 18 | Ht 69.0 in | Wt 281.0 lb

## 2016-07-10 DIAGNOSIS — E559 Vitamin D deficiency, unspecified: Secondary | ICD-10-CM | POA: Diagnosis not present

## 2016-07-10 DIAGNOSIS — E1140 Type 2 diabetes mellitus with diabetic neuropathy, unspecified: Secondary | ICD-10-CM | POA: Diagnosis not present

## 2016-07-10 DIAGNOSIS — E782 Mixed hyperlipidemia: Secondary | ICD-10-CM | POA: Diagnosis not present

## 2016-07-10 DIAGNOSIS — I10 Essential (primary) hypertension: Secondary | ICD-10-CM | POA: Diagnosis not present

## 2016-07-10 MED ORDER — UNABLE TO FIND: 0 refills | Status: DC

## 2016-07-10 NOTE — Assessment & Plan Note (Signed)
Deteriorated. Patient re-educated about  the importance of commitment to a  minimum of 150 minutes of exercise per week.  The importance of healthy food choices with portion control discussed. Encouraged to start a food diary, count calories and to consider  joining a support group. Sample diet sheets offered. Goals set by the patient for the next several months.   Weight /BMI 07/10/2016 06/24/2016 05/14/2016  WEIGHT 281 lb 279 lb 0.6 oz 280 lb  HEIGHT 5\' 9"  5\' 9"  5\' 9"   BMI 41.5 kg/m2 41.21 kg/m2 41.35 kg/m2

## 2016-07-10 NOTE — Assessment & Plan Note (Signed)
Deteriorated but still controlled Theresa Adkins is reminded of the importance of commitment to daily physical activity for 30 minutes or more, as able and the need to limit carbohydrate intake to 30 to 60 grams per meal to help with blood sugar control.   The need to take medication as prescribed, test blood sugar as directed, and to call between visits if there is a concern that blood sugar is uncontrolled is also discussed.   Theresa Adkins is reminded of the importance of daily foot exam, annual eye examination, and good blood sugar, blood pressure and cholesterol control.  Diabetic Labs Latest Ref Rng & Units 07/04/2016 01/15/2016 09/18/2015 09/12/2015 04/26/2015  HbA1c <5.7 % 7.3(H) 7.0(H) - 7.3(H) 7.1(H)  Microalbumin Not Estab. ug/mL - - 9.4(H) - -  Micro/Creat Ratio 0.0 - 30.0 mg/g creat - - 7.4 - -  Chol <200 mg/dL 150 155 - 165 172  HDL >50 mg/dL 45(L) 46(L) - 51 49  Calc LDL <100 mg/dL 63 66 - 70 79  Triglycerides <150 mg/dL 208(H) 216(H) - 219(H) 221(H)  Creatinine 0.60 - 0.93 mg/dL 0.76 0.77 - 0.73 0.76   BP/Weight 07/10/2016 06/24/2016 05/14/2016 01/19/2016 09/18/2015 05/03/2015 60/15/6153  Systolic BP 794 327 614 709 295 747 340  Diastolic BP 74 74 69 78 72 78 74  Wt. (Lbs) 281 279.04 280 276 270 267.12 255.8  BMI 41.5 41.21 41.35 40.76 39.87 39.43 37.76   Foot/eye exam completion dates Latest Ref Rng & Units 07/10/2016 02/20/2016  Eye Exam No Retinopathy - No Retinopathy  Foot exam Order - - -  Foot Form Completion - Done -

## 2016-07-10 NOTE — Assessment & Plan Note (Signed)
Hyperlipidemia:Low fat diet discussed and encouraged.   Lipid Panel  Lab Results  Component Value Date   CHOL 150 07/04/2016   HDL 45 (L) 07/04/2016   LDLCALC 63 07/04/2016   TRIG 208 (H) 07/04/2016   CHOLHDL 3.3 07/04/2016   Needs to reduce fat in diet and commit to 150 minutes physical activity weekly

## 2016-07-10 NOTE — Assessment & Plan Note (Signed)
Controlled, no change in medication DASH diet and commitment to daily physical activity for a minimum of 30 minutes discussed and encouraged, as a part of hypertension management. The importance of attaining a healthy weight is also discussed.  BP/Weight 07/10/2016 06/24/2016 05/14/2016 01/19/2016 09/18/2015 05/03/2015 53/79/4327  Systolic BP 614 709 295 747 340 370 964  Diastolic BP 74 74 69 78 72 78 74  Wt. (Lbs) 281 279.04 280 276 270 267.12 255.8  BMI 41.5 41.21 41.35 40.76 39.87 39.43 37.76

## 2016-07-10 NOTE — Patient Instructions (Addendum)
F/u first week in December, call if you need me before  Fasting lipid, cmp, and EGFR, hBA1C, vit D, CBC, and tSH 1 Nov 28 or after  It is important that you exercise regularly at least 30 minutes 5 times a week. If you develop chest pain, have severe difficulty breathing, or feel very tired, stop exercising immediately and seek medical attention    Please work on good  health habits so that your health will improve. 1. Commitment to daily physical activity for 30 to 60  minutes, if you are able to do this.  2. Commitment to wise food choices. Aim for half of your  food intake to be vegetable and fruit, one quarter starchy foods, and one quarter protein. Try to eat on a regular schedule  3 meals per day, snacking between meals should be limited to vegetables or fruits or small portions of nuts. 64 ounces of water per day is generally recommended, unless you have specific health conditions, like heart failure or kidney failure where you will need to limit fluid intake.  3. Commitment to sufficient and a  good quality of physical and mental rest daily, generally between 6 to 8 hours per day.  WITH PERSISTANCE AND PERSEVERANCE, THE IMPOSSIBLE , BECOMES THE NORM!

## 2016-07-10 NOTE — Assessment & Plan Note (Signed)
Start OTC vit D3 2000 IU daily

## 2016-07-10 NOTE — Progress Notes (Signed)
Theresa Adkins     MRN: [id number]      DOB: Jun 12, 1945   HPI Theresa Adkins is here for follow up and re-evaluation of chronic medical conditions, medication management and review of any available recent lab and radiology data.  Preventive health is updated, specifically  Cancer screening and Immunization.   Questions or concerns regarding consultations or procedures which the PT has had in the interim are  Addressed.Recently had d/c and polyp[ectomy, thankfully, pathology was benign The PT denies any adverse reactions to current medications since the last visit.  There are no new concerns.  There are no specific complaints  Concerned about weight gain Is not committed to healthy eating and is not currently exercising  ROS Denies recent fever or chills. Denies sinus pressure, nasal congestion, ear pain or sore throat. Denies chest congestion, productive cough or wheezing. Denies chest pains, palpitations and leg swelling Denies abdominal pain, nausea, vomiting,diarrhea or constipation.   Denies dysuria, frequency, hesitancy or incontinence. Denies joint pain, swelling and limitation in mobility. Denies headaches, seizures, numbness, or tingling. Denies depression, anxiety or insomnia. Denies skin break down or rash.   PE  BP 130/74 (BP Location: Left Arm, Patient Position: Sitting, Cuff Size: Large)   Pulse 76   Temp 97.1 F (36.2 C) (Temporal)   Resp 18   Ht 5\' 9"  (1.753 m)   Wt 281 lb (127.5 kg)   SpO2 94%   BMI 41.50 kg/m   Patient alert and oriented and in no cardiopulmonary distress.  HEENT: No facial asymmetry, EOMI,   oropharynx pink and moist.  Neck supple no JVD, no mass.  Chest: Clear to auscultation bilaterally.  CVS: S1, S2 no murmurs, no S3.Regular rate.  ABD: Soft non tender.   Ext: No edema  MS: Adequate ROM spine, shoulders, hips and knees.  Skin: Intact, no ulcerations or rash noted.  Psych: Good eye contact, normal affect. Memory intact not  anxious or depressed appearing.  CNS: CN 2-12 intact, power,  normal throughout.no focal deficits noted.   Assessment & Plan  Hypertension goal BP (blood pressure) < 130/80 Controlled, no change in medication DASH diet and commitment to daily physical activity for a minimum of 30 minutes discussed and encouraged, as a part of hypertension management. The importance of attaining a healthy weight is also discussed.  BP/Weight 07/10/2016 06/24/2016 05/14/2016 01/19/2016 09/18/2015 05/03/2015 71/69/6789  Systolic BP 381 017 510 258 527 782 423  Diastolic BP 74 74 69 78 72 78 74  Wt. (Lbs) 281 279.04 280 276 270 267.12 255.8  BMI 41.5 41.21 41.35 40.76 39.87 39.43 37.76       Controlled type 2 diabetes with neuropathy (HCC) Deteriorated but still controlled Theresa Adkins is reminded of the importance of commitment to daily physical activity for 30 minutes or more, as able and the need to limit carbohydrate intake to 30 to 60 grams per meal to help with blood sugar control.   The need to take medication as prescribed, test blood sugar as directed, and to call between visits if there is a concern that blood sugar is uncontrolled is also discussed.   Theresa Adkins is reminded of the importance of daily foot exam, annual eye examination, and good blood sugar, blood pressure and cholesterol control.  Diabetic Labs Latest Ref Rng & Units 07/04/2016 01/15/2016 09/18/2015 09/12/2015 04/26/2015  HbA1c <5.7 % 7.3(H) 7.0(H) - 7.3(H) 7.1(H)  Microalbumin Not Estab. ug/mL - - 9.4(H) - -  Micro/Creat Ratio 0.0 -  30.0 mg/g creat - - 7.4 - -  Chol <200 mg/dL 150 155 - 165 172  HDL >50 mg/dL 45(L) 46(L) - 51 49  Calc LDL <100 mg/dL 63 66 - 70 79  Triglycerides <150 mg/dL 208(H) 216(H) - 219(H) 221(H)  Creatinine 0.60 - 0.93 mg/dL 0.76 0.77 - 0.73 0.76   BP/Weight 07/10/2016 06/24/2016 05/14/2016 01/19/2016 09/18/2015 05/03/2015 36/14/4315  Systolic BP 400 867 619 509 326 712 458  Diastolic BP 74 74 69 78 72 78 74  Wt. (Lbs)  281 279.04 280 276 270 267.12 255.8  BMI 41.5 41.21 41.35 40.76 39.87 39.43 37.76   Foot/eye exam completion dates Latest Ref Rng & Units 07/10/2016 02/20/2016  Eye Exam No Retinopathy - No Retinopathy  Foot exam Order - - -  Foot Form Completion - Done -        Vitamin D deficiency Start OTC vit D3 2000 IU daily  Hyperlipidemia Hyperlipidemia:Low fat diet discussed and encouraged.   Lipid Panel  Lab Results  Component Value Date   CHOL 150 07/04/2016   HDL 45 (L) 07/04/2016   LDLCALC 63 07/04/2016   TRIG 208 (H) 07/04/2016   CHOLHDL 3.3 07/04/2016   Needs to reduce fat in diet and commit to 150 minutes physical activity weekly    Obesity, Class III, BMI 40-49.9 (morbid obesity) ([place]) Deteriorated. Patient re-educated about  the importance of commitment to a  minimum of 150 minutes of exercise per week.  The importance of healthy food choices with portion control discussed. Encouraged to start a food diary, count calories and to consider  joining a support group. Sample diet sheets offered. Goals set by the patient for the next several months.   Weight /BMI 07/10/2016 06/24/2016 05/14/2016  WEIGHT 281 lb 279 lb 0.6 oz 280 lb  HEIGHT 5\' 9"  5\' 9"  5\' 9"   BMI 41.5 kg/m2 41.21 kg/m2 41.35 kg/m2

## 2016-07-22 NOTE — Addendum Note (Signed)
Addendum  created 07/22/16 1243 by Myrtie Soman, MD   Sign clinical note

## 2016-07-22 NOTE — Anesthesia Postprocedure Evaluation (Signed)
Anesthesia Post Note  Patient: Theresa Adkins  Procedure(s) Performed: Procedure(s) (LRB): DILATATION & CURETTAGE/HYSTEROSCOPY WITH RESECTOCOPE (N/A)     Anesthesia Post Evaluation  Last Vitals:  Vitals:   05/14/16 0930 05/14/16 1040  BP: 130/60 (!) 168/69  Pulse: 78 89  Resp: 12 12  Temp:  36.6 C    Last Pain:  Vitals:   05/15/16 1346  TempSrc:   PainSc: 1                  Lynard Postlewait S

## 2016-09-12 ENCOUNTER — Other Ambulatory Visit: Payer: Self-pay | Admitting: Family Medicine

## 2016-09-12 DIAGNOSIS — E785 Hyperlipidemia, unspecified: Secondary | ICD-10-CM

## 2016-10-06 ENCOUNTER — Other Ambulatory Visit: Payer: Self-pay | Admitting: Family Medicine

## 2016-10-07 NOTE — Telephone Encounter (Signed)
Seen 5 23 18 

## 2016-10-28 ENCOUNTER — Other Ambulatory Visit: Payer: Self-pay | Admitting: Family Medicine

## 2016-10-28 NOTE — Telephone Encounter (Signed)
Seen 5 23 18

## 2016-12-03 ENCOUNTER — Ambulatory Visit (INDEPENDENT_AMBULATORY_CARE_PROVIDER_SITE_OTHER): Payer: PPO

## 2016-12-03 DIAGNOSIS — Z23 Encounter for immunization: Secondary | ICD-10-CM | POA: Diagnosis not present

## 2017-01-13 ENCOUNTER — Telehealth: Payer: Self-pay | Admitting: *Deleted

## 2017-01-13 DIAGNOSIS — E782 Mixed hyperlipidemia: Secondary | ICD-10-CM

## 2017-01-13 DIAGNOSIS — E1140 Type 2 diabetes mellitus with diabetic neuropathy, unspecified: Secondary | ICD-10-CM

## 2017-01-13 NOTE — Telephone Encounter (Signed)
Done

## 2017-01-13 NOTE — Telephone Encounter (Signed)
Patient called requesting for her lab order to be ready at the front desk in the morning and she will come by to get it.

## 2017-01-14 DIAGNOSIS — E782 Mixed hyperlipidemia: Secondary | ICD-10-CM | POA: Diagnosis not present

## 2017-01-14 DIAGNOSIS — E1140 Type 2 diabetes mellitus with diabetic neuropathy, unspecified: Secondary | ICD-10-CM | POA: Diagnosis not present

## 2017-01-15 LAB — LIPID PANEL
CHOL/HDL RATIO: 3.7 (calc) (ref <5.0)
CHOLESTEROL: 167 mg/dL (ref <200)
HDL: 45 mg/dL — AB (ref >50)
LDL CHOLESTEROL (CALC): 87 mg/dL
Non-HDL Cholesterol (Calc): 122 mg/dL (calc) (ref <130)
Triglycerides: 265 mg/dL — ABNORMAL HIGH (ref <150)

## 2017-01-15 LAB — COMPLETE METABOLIC PANEL WITH GFR
AG Ratio: 1.6 (calc) (ref 1.0–2.5)
ALKALINE PHOSPHATASE (APISO): 66 U/L (ref 33–130)
ALT: 10 U/L (ref 6–29)
AST: 12 U/L (ref 10–35)
Albumin: 4.4 g/dL (ref 3.6–5.1)
BILIRUBIN TOTAL: 0.6 mg/dL (ref 0.2–1.2)
BUN: 7 mg/dL (ref 7–25)
CHLORIDE: 99 mmol/L (ref 98–110)
CO2: 30 mmol/L (ref 20–32)
CREATININE: 0.74 mg/dL (ref 0.60–0.93)
Calcium: 9.3 mg/dL (ref 8.6–10.4)
GFR, Est African American: 94 mL/min/{1.73_m2} (ref >=60)
GFR, Est Non African American: 82 mL/min/{1.73_m2} (ref >=60)
GLUCOSE: 127 mg/dL — AB (ref 65–99)
Globulin: 2.8 g/dL (calc) (ref 1.9–3.7)
Potassium: 4.4 mmol/L (ref 3.5–5.3)
Sodium: 139 mmol/L (ref 135–146)
Total Protein: 7.2 g/dL (ref 6.1–8.1)

## 2017-01-15 LAB — HEMOGLOBIN A1C
HEMOGLOBIN A1C: 7.4 %{Hb} — AB (ref <5.7)
Mean Plasma Glucose: 166 (calc)
eAG (mmol/L): 9.2 (calc)

## 2017-01-20 ENCOUNTER — Encounter: Payer: Self-pay | Admitting: Family Medicine

## 2017-01-20 ENCOUNTER — Ambulatory Visit (INDEPENDENT_AMBULATORY_CARE_PROVIDER_SITE_OTHER): Payer: PPO | Admitting: Family Medicine

## 2017-01-20 VITALS — BP 140/78 | HR 78 | Resp 16 | Ht 69.0 in | Wt 279.0 lb

## 2017-01-20 DIAGNOSIS — E1140 Type 2 diabetes mellitus with diabetic neuropathy, unspecified: Secondary | ICD-10-CM | POA: Diagnosis not present

## 2017-01-20 DIAGNOSIS — E559 Vitamin D deficiency, unspecified: Secondary | ICD-10-CM

## 2017-01-20 DIAGNOSIS — I10 Essential (primary) hypertension: Secondary | ICD-10-CM | POA: Diagnosis not present

## 2017-01-20 DIAGNOSIS — E785 Hyperlipidemia, unspecified: Secondary | ICD-10-CM

## 2017-01-20 MED ORDER — METFORMIN HCL 1000 MG PO TABS: ORAL_TABLET | ORAL | 1 refills | Status: DC

## 2017-01-20 MED ORDER — AMLODIPINE BESYLATE 5 MG PO TABS: 5.0000 mg | ORAL_TABLET | Freq: Every day | ORAL | 1 refills | Status: DC

## 2017-01-20 MED ORDER — ATORVASTATIN CALCIUM 10 MG PO TABS: 10.0000 mg | ORAL_TABLET | Freq: Every day | ORAL | 1 refills | Status: DC

## 2017-01-20 NOTE — Patient Instructions (Addendum)
Annual physical exam in 4. month, call if you need me before  Please schedule your diagnostric mammogram due in February, NEED this  Please schedule your follow up with gynecology, NEED this  Please increase vegetable intake, reduce  Starches, sweets, fatty foods, eat clean and green , NEED this!  Move 10 minutes 3 times every day for health , NEED this!   Sleep/ rest for 6 to 8 hours ever day, NEED this!   CBC, fasting lipid, cmp and EGFR, TSH, HBA1C, TSH and vit Dm and microalb in 4 months  Thank you  for choosing [facility]. We consider it a privelige to serve you.  Delivering excellent health care in a caring and  compassionate way is our goal.  Partnering with you,  so that together we can achieve this goal is our strategy.

## 2017-01-21 ENCOUNTER — Encounter: Payer: Self-pay | Admitting: Family Medicine

## 2017-01-21 NOTE — Assessment & Plan Note (Signed)
Uncontrolled, no med change DASH diet and commitment to daily physical activity for a minimum of 30 minutes discussed and encouraged, as a part of hypertension management. The importance of attaining a healthy weight is also discussed.  BP/Weight 01/20/2017 07/10/2016 06/24/2016 05/14/2016 01/19/2016 09/18/2015 2/33/6122  Systolic BP 449 753 005 110 211 173 567  Diastolic BP 78 74 74 69 78 72 78  Wt. (Lbs) 279 281 279.04 280 276 270 267.12  BMI 41.2 41.5 41.21 41.35 40.76 39.87 39.43

## 2017-01-21 NOTE — Assessment & Plan Note (Signed)
Deteriorated , but still controlled Theresa Adkins is reminded of the importance of commitment to daily physical activity for 30 minutes or more, as able and the need to limit carbohydrate intake to 30 to 60 grams per meal to help with blood sugar control.   The need to take medication as prescribed, test blood sugar as directed, and to call between visits if there is a concern that blood sugar is uncontrolled is also discussed.   Theresa Adkins is reminded of the importance of daily foot exam, annual eye examination, and good blood sugar, blood pressure and cholesterol control.  Diabetic Labs Latest Ref Rng & Units 01/14/2017 07/04/2016 01/15/2016 09/18/2015 09/12/2015  HbA1c <5.7 % of total Hgb 7.4(H) 7.3(H) 7.0(H) - 7.3(H)  Microalbumin Not Estab. ug/mL - - - 9.4(H) -  Micro/Creat Ratio 0.0 - 30.0 mg/g creat - - - 7.4 -  Chol <200 mg/dL 167 150 155 - 165  HDL >50 mg/dL 45(L) 45(L) 46(L) - 51  Calc LDL <100 mg/dL - 63 66 - 70  Triglycerides <150 mg/dL 265(H) 208(H) 216(H) - 219(H)  Creatinine 0.60 - 0.93 mg/dL 0.74 0.76 0.77 - 0.73   BP/Weight 01/20/2017 07/10/2016 06/24/2016 05/14/2016 01/19/2016 09/18/2015 9/37/1696  Systolic BP 789 381 017 510 258 527 782  Diastolic BP 78 74 74 69 78 72 78  Wt. (Lbs) 279 281 279.04 280 276 270 267.12  BMI 41.2 41.5 41.21 41.35 40.76 39.87 39.43   Foot/eye exam completion dates Latest Ref Rng & Units 07/10/2016 02/20/2016  Eye Exam No Retinopathy - No Retinopathy  Foot exam Order - - -  Foot Form Completion - Done -      Updated lab needed at/ before next visit.

## 2017-01-21 NOTE — Assessment & Plan Note (Signed)
Uncontrolled, needs to reduce fat intake Hyperlipidemia:Low fat diet discussed and encouraged.   Lipid Panel  Lab Results  Component Value Date   CHOL 167 01/14/2017   HDL 45 (L) 01/14/2017   LDLCALC 63 07/04/2016   TRIG 265 (H) 01/14/2017   CHOLHDL 3.7 01/14/2017   Updated lab needed at/ before next visit.

## 2017-01-21 NOTE — Progress Notes (Signed)
Theresa Adkins     MRN: [id number]      DOB: [birth year]   HPI Theresa Adkins is here for follow up and re-evaluation of chronic medical conditions, medication management and review of any available recent lab and radiology data.  Preventive health is updated, specifically  Cancer screening and Immunization.   Questions or concerns regarding consultations or procedures which the PT has had in the interim are  addressed. The PT denies any adverse reactions to current medications since the last visit.  There are no new concerns.  There are no specific complaints  Denies polyuria, polydipsia, blurred vision , or hypoglycemic episodes.   ROS Denies recent fever or chills. Denies sinus pressure, nasal congestion, ear pain or sore throat. Denies chest congestion, productive cough or wheezing. Denies chest pains, palpitations and leg swelling Denies abdominal pain, nausea, vomiting,diarrhea or constipation.   Denies dysuria, frequency, hesitancy or incontinence. Denies joint pain, swelling and limitation in mobility. Denies headaches, seizures, numbness, or tingling. Denies depression, anxiety or insomnia. Denies skin break down or rash.   PE  BP 140/78   Pulse 78   Resp 16   Ht 5\' 9"  (1.753 m)   Wt 279 lb (126.6 kg)   SpO2 93%   BMI 41.20 kg/m   Patient alert and oriented and in no cardiopulmonary distress.  HEENT: No facial asymmetry, EOMI,   oropharynx pink and moist.  Neck supple no JVD, no mass.  Chest: Clear to auscultation bilaterally.  CVS: S1, S2 no murmurs, no S3.Regular rate.  ABD: Soft non tender.   Ext: No edema  MS: Adequate ROM spine, shoulders, hips and knees.  Skin: Intact, no ulcerations or rash noted.  Psych: Good eye contact, normal affect. Memory intact not anxious or depressed appearing.  CNS: CN 2-12 intact, power,  normal throughout.no focal deficits noted.   Assessment & Plan  Hypertension goal BP (blood pressure) < 130/80 Uncontrolled, no med  change DASH diet and commitment to daily physical activity for a minimum of 30 minutes discussed and encouraged, as a part of hypertension management. The importance of attaining a healthy weight is also discussed.  BP/Weight 01/20/2017 07/10/2016 06/24/2016 05/14/2016 01/19/2016 09/18/2015 9/37/9024  Systolic BP 097 353 299 242 683 419 622  Diastolic BP 78 74 74 69 78 72 78  Wt. (Lbs) 279 281 279.04 280 276 270 267.12  BMI 41.2 41.5 41.21 41.35 40.76 39.87 39.43       Hyperlipidemia Uncontrolled, needs to reduce fat intake Hyperlipidemia:Low fat diet discussed and encouraged.   Lipid Panel  Lab Results  Component Value Date   CHOL 167 01/14/2017   HDL 45 (L) 01/14/2017   LDLCALC 63 07/04/2016   TRIG 265 (H) 01/14/2017   CHOLHDL 3.7 01/14/2017   Updated lab needed at/ before next visit.     Controlled type 2 diabetes with neuropathy ([place]) Deteriorated , but still controlled Theresa Adkins is reminded of the importance of commitment to daily physical activity for 30 minutes or more, as able and the need to limit carbohydrate intake to 30 to 60 grams per meal to help with blood sugar control.   The need to take medication as prescribed, test blood sugar as directed, and to call between visits if there is a concern that blood sugar is uncontrolled is also discussed.   Theresa Adkins is reminded of the importance of daily foot exam, annual eye examination, and good blood sugar, blood pressure and cholesterol control.  Diabetic Labs Latest  Ref Rng & Units 01/14/2017 07/04/2016 01/15/2016 09/18/2015 09/12/2015  HbA1c <5.7 % of total Hgb 7.4(H) 7.3(H) 7.0(H) - 7.3(H)  Microalbumin Not Estab. ug/mL - - - 9.4(H) -  Micro/Creat Ratio 0.0 - 30.0 mg/g creat - - - 7.4 -  Chol <200 mg/dL 167 150 155 - 165  HDL >50 mg/dL 45(L) 45(L) 46(L) - 51  Calc LDL <100 mg/dL - 63 66 - 70  Triglycerides <150 mg/dL 265(H) 208(H) 216(H) - 219(H)  Creatinine 0.60 - 0.93 mg/dL 0.74 0.76 0.77 - 0.73   BP/Weight  01/20/2017 07/10/2016 06/24/2016 05/14/2016 01/19/2016 09/18/2015 02/21/1592  Systolic BP 585 929 244 628 638 177 116  Diastolic BP 78 74 74 69 78 72 78  Wt. (Lbs) 279 281 279.04 280 276 270 267.12  BMI 41.2 41.5 41.21 41.35 40.76 39.87 39.43   Foot/eye exam completion dates Latest Ref Rng & Units 07/10/2016 02/20/2016  Eye Exam No Retinopathy - No Retinopathy  Foot exam Order - - -  Foot Form Completion - Done -      Updated lab needed at/ before next visit.   Obesity, Class III, BMI 40-49.9 (morbid obesity) ([place]) Slightly improved Patient re-educated about  the importance of commitment to a  minimum of 150 minutes of exercise per week.  The importance of healthy food choices with portion control discussed. Encouraged to start a food diary, count calories and to consider  joining a support group. Sample diet sheets offered. Goals set by the patient for the next several months.   Weight /BMI 01/20/2017 07/10/2016 06/24/2016  WEIGHT 279 lb 281 lb 279 lb 0.6 oz  HEIGHT 5\' 9"  5\' 9"  5\' 9"   BMI 41.2 kg/m2 41.5 kg/m2 41.21 kg/m2

## 2017-01-21 NOTE — Assessment & Plan Note (Signed)
Slightly improved Patient re-educated about  the importance of commitment to a  minimum of 150 minutes of exercise per week.  The importance of healthy food choices with portion control discussed. Encouraged to start a food diary, count calories and to consider  joining a support group. Sample diet sheets offered. Goals set by the patient for the next several months.   Weight /BMI 01/20/2017 07/10/2016 06/24/2016  WEIGHT 279 lb 281 lb 279 lb 0.6 oz  HEIGHT 5\' 9"  5\' 9"  5\' 9"   BMI 41.2 kg/m2 41.5 kg/m2 41.21 kg/m2

## 2017-02-24 DIAGNOSIS — Z01419 Encounter for gynecological examination (general) (routine) without abnormal findings: Secondary | ICD-10-CM | POA: Diagnosis not present

## 2017-02-24 DIAGNOSIS — D0581 Other specified type of carcinoma in situ of right breast: Secondary | ICD-10-CM | POA: Diagnosis not present

## 2017-02-24 DIAGNOSIS — R928 Other abnormal and inconclusive findings on diagnostic imaging of breast: Secondary | ICD-10-CM | POA: Diagnosis not present

## 2017-02-24 DIAGNOSIS — N909 Noninflammatory disorder of vulva and perineum, unspecified: Secondary | ICD-10-CM | POA: Diagnosis not present

## 2017-02-24 DIAGNOSIS — Z6841 Body Mass Index (BMI) 40.0 and over, adult: Secondary | ICD-10-CM | POA: Diagnosis not present

## 2017-02-24 DIAGNOSIS — N952 Postmenopausal atrophic vaginitis: Secondary | ICD-10-CM | POA: Diagnosis not present

## 2017-02-24 DIAGNOSIS — L732 Hidradenitis suppurativa: Secondary | ICD-10-CM | POA: Diagnosis not present

## 2017-02-24 DIAGNOSIS — D259 Leiomyoma of uterus, unspecified: Secondary | ICD-10-CM | POA: Diagnosis not present

## 2017-02-25 ENCOUNTER — Other Ambulatory Visit: Payer: Self-pay | Admitting: Obstetrics and Gynecology

## 2017-02-25 DIAGNOSIS — Z853 Personal history of malignant neoplasm of breast: Secondary | ICD-10-CM

## 2017-03-04 DIAGNOSIS — Z7984 Long term (current) use of oral hypoglycemic drugs: Secondary | ICD-10-CM | POA: Diagnosis not present

## 2017-03-04 DIAGNOSIS — H2513 Age-related nuclear cataract, bilateral: Secondary | ICD-10-CM | POA: Diagnosis not present

## 2017-03-04 DIAGNOSIS — H52203 Unspecified astigmatism, bilateral: Secondary | ICD-10-CM | POA: Diagnosis not present

## 2017-03-04 DIAGNOSIS — H524 Presbyopia: Secondary | ICD-10-CM | POA: Diagnosis not present

## 2017-03-04 DIAGNOSIS — H04123 Dry eye syndrome of bilateral lacrimal glands: Secondary | ICD-10-CM | POA: Diagnosis not present

## 2017-03-04 DIAGNOSIS — H5213 Myopia, bilateral: Secondary | ICD-10-CM | POA: Diagnosis not present

## 2017-03-04 DIAGNOSIS — E119 Type 2 diabetes mellitus without complications: Secondary | ICD-10-CM | POA: Diagnosis not present

## 2017-03-04 LAB — HM DIABETES EYE EXAM

## 2017-04-17 ENCOUNTER — Ambulatory Visit
Admission: RE | Admit: 2017-04-17 | Discharge: 2017-04-17 | Disposition: A | Discharge status: Home / Self Care | Payer: PPO | Source: Ambulatory Visit | Attending: Obstetrics and Gynecology | Admitting: Obstetrics and Gynecology

## 2017-04-17 DIAGNOSIS — Z853 Personal history of malignant neoplasm of breast: Secondary | ICD-10-CM

## 2017-04-17 DIAGNOSIS — R928 Other abnormal and inconclusive findings on diagnostic imaging of breast: Secondary | ICD-10-CM | POA: Diagnosis not present

## 2017-04-17 HISTORY — DX: Personal history of irradiation: Z92.3

## 2017-04-27 ENCOUNTER — Other Ambulatory Visit: Payer: Self-pay | Admitting: Family Medicine

## 2017-05-26 ENCOUNTER — Encounter: Payer: PPO | Admitting: Family Medicine

## 2017-06-01 IMAGING — US US TRANSVAGINAL NON-OB
1 series · 13 of 25 positions shown · non-contrast
Comparison: None

CLINICAL DATA: Uterine enlargement, vaginal bleeding on exam.

EXAM:
TRANSABDOMINAL AND TRANSVAGINAL ULTRASOUND OF PELVIS
TECHNIQUE: Both transabdominal and transvaginal ultrasound examinations of the
pelvis were performed. Transabdominal technique was performed for
global imaging of the pelvis including uterus, ovaries, adnexal
regions, and pelvic cul-de-sac. It was necessary to proceed with
endovaginal exam following the transabdominal exam to visualize the
uterus and ovaries.

[Series 1: us transvaginal non-ob · 0.20mm/px · 13 of 100 slices shown]
[im 1/100]
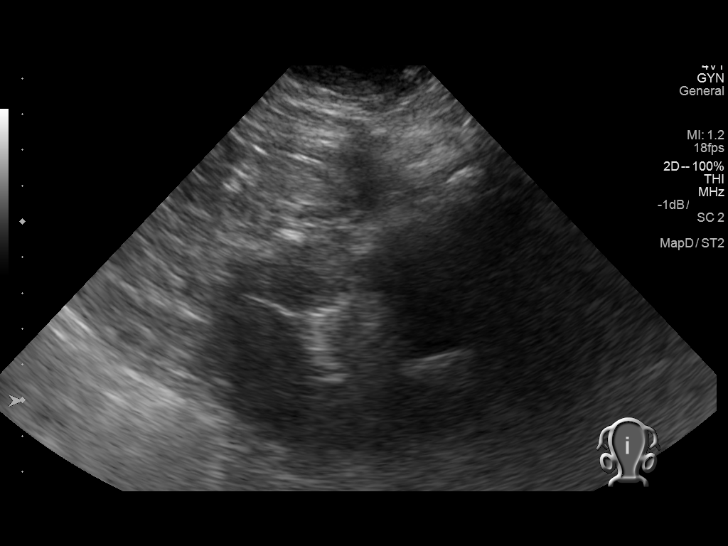
[im 9/100]
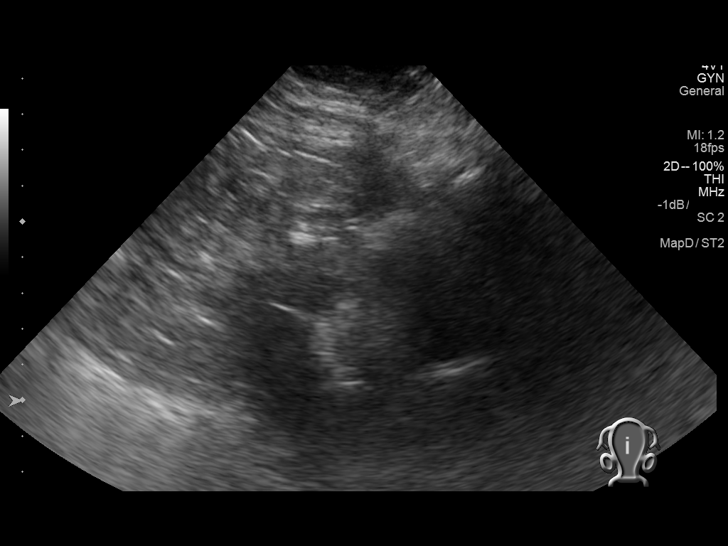
[im 17/100]
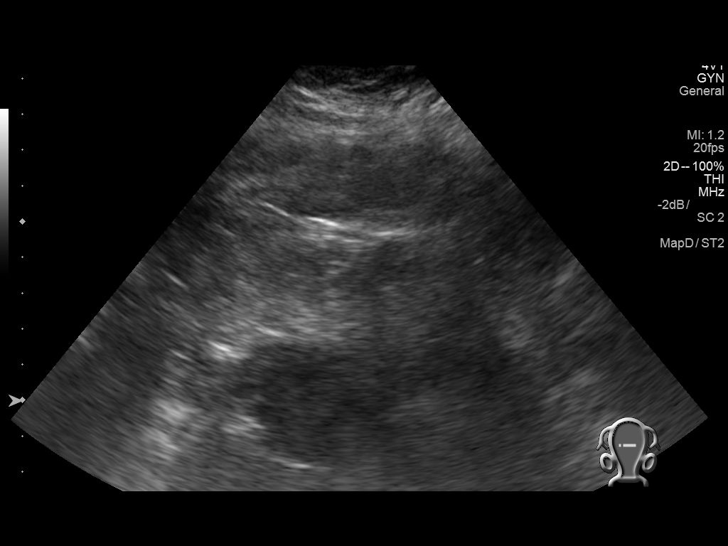
[im 25/100]
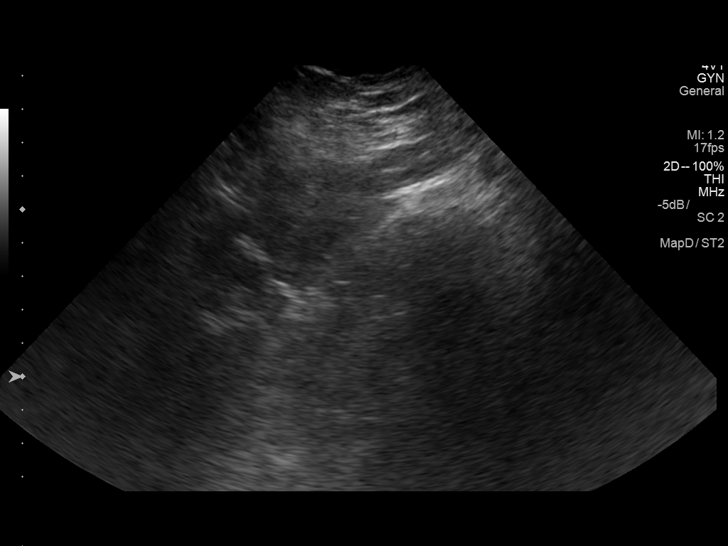
[im 34/100]
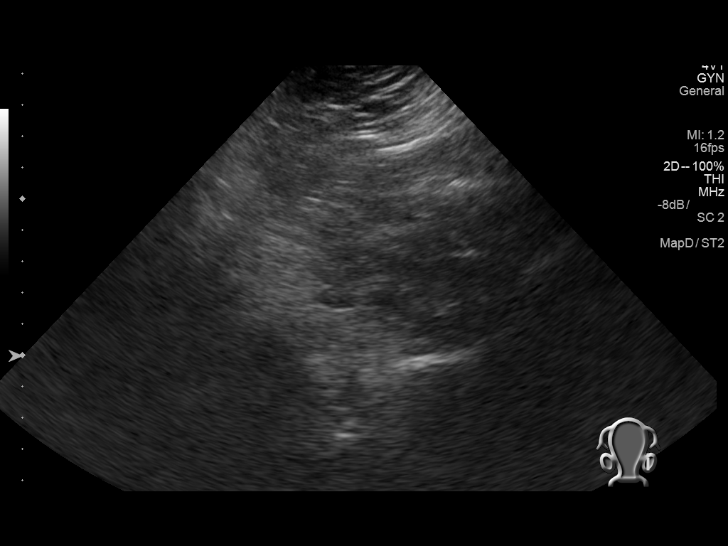
[im 42/100]
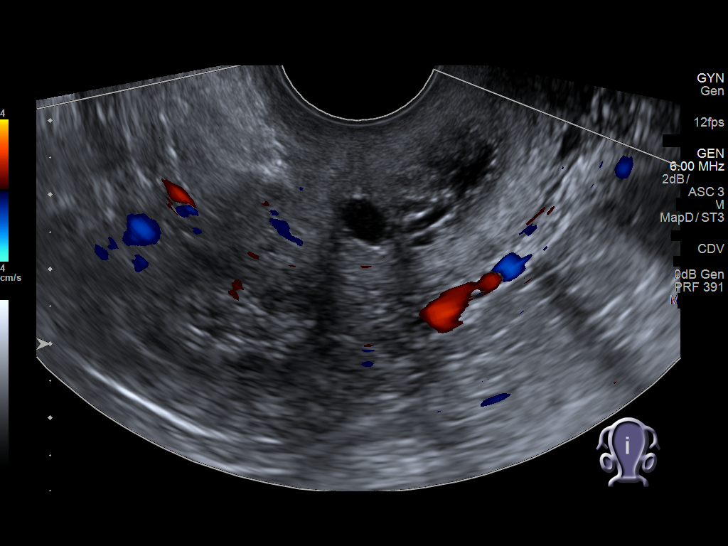
[im 50/100]
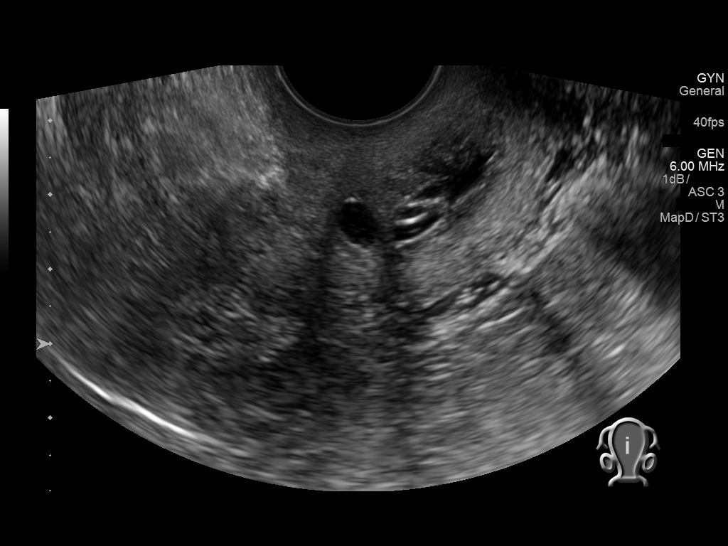
[im 58/100]
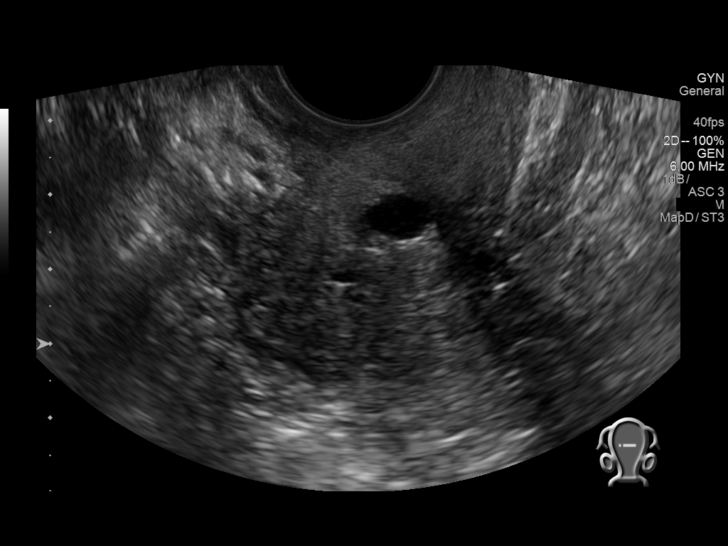
[im 67/100]
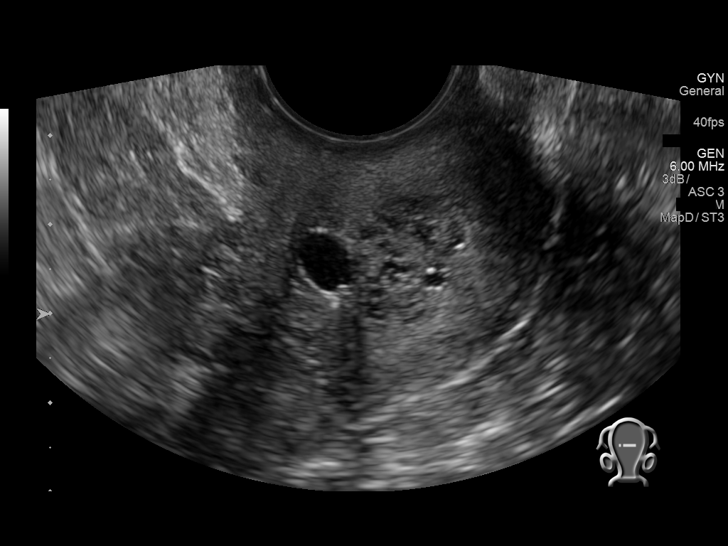
[im 75/100]
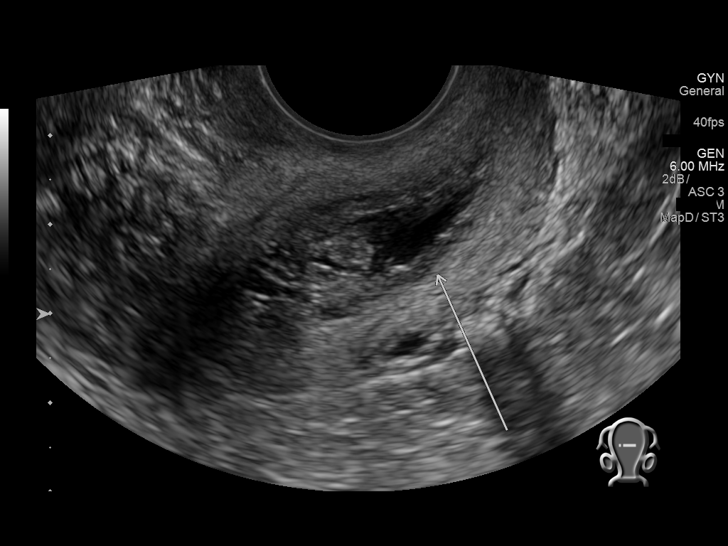
[im 83/100]
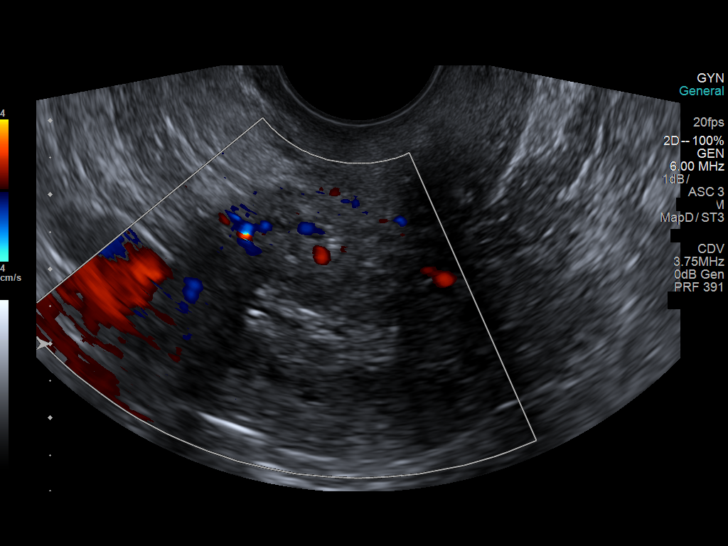
[im 91/100]
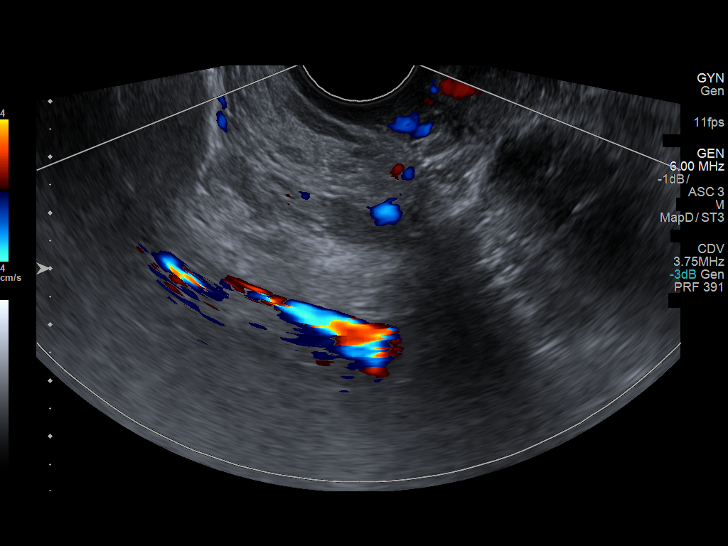
[im 100/100]
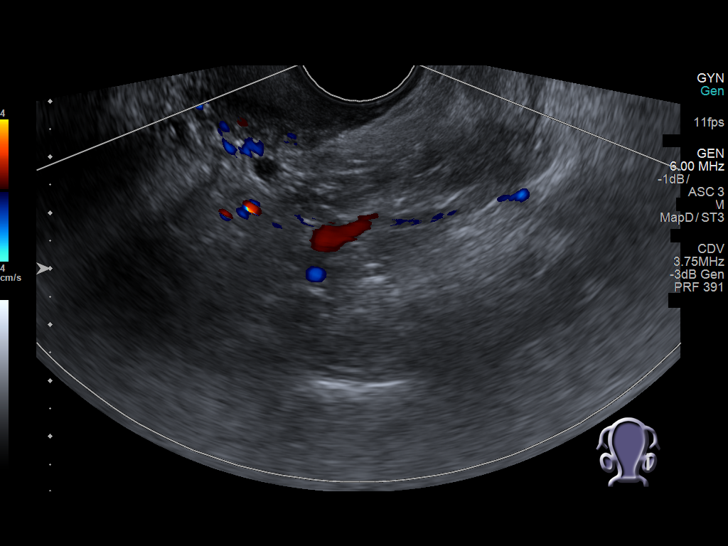

[13 of 25 positions shown; findings below may reference images not displayed]

FINDINGS: Uterus

Measurements: 7.9 x 3.2 x 4.5 cm. No fibroids or other mass
visualized. Nabothian cysts noted. Small amount of fluid noted
endometrial and cervical canal.

Endometrium

Thickness: 9.8 mm.  Endometrium appears heterogeneous and prominent.

Right ovary

Measurements: Not visualized.

Left ovary

Measurements: Not visualized.

Other findings

No abnormal free fluid. Limited exam due to bowel gas and body
habitus.
IMPRESSION: 1. Thickened heterogeneous endometrium with fluid in the endometrial
and cervical canal. In the setting of post-menopausal bleeding,
endometrial sampling is indicated to exclude carcinoma. If results
are benign, sonohysterogram should be considered for focal lesion
work-up. (Ref: Radiological Reasoning: Algorithmic Workup of
Abnormal Vaginal Bleeding with Endovaginal Sonography and
Sonohysterography. AJR 9226; 191:S68-73) .

2. Ovaries not visualized.

## 2017-06-20 DIAGNOSIS — E559 Vitamin D deficiency, unspecified: Secondary | ICD-10-CM | POA: Diagnosis not present

## 2017-06-20 DIAGNOSIS — E782 Mixed hyperlipidemia: Secondary | ICD-10-CM | POA: Diagnosis not present

## 2017-06-20 DIAGNOSIS — E1140 Type 2 diabetes mellitus with diabetic neuropathy, unspecified: Secondary | ICD-10-CM | POA: Diagnosis not present

## 2017-06-20 LAB — CBC
HEMATOCRIT: 36.9 % (ref 35.0–45.0)
HEMOGLOBIN: 12.2 g/dL (ref 11.7–15.5)
MCH: 26.7 pg — AB (ref 27.0–33.0)
MCHC: 33.1 g/dL (ref 32.0–36.0)
MCV: 80.7 fL (ref 80.0–100.0)
MPV: 10.9 fL (ref 7.5–12.5)
Platelets: 288 10*3/uL (ref 140–400)
RBC: 4.57 10*6/uL (ref 3.80–5.10)
RDW: 14.9 % (ref 11.0–15.0)
WBC: 5.9 10*3/uL (ref 3.8–10.8)

## 2017-06-20 LAB — TSH: TSH: 1.4 mIU/L (ref 0.40–4.50)

## 2017-06-20 LAB — COMPLETE METABOLIC PANEL WITH GFR
AG RATIO: 1.6 (calc) (ref 1.0–2.5)
ALT: 13 U/L (ref 6–29)
AST: 14 U/L (ref 10–35)
Albumin: 4.4 g/dL (ref 3.6–5.1)
Alkaline phosphatase (APISO): 58 U/L (ref 33–130)
BUN: 9 mg/dL (ref 7–25)
CALCIUM: 9.1 mg/dL (ref 8.6–10.4)
CO2: 30 mmol/L (ref 20–32)
CREATININE: 0.71 mg/dL (ref 0.60–0.93)
Chloride: 101 mmol/L (ref 98–110)
GFR, EST NON AFRICAN AMERICAN: 86 mL/min/{1.73_m2} (ref >=60)
GFR, Est African American: 99 mL/min/{1.73_m2} (ref >=60)
Globulin: 2.7 g/dL (calc) (ref 1.9–3.7)
Glucose, Bld: 145 mg/dL — ABNORMAL HIGH (ref 65–99)
POTASSIUM: 4.4 mmol/L (ref 3.5–5.3)
Sodium: 141 mmol/L (ref 135–146)
Total Bilirubin: 0.6 mg/dL (ref 0.2–1.2)
Total Protein: 7.1 g/dL (ref 6.1–8.1)

## 2017-06-20 LAB — LIPID PANEL
CHOL/HDL RATIO: 3.2 (calc) (ref <5.0)
Cholesterol: 141 mg/dL (ref <200)
HDL: 44 mg/dL — ABNORMAL LOW (ref >50)
LDL Cholesterol (Calc): 71 mg/dL (calc)
NON-HDL CHOLESTEROL (CALC): 97 mg/dL (ref <130)
Triglycerides: 184 mg/dL — ABNORMAL HIGH (ref <150)

## 2017-06-21 LAB — HEMOGLOBIN A1C
HEMOGLOBIN A1C: 7.5 %{Hb} — AB (ref <5.7)
Mean Plasma Glucose: 169 (calc)
eAG (mmol/L): 9.3 (calc)

## 2017-06-21 LAB — MICROALBUMIN / CREATININE URINE RATIO
CREATININE, URINE: 201 mg/dL (ref 20–275)
MICROALB UR: 3.6 mg/dL
Microalb Creat Ratio: 18 mcg/mg creat (ref <30)

## 2017-06-21 LAB — VITAMIN D 25 HYDROXY (VIT D DEFICIENCY, FRACTURES): Vit D, 25-Hydroxy: 38 ng/mL (ref 30–100)

## 2017-06-24 ENCOUNTER — Encounter: Payer: PPO | Admitting: Family Medicine

## 2017-07-31 ENCOUNTER — Encounter: Payer: Self-pay | Admitting: Family Medicine

## 2017-07-31 ENCOUNTER — Ambulatory Visit (INDEPENDENT_AMBULATORY_CARE_PROVIDER_SITE_OTHER): Payer: PPO | Admitting: Family Medicine

## 2017-07-31 VITALS — BP 128/72 | HR 93 | Resp 16 | Ht 69.0 in | Wt 274.0 lb

## 2017-07-31 DIAGNOSIS — D126 Benign neoplasm of colon, unspecified: Secondary | ICD-10-CM | POA: Diagnosis not present

## 2017-07-31 DIAGNOSIS — E1140 Type 2 diabetes mellitus with diabetic neuropathy, unspecified: Secondary | ICD-10-CM

## 2017-07-31 DIAGNOSIS — L732 Hidradenitis suppurativa: Secondary | ICD-10-CM | POA: Diagnosis not present

## 2017-07-31 DIAGNOSIS — Z0000 Encounter for general adult medical examination without abnormal findings: Secondary | ICD-10-CM

## 2017-07-31 MED ORDER — UNABLE TO FIND: 0 refills | Status: DC

## 2017-07-31 MED ORDER — DOXYCYCLINE HYCLATE 100 MG PO TABS: 100.0000 mg | ORAL_TABLET | Freq: Two times a day (BID) | ORAL | 0 refills | Status: DC

## 2017-07-31 NOTE — Patient Instructions (Addendum)
Please schedule wellness with nurse I=this ios past due  MD follow up end  November, call if you need me before  HBa1C, cmp and EGFR, and fasting lipid 1 week before follow up  Please work on reducing intake of sweets and starchy foods and commit to regular exercise so that you lose weight  Your foot exam qualifies you for diabetic shoes  medication is sent in for the skin infections for 10 days

## 2017-08-01 ENCOUNTER — Encounter: Payer: Self-pay | Admitting: Family Medicine

## 2017-08-01 DIAGNOSIS — D126 Benign neoplasm of colon, unspecified: Secondary | ICD-10-CM | POA: Insufficient documentation

## 2017-08-01 NOTE — Assessment & Plan Note (Signed)
Slightly improved. Patient re-educated about  the importance of commitment to a  minimum of 150 minutes of exercise per week.  The importance of healthy food choices with portion control discussed. Encouraged to start a food diary, count calories and to consider  joining a support group. Sample diet sheets offered. Goals set by the patient for the next several months.   Weight /BMI 07/31/2017 01/20/2017 07/10/2016  WEIGHT 274 lb 279 lb 281 lb  HEIGHT 5\' 9"  5\' 9"  5\' 9"   BMI 40.46 kg/m2 41.2 kg/m2 41.5 kg/m2

## 2017-08-01 NOTE — Progress Notes (Signed)
Theresa Adkins     MRN: [id number]      DOB: [birth year]  HPI: Patient is in for annual physical exam. C/o infected cysts in left axilla and in right groin, has chronic hydradenitis. States she was to return to gynecology re the inguinal cyst but chooses not to do so, and is refusing to let me examine this today also, states that it looks like the one in the axilla.  Recent labs, are reviewed. Immunization is reviewed    PE: BP 128/72   Pulse 93   Resp 16   Ht 5\' 9"  (1.753 m)   Wt 274 lb (124.3 kg)   SpO2 94%   BMI 40.46 kg/m   Pleasant  female, alert and oriented x 3, in no cardio-pulmonary distress. Afebrile. HEENT No facial trauma or asymetry. Sinuses non tender.  Extra occullar muscles intact,  External ears normal, tympanic membranes clear. Oropharynx moist, no exudate. Neck: supple, no adenopathy,JVD or thyromegaly.No bruits.  Chest: Clear to ascultation bilaterally.No crackles or wheezes. Non tender to palpation  Breast: Not examined, mammogram in 03/2017 is normal. Left axilla has infected sebaceous cyst, tender, no purulent drainage, right axilla, no infected cysts at this time, but multiple scars  Cardiovascular system; Heart sounds normal,  S1 and  S2 ,no S3.  No murmur, or thrill. Apical beat not displaced Peripheral pulses normal.  Abdomen: Soft, non tender, no organomegaly or masses. No bruits. Bowel sounds normal. No guarding, tenderness or rebound.  Rectal:  Not done Colonoscopy in January 2010  tubular adenoma, needs 3 stool card GU:  denies gyne complaint Reports right inguinal cyst but refuses examination   Musculoskeletal exam: Decreased  ROM of spine, hips , shoulders and knees. No deformity ,swelling or crepitus noted. No muscle wasting or atrophy.   Neurologic: Cranial nerves 2 to 12 intact. Power, tone ,sensation and reflexes normal throughout. No disturbance in gait. No tremor.  Skin: Infected cysts in left axilla and right  groin ( by history). Pigmentation normal throughout  Psych; Normal mood and affect. Judgement and concentration normal   Assessment & Plan:  Annual physical exam Annual exam as documented. Counseling done  re healthy lifestyle involving commitment to 150 minutes exercise per week, heart healthy diet, and attaining healthy weight.The importance of adequate sleep also discussed.  Changes in health habits are decided on by the patient with goals and time frames  set for achieving them. Immunization and cancer screening needs are specifically addressed at this visit.   Obesity, Class III, BMI 40-49.9 (morbid obesity) ([place]) Slightly improved. Patient re-educated about  the importance of commitment to a  minimum of 150 minutes of exercise per week.  The importance of healthy food choices with portion control discussed. Encouraged to start a food diary, count calories and to consider  joining a support group. Sample diet sheets offered. Goals set by the patient for the next several months.   Weight /BMI 07/31/2017 01/20/2017 07/10/2016  WEIGHT 274 lb 279 lb 281 lb  HEIGHT 5\' 9"  5\' 9"  5\' 9"   BMI 40.46 kg/m2 41.2 kg/m2 41.5 kg/m2      Controlled type 2 diabetes with neuropathy ([place]) Not as well, but still controled, no change in medication Ms. Jorstad is reminded of the importance of commitment to daily physical activity for 30 minutes or more, as able and the need to limit carbohydrate intake to 30 to 60 grams per meal to help with blood sugar control.   The need to  take medication as prescribed, test blood sugar as directed, and to call between visits if there is a concern that blood sugar is uncontrolled is also discussed.   Ms. Werntz is reminded of the importance of daily foot exam, annual eye examination, and good blood sugar, blood pressure and cholesterol control.  Diabetic Labs Latest Ref Rng & Units 06/20/2017 01/14/2017 07/04/2016 01/15/2016 09/18/2015  HbA1c <5.7 % of total Hgb 7.5(H)  7.4(H) 7.3(H) 7.0(H) -  Microalbumin mg/dL 3.6 - - - 9.4(H)  Micro/Creat Ratio <30 mcg/mg creat 18 - - - 7.4  Chol <200 mg/dL 141 167 150 155 -  HDL >50 mg/dL 44(L) 45(L) 45(L) 46(L) -  Calc LDL mg/dL (calc) 71 87 63 66 -  Triglycerides <150 mg/dL 184(H) 265(H) 208(H) 216(H) -  Creatinine 0.60 - 0.93 mg/dL 0.71 0.74 0.76 0.77 -   BP/Weight 07/31/2017 01/20/2017 07/10/2016 06/24/2016 05/14/2016 01/19/2016 5/91/6384  Systolic BP 665 993 570 177 939 030 092  Diastolic BP 72 78 74 74 69 78 72  Wt. (Lbs) 274 279 281 279.04 280 276 270  BMI 40.46 41.2 41.5 41.21 41.35 40.76 39.87   Foot/eye exam completion dates Latest Ref Rng & Units 07/31/2017 03/04/2017  Eye Exam No Retinopathy - No Retinopathy  Foot exam Order - - -  Foot Form Completion - Done -         HIDRADENITIS Infection in left axill and groin , 10 day antibiotic course prescribed

## 2017-08-01 NOTE — Assessment & Plan Note (Signed)
Annual exam as documented. Counseling done  re healthy lifestyle involving commitment to 150 minutes exercise per week, heart healthy diet, and attaining healthy weight.The importance of adequate sleep also discussed. Changes in health habits are decided on by the patient with goals and time frames  set for achieving them. Immunization and cancer screening needs are specifically addressed at this visit. 

## 2017-08-01 NOTE — Assessment & Plan Note (Signed)
Infection in left axill and groin , 10 day antibiotic course prescribed

## 2017-08-01 NOTE — Assessment & Plan Note (Signed)
Not as well, but still controled, no change in medication Theresa Adkins is reminded of the importance of commitment to daily physical activity for 30 minutes or more, as able and the need to limit carbohydrate intake to 30 to 60 grams per meal to help with blood sugar control.   The need to take medication as prescribed, test blood sugar as directed, and to call between visits if there is a concern that blood sugar is uncontrolled is also discussed.   Theresa Adkins is reminded of the importance of daily foot exam, annual eye examination, and good blood sugar, blood pressure and cholesterol control.  Diabetic Labs Latest Ref Rng & Units 06/20/2017 01/14/2017 07/04/2016 01/15/2016 09/18/2015  HbA1c <5.7 % of total Hgb 7.5(H) 7.4(H) 7.3(H) 7.0(H) -  Microalbumin mg/dL 3.6 - - - 9.4(H)  Micro/Creat Ratio <30 mcg/mg creat 18 - - - 7.4  Chol <200 mg/dL 141 167 150 155 -  HDL >50 mg/dL 44(L) 45(L) 45(L) 46(L) -  Calc LDL mg/dL (calc) 71 87 63 66 -  Triglycerides <150 mg/dL 184(H) 265(H) 208(H) 216(H) -  Creatinine 0.60 - 0.93 mg/dL 0.71 0.74 0.76 0.77 -   BP/Weight 07/31/2017 01/20/2017 07/10/2016 06/24/2016 05/14/2016 01/19/2016 1/51/7616  Systolic BP 073 710 626 948 546 270 350  Diastolic BP 72 78 74 74 69 78 72  Wt. (Lbs) 274 279 281 279.04 280 276 270  BMI 40.46 41.2 41.5 41.21 41.35 40.76 39.87   Foot/eye exam completion dates Latest Ref Rng & Units 07/31/2017 03/04/2017  Eye Exam No Retinopathy - No Retinopathy  Foot exam Order - - -  Foot Form Completion - Done -

## 2017-09-15 ENCOUNTER — Other Ambulatory Visit: Payer: Self-pay | Admitting: Family Medicine

## 2017-09-15 DIAGNOSIS — E785 Hyperlipidemia, unspecified: Secondary | ICD-10-CM

## 2017-09-16 ENCOUNTER — Telehealth: Payer: Self-pay | Admitting: Family Medicine

## 2017-09-16 NOTE — Telephone Encounter (Signed)
Needs lipitor refilled 

## 2017-09-16 NOTE — Telephone Encounter (Signed)
Left message that it has been refilled

## 2017-09-22 ENCOUNTER — Ambulatory Visit (INDEPENDENT_AMBULATORY_CARE_PROVIDER_SITE_OTHER): Payer: PPO

## 2017-09-22 VITALS — BP 136/70 | HR 83 | Resp 16 | Ht 69.0 in | Wt 276.0 lb

## 2017-09-22 DIAGNOSIS — Z0000 Encounter for general adult medical examination without abnormal findings: Secondary | ICD-10-CM

## 2017-09-22 NOTE — Patient Instructions (Addendum)
Theresa Adkins , Thank you for taking time to come for your Medicare Wellness Visit. I appreciate your ongoing commitment to your health goals. Please review the following plan we discussed and let me know if I can assist you in the future.   Next appt 01/01/18 8:00am    Screening recommendations/referrals: Colonoscopy: done  Mammogram: done  Bone Density: due but declined  Recommended yearly ophthalmology/optometry visit for glaucoma screening and checkup Recommended yearly dental visit for hygiene and checkup  Vaccinations: Influenza vaccine: flu clinic on fridays 8-12pm Pneumococcal vaccine: Done Tdap vaccine: done  Shingles vaccine: ask insurance if covered     Advanced directives: done   Conditions/risks identified: done   Next appointment: done    Preventive Care 34 Years and Older, Female Preventive care refers to lifestyle choices and visits with your health care provider that can promote health and wellness. What does preventive care include?  A yearly physical exam. This is also called an annual well check.  Dental exams once or twice a year.  Routine eye exams. Ask your health care provider how often you should have your eyes checked.  Personal lifestyle choices, including:  Daily care of your teeth and gums.  Regular physical activity.  Eating a healthy diet.  Avoiding tobacco and drug use.  Limiting alcohol use.  Practicing safe sex.  Taking low-dose aspirin every day.  Taking vitamin and mineral supplements as recommended by your health care provider. What happens during an annual well check? The services and screenings done by your health care provider during your annual well check will depend on your age, overall health, lifestyle risk factors, and family history of disease. Counseling  Your health care provider may ask you questions about your:  Alcohol use.  Tobacco use.  Drug use.  Emotional well-being.  Home and relationship  well-being.  Sexual activity.  Eating habits.  History of falls.  Memory and ability to understand (cognition).  Work and work Statistician.  Reproductive health. Screening  You may have the following tests or measurements:  Height, weight, and BMI.  Blood pressure.  Lipid and cholesterol levels. These may be checked every 5 years, or more frequently if you are over 41 years old.  Skin check.  Lung cancer screening. You may have this screening every year starting at age 68 if you have a 30-pack-year history of smoking and currently smoke or have quit within the past 15 years.  Fecal occult blood test (FOBT) of the stool. You may have this test every year starting at age 64.  Flexible sigmoidoscopy or colonoscopy. You may have a sigmoidoscopy every 5 years or a colonoscopy every 10 years starting at age 7.  Hepatitis C blood test.  Hepatitis B blood test.  Sexually transmitted disease (STD) testing.  Diabetes screening. This is done by checking your blood sugar (glucose) after you have not eaten for a while (fasting). You may have this done every 1-3 years.  Bone density scan. This is done to screen for osteoporosis. You may have this done starting at age 72.  Mammogram. This may be done every 1-2 years. Talk to your health care provider about how often you should have regular mammograms. Talk with your health care provider about your test results, treatment options, and if necessary, the need for more tests. Vaccines  Your health care provider may recommend certain vaccines, such as:  Influenza vaccine. This is recommended every year.  Tetanus, diphtheria, and acellular pertussis (Tdap, Td) vaccine. You may  need a Td booster every 10 years.  Zoster vaccine. You may need this after age 70.  Pneumococcal 13-valent conjugate (PCV13) vaccine. One dose is recommended after age 60.  Pneumococcal polysaccharide (PPSV23) vaccine. One dose is recommended after age  56. Talk to your health care provider about which screenings and vaccines you need and how often you need them. This information is not intended to replace advice given to you by your health care provider. Make sure you discuss any questions you have with your health care provider. Document Released: 03/03/2015 Document Revised: 10/25/2015 Document Reviewed: 12/06/2014 Elsevier Interactive Patient Education  2017 [place] Prevention in the Home Falls can cause injuries. They can happen to people of all ages. There are many things you can do to make your home safe and to help prevent falls. What can I do on the outside of my home?  Regularly fix the edges of walkways and driveways and fix any cracks.  Remove anything that might make you trip as you walk through a door, such as a raised step or threshold.  Trim any bushes or trees on the path to your home.  Use bright outdoor lighting.  Clear any walking paths of anything that might make someone trip, such as rocks or tools.  Regularly check to see if handrails are loose or broken. Make sure that both sides of any steps have handrails.  Any raised decks and porches should have guardrails on the edges.  Have any leaves, snow, or ice cleared regularly.  Use sand or salt on walking paths during winter.  Clean up any spills in your garage right away. This includes oil or grease spills. What can I do in the bathroom?  Use night lights.  Install grab bars by the toilet and in the tub and shower. Do not use towel bars as grab bars.  Use non-skid mats or decals in the tub or shower.  If you need to sit down in the shower, use a plastic, non-slip stool.  Keep the floor dry. Clean up any water that spills on the floor as soon as it happens.  Remove soap buildup in the tub or shower regularly.  Attach bath mats securely with double-sided non-slip rug tape.  Do not have throw rugs and other things on the floor that can make  you trip. What can I do in the bedroom?  Use night lights.  Make sure that you have a light by your bed that is easy to reach.  Do not use any sheets or blankets that are too big for your bed. They should not hang down onto the floor.  Have a firm chair that has side arms. You can use this for support while you get dressed.  Do not have throw rugs and other things on the floor that can make you trip. What can I do in the kitchen?  Clean up any spills right away.  Avoid walking on wet floors.  Keep items that you use a lot in easy-to-reach places.  If you need to reach something above you, use a strong step stool that has a grab bar.  Keep electrical cords out of the way.  Do not use floor polish or wax that makes floors slippery. If you must use wax, use non-skid floor wax.  Do not have throw rugs and other things on the floor that can make you trip. What can I do with my stairs?  Do not leave any items on  the stairs.  Make sure that there are handrails on both sides of the stairs and use them. Fix handrails that are broken or loose. Make sure that handrails are as long as the stairways.  Check any carpeting to make sure that it is firmly attached to the stairs. Fix any carpet that is loose or worn.  Avoid having throw rugs at the top or bottom of the stairs. If you do have throw rugs, attach them to the floor with carpet tape.  Make sure that you have a light switch at the top of the stairs and the bottom of the stairs. If you do not have them, ask someone to add them for you. What else can I do to help prevent falls?  Wear shoes that:  Do not have high heels.  Have rubber bottoms.  Are comfortable and fit you well.  Are closed at the toe. Do not wear sandals.  If you use a stepladder:  Make sure that it is fully opened. Do not climb a closed stepladder.  Make sure that both sides of the stepladder are locked into place.  Ask someone to hold it for you, if  possible.  Clearly mark and make sure that you can see:  Any grab bars or handrails.  First and last steps.  Where the edge of each step is.  Use tools that help you move around (mobility aids) if they are needed. These include:  Canes.  Walkers.  Scooters.  Crutches.  Turn on the lights when you go into a dark area. Replace any light bulbs as soon as they burn out.  Set up your furniture so you have a clear path. Avoid moving your furniture around.  If any of your floors are uneven, fix them.  If there are any pets around you, be aware of where they are.  Review your medicines with your doctor. Some medicines can make you feel dizzy. This can increase your chance of falling. Ask your doctor what other things that you can do to help prevent falls. This information is not intended to replace advice given to you by your health care provider. Make sure you discuss any questions you have with your health care provider. Document Released: 12/01/2008 Document Revised: 07/13/2015 Document Reviewed: 03/11/2014 Elsevier Interactive Patient Education  2017 [employer].

## 2017-09-22 NOTE — Progress Notes (Signed)
Subjective:   Theresa Adkins is a 72 y.o. female who presents for Medicare Annual (Subsequent) preventive examination.  Review of Systems:   Cardiac Risk Factors include: advanced age (>78men, >52 women);diabetes mellitus;dyslipidemia;hypertension;obesity (BMI >30kg/m2);smoking/ tobacco exposure     Objective:     Vitals: BP 136/70   Pulse 83   Resp 16   Ht 5\' 9"  (1.753 m)   Wt 276 lb (125.2 kg)   SpO2 95%   BMI 40.76 kg/m   Body mass index is 40.76 kg/m.  Advanced Directives 09/22/2017 06/24/2016 05/14/2016 08/09/2013 06/25/2013  Does Patient Have a Medical Advance Directive? No No Yes Patient does not have advance directive;Patient would not like information Patient does not have advance directive;Patient would not like information  Type of Advance Directive - - Living will;Healthcare Power of Attorney - -  Does patient want to make changes to medical advance directive? - - No - Patient declined - -  Copy of [place] in Chart? - - No - copy requested - -  Would patient like information on creating a medical advance directive? Yes (ED - Information included in AVS) No - Patient declined - - -    Tobacco Social History   Tobacco Use  Smoking Status Former Smoker  . Packs/day: 1.00  . Years: 25.00  . Pack years: 25.00  . Types: Cigarettes  . Last attempt to quit: 02/17/2002  . Years since quitting: 15.6  Smokeless Tobacco Never Used     Counseling given: Not Answered   Clinical Intake:  Pre-visit preparation completed: Yes  Pain : No/denies pain Pain Score: 0-No pain     Nutritional Status: BMI > 30  Obese Diabetes: Yes  How often do you need to have someone help you when you read instructions, pamphlets, or other written materials from your doctor or pharmacy?: 1 - Never What is the last grade level you completed in school?: college   Interpreter Needed?: No  Information entered by :: Jarone Ostergaard LPN   Past Medical History:  Diagnosis  Date  . Allergic rhinitis, seasonal   . Arthritis   . Breast cancer [facility]) dx 02/ 2015---  oncologist-  dr shadad/ dr Lisbeth Renshaw   dx Right breast upper-outer quadrant DCIS, high grade (Tis N0), ER+, PR negative--- 07-01-2013  s/p  right breast lumpectomy w/ sln bxs and radiation therapy (08-24-2013 to 09-20-2013)  . Diabetes mellitus type II   . Diabetic neuropathy ([place])   . Heart murmur   . Hidradenitis axillaris    chronic  . History of adenomatous polyp of colon    03-04-2008  tubular adenoma  . History of radiation therapy 08-24-2013 to 09-20-2013   total 50Gy  . Hyperlipidemia   . Hypertension   . Personal history of radiation therapy   . Thickened endometrium   . Wears dentures    upper  . Wears glasses    Past Surgical History:  Procedure Laterality Date  . BREAST BIOPSY Right 04/16/2016  . BREAST LUMPECTOMY Right 08/2013  . BREAST LUMPECTOMY WITH NEEDLE LOCALIZATION AND AXILLARY SENTINEL LYMPH NODE BX Right 07/01/2013   Procedure: BREAST LUMPECTOMY WITH NEEDLE LOCALIZATION AND AXILLARY SENTINEL LYMPH NODE BX;  Surgeon: Merrie Roof, MD;  Location: [place];  Service: General;  Laterality: Right;  . BREAST SURGERY  1970s   benign tumor removed from unspecified breast   . COLONOSCOPY W/ POLYPECTOMY  03/04/2008  . DILATATION & CURRETTAGE/HYSTEROSCOPY WITH RESECTOCOPE N/A 05/14/2016   Procedure:  [place];  Surgeon: Eldred Manges, MD;  Location: [facility];  Service: Gynecology;  Laterality: N/A;   Family History  Problem Relation Age of Onset  . Cancer Brother 65       bone   . Obesity Sister   . Heart disease Mother   . Cancer Sister 9       leukemia   . Obesity Sister    Social History   Socioeconomic History  . Marital status: Divorced    Spouse name: Not on file  . Number of children: 1  . Years of education: Not on file  . Highest education level: Not on file  Occupational History  . Occupation:  Employed   Scientific laboratory technician  . Financial resource strain: Not hard at all  . Food insecurity:    Worry: Never true    Inability: Never true  . Transportation needs:    Medical: No    Non-medical: No  Tobacco Use  . Smoking status: Former Smoker    Packs/day: 1.00    Years: 25.00    Pack years: 25.00    Types: Cigarettes    Last attempt to quit: 02/17/2002    Years since quitting: 15.6  . Smokeless tobacco: Never Used  Substance and Sexual Activity  . Alcohol use: No  . Drug use: No  . Sexual activity: Never  Lifestyle  . Physical activity:    Days per week: 0 days    Minutes per session: 0 min  . Stress: Not at all  Relationships  . Social connections:    Talks on phone: More than three times a week    Gets together: Three times a week    Attends religious service: Never    Active member of club or organization: No    Attends meetings of clubs or organizations: Never    Relationship status: Divorced  Other Topics Concern  . Not on file  Social History Narrative  . Not on file    Outpatient Encounter Medications as of 09/22/2017  Medication Sig  . amLODipine (NORVASC) 5 MG tablet Take 1 tablet (5 mg total) by mouth daily.  Marland Kitchen atorvastatin (LIPITOR) 10 MG tablet TAKE 1 TABLET BY MOUTH ONCE DAILY  . bisoprolol-hydrochlorothiazide (ZIAC) 10-6.25 MG tablet TAKE 1 TABLET BY MOUTH ONCE DAILY  . doxycycline (VIBRA-TABS) 100 MG tablet Take 1 tablet (100 mg total) by mouth 2 (two) times daily.  . metFORMIN (GLUCOPHAGE) 1000 MG tablet TAKE 1 TABLET BY MOUTH ONCE DAILY WITH A MEAL  . UNABLE TO FIND Diabetes shoes x 1 pair Inserts x 3 dx E11.9  . UNABLE TO FIND Diabetic shoes x 1  Inserts x 3 pair  Dx e11.9   No facility-administered encounter medications on file as of 09/22/2017.     Activities of Daily Living In your present state of health, do you have any difficulty performing the following activities: 09/22/2017  Hearing? N  Vision? N  Difficulty concentrating or making  decisions? N  Walking or climbing stairs? Y  Dressing or bathing? N  Doing errands, shopping? N  Preparing Food and eating ? N  Using the Toilet? N  In the past six months, have you accidently leaked urine? N  Do you have problems with loss of bowel control? N  Managing your Medications? N  Managing your Finances? N  Housekeeping or managing your Housekeeping? N  Some recent data might be hidden    Patient Care Team:  Fayrene Helper, MD as PCP - General Rothbart, Cristopher Estimable, MD (Cardiology) Jovita Kussmaul, MD as Consulting Physician (General Surgery) Rutherford Guys, MD as Consulting Physician (Ophthalmology)    Assessment:   This is a routine wellness examination for [place].  Exercise Activities and Dietary recommendations Current Exercise Habits: The patient does not participate in regular exercise at present  Goals    . Exercise 3x per week (30 min per time)     Recommend starting a routine exercise program at least 3 days a week for 30-45 minutes at a time as tolerated.         Fall Risk Fall Risk  09/22/2017 07/31/2017 06/24/2016 05/03/2015 09/01/2014  Falls in the past year? No No No No No   Is the patient's home free of loose throw rugs in walkways, pet beds, electrical cords, etc?   yes      Grab bars in the bathroom? yes      Handrails on the stairs?   yes      Adequate lighting?   yes  Timed Get Up and Go performed:   Depression Screen PHQ 2/9 Scores 09/22/2017 07/31/2017 07/31/2017 06/24/2016  PHQ - 2 Score 0 0 0 0  PHQ- 9 Score - - - -     Cognitive Function MMSE - Mini Mental State Exam 04/28/2014  Orientation to time 5  Orientation to Place 5  Registration 3  Attention/ Calculation 5  Recall 3  Language- name 2 objects 2  Language- repeat 1  Language- follow 3 step command 3  Language- read & follow direction 1  Write a sentence 1  Copy design 1  Total score 30     6CIT Screen 09/22/2017 06/24/2016  What Year? 0 points 0 points  What month? 0 points 0  points  What time? 0 points 0 points  Count back from 20 0 points 0 points  Months in reverse 0 points 0 points  Repeat phrase 0 points 0 points  Total Score 0 0    Immunization History  Administered Date(s) Administered  . Influenza Split 01/13/2012  . Influenza,inj,Quad PF,6+ Mos 11/13/2012, 11/25/2013, 11/01/2014, 12/21/2015, 12/03/2016  . Pneumococcal Conjugate-13 12/29/2014  . Pneumococcal Polysaccharide-23 01/19/2016  . Tdap 06/05/2010  . Zoster 06/05/2010    Qualifies for Shingles Vaccine? Ask insurance if covered   Screening Tests Health Maintenance  Topic Date Due  . INFLUENZA VACCINE  09/18/2017  . HEMOGLOBIN A1C  12/21/2017  . OPHTHALMOLOGY EXAM  03/04/2018  . FOOT EXAM  08/02/2018  . MAMMOGRAM  04/18/2019  . COLONOSCOPY  02/17/2020  . TETANUS/TDAP  06/04/2020  . DEXA SCAN  Completed  . Hepatitis C Screening  Completed  . PNA vac Low Risk Adult  Completed    Cancer Screenings: Lung: Low Dose CT Chest recommended if Age 26-80 years, 30 pack-year currently smoking OR have quit w/in 15years. Patient does not qualify. Breast:  Up to date on Mammogram? Yes   Up to date of Bone Density/Dexa? Yes Colorectal: done   Additional Screenings:  Hepatitis C Screening:      Plan:      I have personally reviewed and noted the following in the patient's chart:   . Medical and social history . Use of alcohol, tobacco or illicit drugs  . Current medications and supplements . Functional ability and status . Nutritional status . Physical activity . Advanced directives . List of other physicians . Hospitalizations, surgeries, and ER visits in previous 12 months .  Vitals . Screenings to include cognitive, depression, and falls . Referrals and appointments  In addition, I have reviewed and discussed with patient certain preventive protocols, quality metrics, and best practice recommendations. A written personalized care plan for preventive services as well as general  preventive health recommendations were provided to patient.     Kate Sable, LPN, LPN  06/18/8341

## 2017-10-01 ENCOUNTER — Other Ambulatory Visit: Payer: Self-pay | Admitting: Family Medicine

## 2017-10-03 ENCOUNTER — Telehealth: Payer: Self-pay | Admitting: Family Medicine

## 2017-10-03 ENCOUNTER — Other Ambulatory Visit: Payer: Self-pay

## 2017-10-03 MED ORDER — METFORMIN HCL 1000 MG PO TABS: ORAL_TABLET | ORAL | 1 refills | Status: DC

## 2017-10-03 MED ORDER — AMLODIPINE BESYLATE 5 MG PO TABS: 5.0000 mg | ORAL_TABLET | Freq: Every day | ORAL | 1 refills | Status: DC

## 2017-10-03 NOTE — Telephone Encounter (Signed)
Patient needs refill for metformin & amlodipine Says she sent in request Wednesday.  She doesn't have any more left. Please advise. Pharmacy is [employer]

## 2017-10-03 NOTE — Telephone Encounter (Signed)
meds sent

## 2017-10-23 ENCOUNTER — Other Ambulatory Visit: Payer: Self-pay | Admitting: Family Medicine

## 2017-11-25 DIAGNOSIS — E119 Type 2 diabetes mellitus without complications: Secondary | ICD-10-CM | POA: Diagnosis not present

## 2017-12-05 ENCOUNTER — Ambulatory Visit (INDEPENDENT_AMBULATORY_CARE_PROVIDER_SITE_OTHER): Payer: PPO

## 2017-12-05 DIAGNOSIS — Z23 Encounter for immunization: Secondary | ICD-10-CM

## 2017-12-26 ENCOUNTER — Telehealth: Payer: Self-pay

## 2017-12-26 DIAGNOSIS — E785 Hyperlipidemia, unspecified: Secondary | ICD-10-CM

## 2017-12-26 DIAGNOSIS — E1140 Type 2 diabetes mellitus with diabetic neuropathy, unspecified: Secondary | ICD-10-CM

## 2017-12-26 NOTE — Telephone Encounter (Signed)
Labs ordered.

## 2017-12-27 ENCOUNTER — Encounter: Payer: Self-pay | Admitting: Family Medicine

## 2017-12-27 LAB — COMPLETE METABOLIC PANEL WITH GFR
AG Ratio: 1.6 (calc) (ref 1.0–2.5)
ALT: 14 U/L (ref 6–29)
AST: 13 U/L (ref 10–35)
Albumin: 4.2 g/dL (ref 3.6–5.1)
Alkaline phosphatase (APISO): 56 U/L (ref 33–130)
BILIRUBIN TOTAL: 0.5 mg/dL (ref 0.2–1.2)
BUN: 14 mg/dL (ref 7–25)
CALCIUM: 9.2 mg/dL (ref 8.6–10.4)
CO2: 27 mmol/L (ref 20–32)
CREATININE: 0.76 mg/dL (ref 0.60–0.93)
Chloride: 101 mmol/L (ref 98–110)
GFR, EST NON AFRICAN AMERICAN: 78 mL/min/{1.73_m2} (ref >=60)
GFR, Est African American: 91 mL/min/{1.73_m2} (ref >=60)
GLUCOSE: 147 mg/dL — AB (ref 65–99)
Globulin: 2.7 g/dL (calc) (ref 1.9–3.7)
Potassium: 4.3 mmol/L (ref 3.5–5.3)
Sodium: 139 mmol/L (ref 135–146)
TOTAL PROTEIN: 6.9 g/dL (ref 6.1–8.1)

## 2017-12-27 LAB — LIPID PANEL
Cholesterol: 165 mg/dL (ref <200)
HDL: 44 mg/dL — ABNORMAL LOW (ref >50)
LDL Cholesterol (Calc): 85 mg/dL (calc)
NON-HDL CHOLESTEROL (CALC): 121 mg/dL (ref <130)
Total CHOL/HDL Ratio: 3.8 (calc) (ref <5.0)
Triglycerides: 275 mg/dL — ABNORMAL HIGH (ref <150)

## 2017-12-27 LAB — HEMOGLOBIN A1C
EAG (MMOL/L): 10 (calc)
Hgb A1c MFr Bld: 7.9 % of total Hgb — ABNORMAL HIGH (ref <5.7)
Mean Plasma Glucose: 180 (calc)

## 2018-01-01 ENCOUNTER — Encounter: Payer: Self-pay | Admitting: Family Medicine

## 2018-01-01 ENCOUNTER — Ambulatory Visit (INDEPENDENT_AMBULATORY_CARE_PROVIDER_SITE_OTHER): Payer: PPO | Admitting: Family Medicine

## 2018-01-01 VITALS — BP 124/76 | HR 79 | Resp 12 | Ht 69.0 in | Wt 285.1 lb

## 2018-01-01 DIAGNOSIS — E1140 Type 2 diabetes mellitus with diabetic neuropathy, unspecified: Secondary | ICD-10-CM | POA: Diagnosis not present

## 2018-01-01 DIAGNOSIS — I10 Essential (primary) hypertension: Secondary | ICD-10-CM

## 2018-01-01 DIAGNOSIS — E785 Hyperlipidemia, unspecified: Secondary | ICD-10-CM | POA: Diagnosis not present

## 2018-01-01 DIAGNOSIS — E1159 Type 2 diabetes mellitus with other circulatory complications: Secondary | ICD-10-CM

## 2018-01-01 NOTE — Patient Instructions (Addendum)
F/u in 14 weeks, cal`l if you need me before  You are referred for diabetic education , this will help  Please change food choice,TG are high and blood sugar has increased   Weight loss goal of 10 pounds  Fasting lipid, cmp and eGFr, hBA1C 1 week before next visit   It is important that you exercise regularly at least 30 minutes 5 times a week. If you develop chest pain, have severe difficulty breathing, or feel very tired, stop exercising immediately and seek medical attention    Thank you  for choosing [facility]. We consider it a privelige to serve you.  Delivering excellent health care in a caring and  compassionate way is our goal.  Partnering with you,  so that together we can achieve this goal is our strategy.

## 2018-01-01 NOTE — Assessment & Plan Note (Signed)
Hyperlipidemia:Low fat diet discussed and encouraged.   Lipid Panel  Lab Results  Component Value Date   CHOL 165 12/26/2017   HDL 44 (L) 12/26/2017   LDLCALC 85 12/26/2017   TRIG 275 (H) 12/26/2017   CHOLHDL 3.8 12/26/2017   needs to lower fat intake to reduce TG, and commit to regular exercise

## 2018-01-01 NOTE — Assessment & Plan Note (Signed)
Deteriorated. Increase metformin to twice daily Refer to diabetic educator Ms. Shimada is reminded of the importance of commitment to daily physical activity for 30 minutes or more, as able and the need to limit carbohydrate intake to 30 to 60 grams per meal to help with blood sugar control.   The need to take medication as prescribed, test blood sugar as directed, and to call between visits if there is a concern that blood sugar is uncontrolled is also discussed.   Ms. Dilley is reminded of the importance of daily foot exam, annual eye examination, and good blood sugar, blood pressure and cholesterol control.  Diabetic Labs Latest Ref Rng & Units 12/26/2017 06/20/2017 01/14/2017 07/04/2016 01/15/2016  HbA1c <5.7 % of total Hgb 7.9(H) 7.5(H) 7.4(H) 7.3(H) 7.0(H)  Microalbumin mg/dL - 3.6 - - -  Micro/Creat Ratio <30 mcg/mg creat - 18 - - -  Chol <200 mg/dL 165 141 167 150 155  HDL >50 mg/dL 44(L) 44(L) 45(L) 45(L) 46(L)  Calc LDL mg/dL (calc) 85 71 87 63 66  Triglycerides <150 mg/dL 275(H) 184(H) 265(H) 208(H) 216(H)  Creatinine 0.60 - 0.93 mg/dL 0.76 0.71 0.74 0.76 0.77   BP/Weight 01/01/2018 09/22/2017 07/31/2017 01/20/2017 07/10/2016 06/24/2016 2/56/3893  Systolic BP 734 287 681 157 262 035 597  Diastolic BP 76 70 72 78 74 74 69  Wt. (Lbs) 285.08 276 274 279 281 279.04 280  BMI 42.1 40.76 40.46 41.2 41.5 41.21 41.35   Foot/eye exam completion dates Latest Ref Rng & Units 07/31/2017 03/04/2017  Eye Exam No Retinopathy - No Retinopathy  Foot exam Order - - -  Foot Form Completion - Done -

## 2018-01-01 NOTE — Progress Notes (Signed)
Theresa Adkins     MRN: [id number]      DOB: 04-Sep-1945   HPI Theresa Adkins is here for follow up and re-evaluation of chronic medical conditions, medication management and review of any available recent lab and radiology data.  Preventive health is updated, specifically  Cancer screening and Immunization.   States hjer sister recently had a stent placed, she has no specific symptoms of heart disease buit she remains inactive most of the time The PT denies any adverse reactions to current medications since the last visit.  C/o ongoing weight gain, however admits to excessive intake of sugar and candy States thinking about bypass surgery Still has ulcer on labia majora, refuses gyne re eval, states this has been a problem " all my life" Denies polyuria, polydipsia, blurred vision , or hypoglycemic episodes.     ROS Denies recent fever or chills. Denies sinus pressure, nasal congestion, ear pain or sore throat. Denies chest congestion, productive cough or wheezing. Denies chest pains, palpitations and leg swelling Denies abdominal pain, nausea, vomiting,diarrhea or constipation.   Denies dysuria, frequency, hesitancy or incontinence. Denies joint pain, swelling and does have some  limitation in mobility. Denies headaches, seizures, numbness, or tingling. Denies depression, anxiety or insomnia.  PE  BP 124/76   Pulse 79   Resp 12   Ht 5\' 9"  (1.753 m)   Wt 285 lb 1.3 oz (129.3 kg)   SpO2 96% Comment: room air  BMI 42.10 kg/m   Patient alert and oriented and in no cardiopulmonary distress.  HEENT: No facial asymmetry, EOMI,   oropharynx pink and moist.  Neck supple no JVD, no mass.  Chest: Clear to auscultation bilaterally.  CVS: S1, S2 no murmurs, no S3.Regular rate.  ABD: Soft non tender.   Ext: No edema  MS: Adequate though reduced ROM spine, shoulders, hips and knees.  Skin: Intact, no ulcerations or rash noted.  Psych: Good eye contact, normal affect. Memory intact  not anxious or depressed appearing.  CNS: CN 2-12 intact, power,  normal throughout.no focal deficits noted.   Assessment & Plan  Hypertension associated with diabetes ([place]) Controlled, no change in medication DASH diet and commitment to daily physical activity for a minimum of 30 minutes discussed and encouraged, as a part of hypertension management. The importance of attaining a healthy weight is also discussed.  BP/Weight 01/01/2018 09/22/2017 07/31/2017 01/20/2017 07/10/2016 06/24/2016 2/29/7989  Systolic BP 211 941 740 814 481 856 314  Diastolic BP 76 70 72 78 74 74 69  Wt. (Lbs) 285.08 276 274 279 281 279.04 280  BMI 42.1 40.76 40.46 41.2 41.5 41.21 41.35       Controlled type 2 diabetes with neuropathy ([facility]) Deteriorated. Increase metformin to twice daily Refer to diabetic educator Theresa Adkins is reminded of the importance of commitment to daily physical activity for 30 minutes or more, as able and the need to limit carbohydrate intake to 30 to 60 grams per meal to help with blood sugar control.   The need to take medication as prescribed, test blood sugar as directed, and to call between visits if there is a concern that blood sugar is uncontrolled is also discussed.   Theresa Adkins is reminded of the importance of daily foot exam, annual eye examination, and good blood sugar, blood pressure and cholesterol control.  Diabetic Labs Latest Ref Rng & Units 12/26/2017 06/20/2017 01/14/2017 07/04/2016 01/15/2016  HbA1c <5.7 % of total Hgb 7.9(H) 7.5(H) 7.4(H) 7.3(H) 7.0(H)  Microalbumin  mg/dL - 3.6 - - -  Micro/Creat Ratio <30 mcg/mg creat - 18 - - -  Chol <200 mg/dL 165 141 167 150 155  HDL >50 mg/dL 44(L) 44(L) 45(L) 45(L) 46(L)  Calc LDL mg/dL (calc) 85 71 87 63 66  Triglycerides <150 mg/dL 275(H) 184(H) 265(H) 208(H) 216(H)  Creatinine 0.60 - 0.93 mg/dL 0.76 0.71 0.74 0.76 0.77   BP/Weight 01/01/2018 09/22/2017 07/31/2017 01/20/2017 07/10/2016 06/24/2016 1/61/0960  Systolic BP 454 098 119 147  829 562 130  Diastolic BP 76 70 72 78 74 74 69  Wt. (Lbs) 285.08 276 274 279 281 279.04 280  BMI 42.1 40.76 40.46 41.2 41.5 41.21 41.35   Foot/eye exam completion dates Latest Ref Rng & Units 07/31/2017 03/04/2017  Eye Exam No Retinopathy - No Retinopathy  Foot exam Order - - -  Foot Form Completion - Done -        Obesity, Class III, BMI 40-49.9 (morbid obesity) ([place]) Obesity linked with hypwertension, diabetes and hyperlipidemia. Deteriorated. Patient re-educated about  the importance of commitment to a  minimum of 150 minutes of exercise per week.  The importance of healthy food choices with portion control discussed. Encouraged to start a food diary, count calories and to consider  joining a support group. Sample diet sheets offered. Goals set by the patient for the next several months.   Weight /BMI 01/01/2018 09/22/2017 07/31/2017  WEIGHT 285 lb 1.3 oz 276 lb 274 lb  HEIGHT 5\' 9"  5\' 9"  5\' 9"   BMI 42.1 kg/m2 40.76 kg/m2 40.46 kg/m2      Hyperlipidemia Hyperlipidemia:Low fat diet discussed and encouraged.   Lipid Panel  Lab Results  Component Value Date   CHOL 165 12/26/2017   HDL 44 (L) 12/26/2017   LDLCALC 85 12/26/2017   TRIG 275 (H) 12/26/2017   CHOLHDL 3.8 12/26/2017   needs to lower fat intake to reduce TG, and commit to regular exercise

## 2018-01-01 NOTE — Assessment & Plan Note (Signed)
Controlled, no change in medication DASH diet and commitment to daily physical activity for a minimum of 30 minutes discussed and encouraged, as a part of hypertension management. The importance of attaining a healthy weight is also discussed.  BP/Weight 01/01/2018 09/22/2017 07/31/2017 01/20/2017 07/10/2016 06/24/2016 09/08/8286  Systolic BP 337 445 146 047 998 721 587  Diastolic BP 76 70 72 78 74 74 69  Wt. (Lbs) 285.08 276 274 279 281 279.04 280  BMI 42.1 40.76 40.46 41.2 41.5 41.21 41.35

## 2018-01-01 NOTE — Assessment & Plan Note (Signed)
Obesity linked with hypwertension, diabetes and hyperlipidemia. Deteriorated. Patient re-educated about  the importance of commitment to a  minimum of 150 minutes of exercise per week.  The importance of healthy food choices with portion control discussed. Encouraged to start a food diary, count calories and to consider  joining a support group. Sample diet sheets offered. Goals set by the patient for the next several months.   Weight /BMI 01/01/2018 09/22/2017 07/31/2017  WEIGHT 285 lb 1.3 oz 276 lb 274 lb  HEIGHT 5\' 9"  5\' 9"  5\' 9"   BMI 42.1 kg/m2 40.76 kg/m2 40.46 kg/m2

## 2018-01-08 ENCOUNTER — Encounter: Payer: PPO | Attending: Family Medicine | Admitting: Nutrition

## 2018-01-08 VITALS — Ht 69.0 in | Wt 286.0 lb

## 2018-01-08 DIAGNOSIS — E1140 Type 2 diabetes mellitus with diabetic neuropathy, unspecified: Secondary | ICD-10-CM | POA: Insufficient documentation

## 2018-01-08 DIAGNOSIS — Z6841 Body Mass Index (BMI) 40.0 and over, adult: Secondary | ICD-10-CM

## 2018-01-08 DIAGNOSIS — E1165 Type 2 diabetes mellitus with hyperglycemia: Secondary | ICD-10-CM

## 2018-01-08 DIAGNOSIS — Z713 Dietary counseling and surveillance: Secondary | ICD-10-CM | POA: Diagnosis not present

## 2018-01-08 DIAGNOSIS — E118 Type 2 diabetes mellitus with unspecified complications: Secondary | ICD-10-CM

## 2018-01-08 DIAGNOSIS — IMO0002 Reserved for concepts with insufficient information to code with codable children: Secondary | ICD-10-CM

## 2018-01-08 NOTE — Progress Notes (Signed)
Medical Nutrition Therapy:  Appt start time: 5102 end time:  1630.   Assessment:  Primary concerns today: DIabetes Type 2,  Morbid Obesity, HTN and Hyperlipidemia.. Lives with her son. Eats 2-3 meals per day. Eats misc foods thoughout the day. Skips lunch or breakfast most days. Metformin 1 per day of 500 mg.  Not testing blood sugars. A1C 7.9%.   Current diet exceeds her needs as evidenced by A1C of 7.9% and BMI of 42. Wants to lose weight and improve her DM and doesn't want to take more medications. Willing to start testing her blood sugars.   Wt Readings from Last 3 Encounters:  01/08/18 286 lb (129.7 kg)  01/01/18 285 lb 1.3 oz (129.3 kg)  09/22/17 276 lb (125.2 kg)   Ht Readings from Last 3 Encounters:  01/08/18 5\' 9"  (1.753 m)  01/01/18 5\' 9"  (1.753 m)  09/22/17 5\' 9"  (1.753 m)   Body mass index is 42.23 kg/m. @BMIFA @ Facility age limit for growth percentiles is 20 years. Facility age limit for growth percentiles is 20 years. CMP Latest Ref Rng & Units 12/26/2017 06/20/2017 01/14/2017  Glucose 65 - 99 mg/dL 147(H) 145(H) 127(H)  BUN 7 - 25 mg/dL 14 9 7   Creatinine 0.60 - 0.93 mg/dL 0.76 0.71 0.74  Sodium 135 - 146 mmol/L 139 141 139  Potassium 3.5 - 5.3 mmol/L 4.3 4.4 4.4  Chloride 98 - 110 mmol/L 101 101 99  CO2 20 - 32 mmol/L 27 30 30   Calcium 8.6 - 10.4 mg/dL 9.2 9.1 9.3  Total Protein 6.1 - 8.1 g/dL 6.9 7.1 7.2  Total Bilirubin 0.2 - 1.2 mg/dL 0.5 0.6 0.6  Alkaline Phos 33 - 130 U/L - - -  AST 10 - 35 U/L 13 14 12   ALT 6 - 29 U/L 14 13 10    Lipid Panel     Component Value Date/Time   CHOL 165 12/26/2017 0956   CHOL 172 12/23/2014   TRIG 275 (H) 12/26/2017 0956   HDL 44 (L) 12/26/2017 0956   CHOLHDL 3.8 12/26/2017 0956   VLDL 42 (H) 07/04/2016 0820   LDLCALC 85 12/26/2017 0956    Preferred Learning Style:on  No preference indicated   Learning Readiness:   Ready  Change in progress   MEDICATIONS:    DIETARY INTAKE:  24-hr recall:  B ( AM):  cornflakes and banana, water, crystal light ,   Snk ( AM): cookies  L ( PM): green peas, baked potato, cherrios honey nut dry 2 cups Snk ( PM): fruit, grapes D ( PM):misc  Beverages: Crystal light,   Usual physical activity: ADL   Estimated energy needs: 1200  calories 135 g carbohydrates 90 g protein 33 g fat  Progress Towards Goal(s):  In progress.   Nutritional Diagnosis:  NB-1.1 Food and nutrition-related knowledge deficit As related to Dm Type 2, .  As evidenced by A1C 7.9%.    Intervention:  Nutrition and Diabetes education provided on My Plate, CHO counting, meal planning, portion sizes, timing of meals, avoiding snacks between meals unless having a low blood sugar, target ranges for A1C and blood sugars, signs/symptoms and treatment of hyper/hypoglycemia, monitoring blood sugars, taking medications as prescribed, benefits of exercising 30 minutes per day and prevention of complications of DM.  Goals 1. Follow My Plate 2. Cut out sweetts 3. Cut down on breakfast 4. Increase fresh fruits and vegetables.  Walk 15 minutes a day Lose 2-3 lbs per month  Teaching Method Utilized:  Visual Auditory  Hands on  Handouts given during visit include:  The Plate Method   Meal Plan Card   Barriers to learning/adherence to lifestyle change: none  Demonstrated degree of understanding via:  Teach Back   Monitoring/Evaluation:  Dietary intake, exercise, , and body weight in 1 month(s).

## 2018-01-08 NOTE — Patient Instructions (Addendum)
Goals 1. Follow My Plate 2. Cut out sweetts 3. Cut down on breakfast 4. Increase fresh fruits and vegetables.  Walk 15 minutes a day Lose 2-3 lbs per month

## 2018-01-12 ENCOUNTER — Encounter: Payer: Self-pay | Admitting: Nutrition

## 2018-01-21 ENCOUNTER — Encounter: Payer: Self-pay | Admitting: Gastroenterology

## 2018-03-03 ENCOUNTER — Ambulatory Visit: Payer: PPO | Admitting: Nutrition

## 2018-03-05 ENCOUNTER — Other Ambulatory Visit: Payer: Self-pay | Admitting: Family Medicine

## 2018-03-05 DIAGNOSIS — Z853 Personal history of malignant neoplasm of breast: Secondary | ICD-10-CM

## 2018-03-16 ENCOUNTER — Other Ambulatory Visit: Payer: Self-pay | Admitting: Family Medicine

## 2018-03-16 DIAGNOSIS — E785 Hyperlipidemia, unspecified: Secondary | ICD-10-CM

## 2018-04-03 DIAGNOSIS — E785 Hyperlipidemia, unspecified: Secondary | ICD-10-CM | POA: Diagnosis not present

## 2018-04-03 DIAGNOSIS — E1140 Type 2 diabetes mellitus with diabetic neuropathy, unspecified: Secondary | ICD-10-CM | POA: Diagnosis not present

## 2018-04-03 DIAGNOSIS — I10 Essential (primary) hypertension: Secondary | ICD-10-CM | POA: Diagnosis not present

## 2018-04-04 LAB — LIPID PANEL
Cholesterol: 150 mg/dL (ref <200)
HDL: 40 mg/dL — ABNORMAL LOW (ref >=50)
LDL Cholesterol (Calc): 70 mg/dL (calc)
Non-HDL Cholesterol (Calc): 110 mg/dL (calc) (ref <130)
TRIGLYCERIDES: 331 mg/dL — AB (ref <150)
Total CHOL/HDL Ratio: 3.8 (calc) (ref <5.0)

## 2018-04-04 LAB — COMPLETE METABOLIC PANEL WITH GFR
AG Ratio: 1.6 (calc) (ref 1.0–2.5)
ALBUMIN MSPROF: 4.5 g/dL (ref 3.6–5.1)
ALT: 12 U/L (ref 6–29)
AST: 13 U/L (ref 10–35)
Alkaline phosphatase (APISO): 63 U/L (ref 37–153)
BILIRUBIN TOTAL: 0.4 mg/dL (ref 0.2–1.2)
BUN: 10 mg/dL (ref 7–25)
CALCIUM: 9.5 mg/dL (ref 8.6–10.4)
CO2: 24 mmol/L (ref 20–32)
Chloride: 101 mmol/L (ref 98–110)
Creat: 0.83 mg/dL (ref 0.60–0.93)
GFR, EST AFRICAN AMERICAN: 82 mL/min/{1.73_m2} (ref >=60)
GFR, EST NON AFRICAN AMERICAN: 70 mL/min/{1.73_m2} (ref >=60)
GLUCOSE: 136 mg/dL — AB (ref 65–99)
Globulin: 2.8 g/dL (calc) (ref 1.9–3.7)
Potassium: 4.4 mmol/L (ref 3.5–5.3)
Sodium: 140 mmol/L (ref 135–146)
TOTAL PROTEIN: 7.3 g/dL (ref 6.1–8.1)

## 2018-04-04 LAB — HEMOGLOBIN A1C
Hgb A1c MFr Bld: 7.3 % of total Hgb — ABNORMAL HIGH (ref <5.7)
Mean Plasma Glucose: 163 (calc)
eAG (mmol/L): 9 (calc)

## 2018-04-09 ENCOUNTER — Ambulatory Visit (INDEPENDENT_AMBULATORY_CARE_PROVIDER_SITE_OTHER): Payer: PPO | Admitting: Family Medicine

## 2018-04-09 ENCOUNTER — Encounter: Payer: Self-pay | Admitting: Family Medicine

## 2018-04-09 VITALS — BP 120/78 | HR 83 | Resp 15 | Ht 69.0 in | Wt 270.0 lb

## 2018-04-09 DIAGNOSIS — E785 Hyperlipidemia, unspecified: Secondary | ICD-10-CM | POA: Diagnosis not present

## 2018-04-09 DIAGNOSIS — I10 Essential (primary) hypertension: Secondary | ICD-10-CM

## 2018-04-09 DIAGNOSIS — E1140 Type 2 diabetes mellitus with diabetic neuropathy, unspecified: Secondary | ICD-10-CM

## 2018-04-09 DIAGNOSIS — E1159 Type 2 diabetes mellitus with other circulatory complications: Secondary | ICD-10-CM

## 2018-04-09 DIAGNOSIS — E559 Vitamin D deficiency, unspecified: Secondary | ICD-10-CM

## 2018-04-09 NOTE — Patient Instructions (Addendum)
F/U in 4 months, call if you need me before  Please call and schedule eye appts and colonoscopy after your mammmogram  CONGRATULATIONS on iMPROVED heal;th  Aim for 16 to pounds more off  Reduce egg yolks and nuts  Eat  3 meals per day  Start exercise aim for 150 minutes per week and stretches     Think about what you will eat, plan ahead. Choose " clean, green, fresh or frozen" over canned, processed or packaged foods which are more sugary, salty and fatty. 70 to 75% of food eaten should be vegetables and fruit. Three meals at set times with snacks allowed between meals, but they must be fruit or vegetables. Aim to eat over a 12 hour period , example 7 am to 7 pm, and STOP after  your last meal of the day. Drink water,generally about 64 ounces per day, no other drink is as healthy. Fruit juice is best enjoyed in a healthy way, by EATING the fruit.   Thank you  for choosing  Primary Care. We consider it a privelige to serve you.  Delivering excellent health care in a caring and  compassionate way is our goal.  Partnering with you,  so that together we can achieve this goal is our strategy.

## 2018-04-11 ENCOUNTER — Encounter: Payer: Self-pay | Admitting: Family Medicine

## 2018-04-11 NOTE — Assessment & Plan Note (Signed)
Controlled, no change in medication DASH diet and commitment to daily physical activity for a minimum of 30 minutes discussed and encouraged, as a part of hypertension management. The importance of attaining a healthy weight is also discussed.  BP/Weight 04/09/2018 01/08/2018 01/01/2018 09/22/2017 07/31/2017 01/20/2017 08/09/2977  Systolic BP 892 - 119 417 408 144 818  Diastolic BP 78 - 76 70 72 78 74  Wt. (Lbs) 270 286 285.08 276 274 279 281  BMI 39.87 42.23 42.1 40.76 40.46 41.2 41.5

## 2018-04-11 NOTE — Assessment & Plan Note (Signed)
Obesity linked with hypertension and diabetes Greatly improved Patient re-educated about  the importance of commitment to a  minimum of 150 minutes of exercise per week as able.  The importance of healthy food choices with portion control discussed, as well as eating regularly and within a 12 hour window most days. The need to choose "clean , green" food 50 to 75% of the time is discussed, as well as to make water the primary drink and set a goal of 64 ounces water daily.  Encouraged to start a food diary,  and to consider  joining a support group. Sample diet sheets offered. Goals set by the patient for the next several months.   Weight /BMI 04/09/2018 01/08/2018 01/01/2018  WEIGHT 270 lb 286 lb 285 lb 1.3 oz  HEIGHT 5\' 9"  5\' 9"  5\' 9"   BMI 39.87 kg/m2 42.23 kg/m2 42.1 kg/m2

## 2018-04-11 NOTE — Progress Notes (Signed)
ANNSLEY Adkins     MRN: [id number]      DOB: 10/15/1945   HPI [name] is here for follow up and re-evaluation of chronic medical conditions, medication management and review of any available recent lab and radiology data.  Preventive health is updated, specifically  Cancer screening and Immunization.   Questions or concerns regarding consultations or procedures which the PT has had in the interim are  addressed. The PT denies any adverse reactions to current medications since the last visit.  There are no new concerns.  There are no specific complaints   ROS Denies recent fever or chills. Denies sinus pressure, nasal congestion, ear pain or sore throat. Denies chest congestion, productive cough or wheezing. Denies chest pains, palpitations and leg swelling Denies abdominal pain, nausea, vomiting,diarrhea or constipation.   Denies dysuria, frequency, hesitancy or incontinence. Denies joint pain, swelling and limitation in mobility. Denies headaches, seizures, numbness, or tingling. Denies depression, anxiety or insomnia. Denies skin break down or rash.   PE  BP 120/78   Pulse 83   Resp 15   Ht 5\' 9"  (1.753 m)   Wt 270 lb (122.5 kg)   SpO2 99%   BMI 39.87 kg/m   Patient alert and oriented and in no cardiopulmonary distress.  HEENT: No facial asymmetry, EOMI,   oropharynx pink and moist.  Neck supple no JVD, no mass.  Chest: Clear to auscultation bilaterally.  CVS: S1, S2 no murmurs, no S3.Regular rate.  ABD: Soft non tender.   Ext: No edema  MS: Adequate ROM spine, shoulders, hips and knees.  Skin: Intact, no ulcerations or rash noted.  Psych: Good eye contact, normal affect. Memory intact not anxious or depressed appearing.  CNS: CN 2-12 intact, power,  normal throughout.no focal deficits noted.   Assessment & Plan  Hypertension associated with diabetes ([place]) Controlled, no change in medication DASH diet and commitment to daily physical activity for a  minimum of 30 minutes discussed and encouraged, as a part of hypertension management. The importance of attaining a healthy weight is also discussed.  BP/Weight 04/09/2018 01/08/2018 01/01/2018 09/22/2017 07/31/2017 01/20/2017 8/93/7342  Systolic BP 876 - 811 572 620 355 974  Diastolic BP 78 - 76 70 72 78 74  Wt. (Lbs) 270 286 285.08 276 274 279 281  BMI 39.87 42.23 42.1 40.76 40.46 41.2 41.5       Hyperlipidemia Hyperlipidemia:Low fat diet discussed and encouraged.   Lipid Panel  Lab Results  Component Value Date   CHOL 150 04/03/2018   HDL 40 (L) 04/03/2018   LDLCALC 70 04/03/2018   TRIG 331 (H) 04/03/2018   CHOLHDL 3.8 04/03/2018   Needs to reduce nuts and cheese, TG very elevated    Morbid obesity ([place]) Obesity linked with hypertension and diabetes Greatly improved Patient re-educated about  the importance of commitment to a  minimum of 150 minutes of exercise per week as able.  The importance of healthy food choices with portion control discussed, as well as eating regularly and within a 12 hour window most days. The need to choose "clean , green" food 50 to 75% of the time is discussed, as well as to make water the primary drink and set a goal of 64 ounces water daily.  Encouraged to start a food diary,  and to consider  joining a support group. Sample diet sheets offered. Goals set by the patient for the next several months.   Weight /BMI 04/09/2018 01/08/2018 01/01/2018  WEIGHT  270 lb 286 lb 285 lb 1.3 oz  HEIGHT 5\' 9"  5\' 9"  5\' 9"   BMI 39.87 kg/m2 42.23 kg/m2 42.1 kg/m2      Controlled type 2 diabetes with neuropathy [facility]) Theresa Adkins is reminded of the importance of commitment to daily physical activity for 30 minutes or more, as able and the need to limit carbohydrate intake to 30 to 60 grams per meal to help with blood sugar control.   The need to take medication as prescribed, test blood sugar as directed, and to call between visits if there is a concern that  blood sugar is uncontrolled is also discussed.   Theresa Adkins is reminded of the importance of daily foot exam, annual eye examination, and good blood sugar, blood pressure and cholesterol control. Greatly improved with change in diet  Diabetic Labs Latest Ref Rng & Units 04/03/2018 12/26/2017 06/20/2017 01/14/2017 07/04/2016  HbA1c <5.7 % of total Hgb 7.3(H) 7.9(H) 7.5(H) 7.4(H) 7.3(H)  Microalbumin mg/dL - - 3.6 - -  Micro/Creat Ratio <30 mcg/mg creat - - 18 - -  Chol <200 mg/dL 150 165 141 167 150  HDL > OR = 50 mg/dL 40(L) 44(L) 44(L) 45(L) 45(L)  Calc LDL mg/dL (calc) 70 85 71 87 63  Triglycerides <150 mg/dL 331(H) 275(H) 184(H) 265(H) 208(H)  Creatinine 0.60 - 0.93 mg/dL 0.83 0.76 0.71 0.74 0.76   BP/Weight 04/09/2018 01/08/2018 01/01/2018 09/22/2017 07/31/2017 01/20/2017 4/66/5993  Systolic BP 570 - 177 939 030 092 330  Diastolic BP 78 - 76 70 72 78 74  Wt. (Lbs) 270 286 285.08 276 274 279 281  BMI 39.87 42.23 42.1 40.76 40.46 41.2 41.5   Foot/eye exam completion dates Latest Ref Rng & Units 07/31/2017 03/04/2017  Eye Exam No Retinopathy - No Retinopathy  Foot exam Order - - -  Foot Form Completion - Done -

## 2018-04-11 NOTE — Assessment & Plan Note (Signed)
Theresa Adkins is reminded of the importance of commitment to daily physical activity for 30 minutes or more, as able and the need to limit carbohydrate intake to 30 to 60 grams per meal to help with blood sugar control.   The need to take medication as prescribed, test blood sugar as directed, and to call between visits if there is a concern that blood sugar is uncontrolled is also discussed.   Theresa Adkins is reminded of the importance of daily foot exam, annual eye examination, and good blood sugar, blood pressure and cholesterol control. Greatly improved with change in diet  Diabetic Labs Latest Ref Rng & Units 04/03/2018 12/26/2017 06/20/2017 01/14/2017 07/04/2016  HbA1c <5.7 % of total Hgb 7.3(H) 7.9(H) 7.5(H) 7.4(H) 7.3(H)  Microalbumin mg/dL - - 3.6 - -  Micro/Creat Ratio <30 mcg/mg creat - - 18 - -  Chol <200 mg/dL 150 165 141 167 150  HDL > OR = 50 mg/dL 40(L) 44(L) 44(L) 45(L) 45(L)  Calc LDL mg/dL (calc) 70 85 71 87 63  Triglycerides <150 mg/dL 331(H) 275(H) 184(H) 265(H) 208(H)  Creatinine 0.60 - 0.93 mg/dL 0.83 0.76 0.71 0.74 0.76   BP/Weight 04/09/2018 01/08/2018 01/01/2018 09/22/2017 07/31/2017 01/20/2017 04/18/3141  Systolic BP 888 - 757 972 820 601 561  Diastolic BP 78 - 76 70 72 78 74  Wt. (Lbs) 270 286 285.08 276 274 279 281  BMI 39.87 42.23 42.1 40.76 40.46 41.2 41.5   Foot/eye exam completion dates Latest Ref Rng & Units 07/31/2017 03/04/2017  Eye Exam No Retinopathy - No Retinopathy  Foot exam Order - - -  Foot Form Completion - Done -

## 2018-04-11 NOTE — Assessment & Plan Note (Signed)
Hyperlipidemia:Low fat diet discussed and encouraged.   Lipid Panel  Lab Results  Component Value Date   CHOL 150 04/03/2018   HDL 40 (L) 04/03/2018   LDLCALC 70 04/03/2018   TRIG 331 (H) 04/03/2018   CHOLHDL 3.8 04/03/2018   Needs to reduce nuts and cheese, TG very elevated

## 2018-04-19 ENCOUNTER — Other Ambulatory Visit: Payer: Self-pay | Admitting: Family Medicine

## 2018-04-21 ENCOUNTER — Ambulatory Visit
Admission: RE | Admit: 2018-04-21 | Discharge: 2018-04-21 | Disposition: A | Discharge status: Home / Self Care | Payer: PPO | Source: Ambulatory Visit | Attending: Family Medicine | Admitting: Family Medicine

## 2018-04-21 DIAGNOSIS — Z853 Personal history of malignant neoplasm of breast: Secondary | ICD-10-CM

## 2018-04-21 DIAGNOSIS — R928 Other abnormal and inconclusive findings on diagnostic imaging of breast: Secondary | ICD-10-CM | POA: Diagnosis not present

## 2018-05-04 DIAGNOSIS — H5213 Myopia, bilateral: Secondary | ICD-10-CM | POA: Diagnosis not present

## 2018-05-04 DIAGNOSIS — H2513 Age-related nuclear cataract, bilateral: Secondary | ICD-10-CM | POA: Diagnosis not present

## 2018-05-04 DIAGNOSIS — E119 Type 2 diabetes mellitus without complications: Secondary | ICD-10-CM | POA: Diagnosis not present

## 2018-05-04 DIAGNOSIS — H524 Presbyopia: Secondary | ICD-10-CM | POA: Diagnosis not present

## 2018-05-04 DIAGNOSIS — Z7984 Long term (current) use of oral hypoglycemic drugs: Secondary | ICD-10-CM | POA: Diagnosis not present

## 2018-05-04 DIAGNOSIS — H52203 Unspecified astigmatism, bilateral: Secondary | ICD-10-CM | POA: Diagnosis not present

## 2018-05-04 LAB — HM DIABETES EYE EXAM

## 2018-06-14 ENCOUNTER — Other Ambulatory Visit: Payer: Self-pay | Admitting: Family Medicine

## 2018-06-14 DIAGNOSIS — E785 Hyperlipidemia, unspecified: Secondary | ICD-10-CM

## 2018-07-20 ENCOUNTER — Other Ambulatory Visit: Payer: Self-pay | Admitting: Family Medicine

## 2018-08-03 ENCOUNTER — Other Ambulatory Visit: Payer: PPO

## 2018-08-03 ENCOUNTER — Other Ambulatory Visit: Payer: Self-pay

## 2018-08-03 DIAGNOSIS — R6889 Other general symptoms and signs: Secondary | ICD-10-CM | POA: Diagnosis not present

## 2018-08-03 DIAGNOSIS — Z20822 Contact with and (suspected) exposure to covid-19: Secondary | ICD-10-CM

## 2018-08-03 DIAGNOSIS — E785 Hyperlipidemia, unspecified: Secondary | ICD-10-CM | POA: Diagnosis not present

## 2018-08-03 DIAGNOSIS — E1140 Type 2 diabetes mellitus with diabetic neuropathy, unspecified: Secondary | ICD-10-CM | POA: Diagnosis not present

## 2018-08-03 DIAGNOSIS — E559 Vitamin D deficiency, unspecified: Secondary | ICD-10-CM | POA: Diagnosis not present

## 2018-08-03 LAB — TSH: TSH: 1.69 m[IU]/L (ref 0.40–4.50)

## 2018-08-03 NOTE — Progress Notes (Signed)
[id number] 

## 2018-08-04 LAB — CBC
HCT: 36 % (ref 35.0–45.0)
Hemoglobin: 11.8 g/dL (ref 11.7–15.5)
MCH: 26.3 pg — ABNORMAL LOW (ref 27.0–33.0)
MCHC: 32.8 g/dL (ref 32.0–36.0)
MCV: 80.2 fL (ref 80.0–100.0)
MPV: 10.3 fL (ref 7.5–12.5)
Platelets: 282 10*3/uL (ref 140–400)
RBC: 4.49 10*6/uL (ref 3.80–5.10)
RDW: 15 % (ref 11.0–15.0)
WBC: 6.2 10*3/uL (ref 3.8–10.8)

## 2018-08-04 LAB — COMPLETE METABOLIC PANEL WITH GFR
AG Ratio: 1.7 (calc) (ref 1.0–2.5)
ALT: 10 U/L (ref 6–29)
AST: 10 U/L (ref 10–35)
Albumin: 4.2 g/dL (ref 3.6–5.1)
Alkaline phosphatase (APISO): 53 U/L (ref 37–153)
BUN: 9 mg/dL (ref 7–25)
CO2: 28 mmol/L (ref 20–32)
Calcium: 9.3 mg/dL (ref 8.6–10.4)
Chloride: 101 mmol/L (ref 98–110)
Creat: 0.77 mg/dL (ref 0.60–0.93)
GFR, Est African American: 89 mL/min/{1.73_m2} (ref >=60)
GFR, Est Non African American: 77 mL/min/{1.73_m2} (ref >=60)
Globulin: 2.5 g/dL (calc) (ref 1.9–3.7)
Glucose, Bld: 141 mg/dL — ABNORMAL HIGH (ref 65–99)
Potassium: 4.6 mmol/L (ref 3.5–5.3)
Sodium: 139 mmol/L (ref 135–146)
Total Bilirubin: 0.6 mg/dL (ref 0.2–1.2)
Total Protein: 6.7 g/dL (ref 6.1–8.1)

## 2018-08-04 LAB — LIPID PANEL
Cholesterol: 156 mg/dL (ref <200)
HDL: 43 mg/dL — ABNORMAL LOW (ref >=50)
LDL Cholesterol (Calc): 82 mg/dL (calc)
Non-HDL Cholesterol (Calc): 113 mg/dL (calc) (ref <130)
Total CHOL/HDL Ratio: 3.6 (calc) (ref <5.0)
Triglycerides: 222 mg/dL — ABNORMAL HIGH (ref <150)

## 2018-08-04 LAB — HEMOGLOBIN A1C
EAG (MMOL/L): 9.2 (calc)
Hgb A1c MFr Bld: 7.4 % of total Hgb — ABNORMAL HIGH (ref <5.7)
Mean Plasma Glucose: 166 (calc)

## 2018-08-04 LAB — VITAMIN D 25 HYDROXY (VIT D DEFICIENCY, FRACTURES): Vit D, 25-Hydroxy: 34 ng/mL (ref 30–100)

## 2018-08-06 ENCOUNTER — Ambulatory Visit (INDEPENDENT_AMBULATORY_CARE_PROVIDER_SITE_OTHER): Payer: PPO | Admitting: Family Medicine

## 2018-08-06 ENCOUNTER — Encounter: Payer: Self-pay | Admitting: Family Medicine

## 2018-08-06 ENCOUNTER — Other Ambulatory Visit: Payer: Self-pay

## 2018-08-06 VITALS — BP 120/78 | Ht 69.0 in | Wt 270.0 lb

## 2018-08-06 DIAGNOSIS — E1159 Type 2 diabetes mellitus with other circulatory complications: Secondary | ICD-10-CM

## 2018-08-06 DIAGNOSIS — L732 Hidradenitis suppurativa: Secondary | ICD-10-CM | POA: Diagnosis not present

## 2018-08-06 DIAGNOSIS — I152 Hypertension secondary to endocrine disorders: Secondary | ICD-10-CM

## 2018-08-06 DIAGNOSIS — I10 Essential (primary) hypertension: Secondary | ICD-10-CM

## 2018-08-06 DIAGNOSIS — Z1211 Encounter for screening for malignant neoplasm of colon: Secondary | ICD-10-CM

## 2018-08-06 DIAGNOSIS — E785 Hyperlipidemia, unspecified: Secondary | ICD-10-CM

## 2018-08-06 DIAGNOSIS — E1140 Type 2 diabetes mellitus with diabetic neuropathy, unspecified: Secondary | ICD-10-CM | POA: Diagnosis not present

## 2018-08-06 LAB — NOVEL CORONAVIRUS, NAA: SARS-CoV-2, NAA: NOT DETECTED

## 2018-08-06 NOTE — Progress Notes (Signed)
Virtual Visit via Telephone Note  I connected with Theresa Adkins on 08/06/18 at  9:00 AM EDT by telephone and verified that I am speaking with the correct person using two identifiers. This visit type is conducted due to national recommendations for restrictions regarding the COVID -19 Pandemic. Due to the patient's age and / or co morbidities, this format is felt to be most appropriate at this time without adequate follow up. The patient has no access to video technology/ had technical difficulties with video, requiring transitioning to audio format  only ( telephone ). All issues noted this document were discussed and addressed,no physical exam can be performed in this format.   Location: Patient: home Provider: office   I discussed the limitations, risks, security and privacy concerns of performing an evaluation and management service by telephone and the availability of in person appointments. I also discussed with the patient that there may be a patient responsible charge related to this service. The patient expressed understanding and agreed to proceed.   History of Present Illness: F/U chronic problems , review of recnt results, update medicatrion and health maintainace Denies recent fever or chills. Denies sinus pressure, nasal congestion, ear pain or sore throat. Denies chest congestion, productive cough or wheezing. Denies chest pains, palpitations and leg swelling Denies abdominal pain, nausea, vomiting,diarrhea or constipation.   Denies dysuria, frequency, hesitancy or incontinence. Denies joint pain, swelling and limitation in mobility. Denies headaches, seizures, numbness, or tingling. Denies depression, anxiety or insomnia. Chronic  skin break down denies current pain or purulent drainage Denies polyuria, polydipsia, blurred vision , or hypoglycemic episodes.      Observations/Objective: BP 120/78   Ht 5\' 9"  (1.753 m)   Wt 270 lb (122.5 kg)   BMI 39.87 kg/m   Good communication with no confusion and intact memory. Alert and oriented x 3 No signs of respiratory distress during speech    Assessment and Plan:  Hypertension associated with diabetes ([place]) Controlled, no change in medication DASH diet and commitment to daily physical activity for a minimum of 30 minutes discussed and encouraged, as a part of hypertension management. The importance of attaining a healthy weight is also discussed.  BP/Weight 08/06/2018 04/09/2018 01/08/2018 01/01/2018 09/22/2017 07/31/2017 61/05/4313  Systolic BP 400 867 - 619 509 326 712  Diastolic BP 78 78 - 76 70 72 78  Wt. (Lbs) 270 270 286 285.08 276 274 279  BMI 39.87 39.87 42.23 42.1 40.76 40.46 41.2       Tubular adenoma of colon colonoscopy overdue, she is encouraged to follow up on testing, states she wil make her own appointment and is aware of the need  Hyperlipidemia Hyperlipidemia:Low fat diet discussed and encouraged.   Lipid Panel  Lab Results  Component Value Date   CHOL 156 08/03/2018   HDL 43 (L) 08/03/2018   LDLCALC 82 08/03/2018   TRIG 222 (H) 08/03/2018   CHOLHDL 3.6 08/03/2018  needs to increase exercise and reduce fatty foods     HIDRADENITIS Chronic , no current flare or active infection  Morbid obesity ([place]) Obesity linked with hypertension and diabetes and cancer Patient re-educated about  the importance of commitment to a  minimum of 150 minutes of exercise per week as able.  The importance of healthy food choices with portion control discussed, as well as eating regularly and within a 12 hour window most days. The need to choose "clean , green" food 50 to 75% of the time is discussed, as well  as to make water the primary drink and set a goal of 64 ounces water daily.    Weight /BMI 08/06/2018 04/09/2018 01/08/2018  WEIGHT 270 lb 270 lb 286 lb  HEIGHT 5\' 9"  5\' 9"  5\' 9"   BMI 39.87 kg/m2 39.87 kg/m2 42.23 kg/m2      Controlled type 2 diabetes with neuropathy  ([facility]) Controlled, no change in medication Ms. Rayos is reminded of the importance of commitment to daily physical activity for 30 minutes or more, as able and the need to limit carbohydrate intake to 30 to 60 grams per meal to help with blood sugar control.   The need to take medication as prescribed, test blood sugar as directed, and to call between visits if there is a concern that blood sugar is uncontrolled is also discussed.   Ms. Pursifull is reminded of the importance of daily foot exam, annual eye examination, and good blood sugar, blood pressure and cholesterol control.  Diabetic Labs Latest Ref Rng & Units 08/03/2018 04/03/2018 12/26/2017 06/20/2017 01/14/2017  HbA1c <5.7 % of total Hgb 7.4(H) 7.3(H) 7.9(H) 7.5(H) 7.4(H)  Microalbumin mg/dL - - - 3.6 -  Micro/Creat Ratio <30 mcg/mg creat - - - 18 -  Chol <200 mg/dL 156 150 165 141 167  HDL > OR = 50 mg/dL 43(L) 40(L) 44(L) 44(L) 45(L)  Calc LDL mg/dL (calc) 82 70 85 71 87  Triglycerides <150 mg/dL 222(H) 331(H) 275(H) 184(H) 265(H)  Creatinine 0.60 - 0.93 mg/dL 0.77 0.83 0.76 0.71 0.74   BP/Weight 08/06/2018 04/09/2018 01/08/2018 01/01/2018 09/22/2017 07/31/2017 16/02/958  Systolic BP 454 098 - 119 147 829 562  Diastolic BP 78 78 - 76 70 72 78  Wt. (Lbs) 270 270 286 285.08 276 274 279  BMI 39.87 39.87 42.23 42.1 40.76 40.46 41.2   Foot/eye exam completion dates Latest Ref Rng & Units 05/04/2018 07/31/2017  Eye Exam No Retinopathy No Retinopathy -  Foot exam Order - - -  Foot Form Completion - - Done         Follow Up Instructions:    I discussed the assessment and treatment plan with the patient. The patient was provided an opportunity to ask questions and all were answered. The patient agreed with the plan and demonstrated an understanding of the instructions.   The patient was advised to call back or seek an in-person evaluation if the symptoms worsen or if the condition fails to improve as anticipated.  I provided 25 minutes of  non-face-to-face time during this encounter.   [name], MD

## 2018-08-06 NOTE — Patient Instructions (Signed)
Annual Physical exam early  November, call if you need me before  Please get fasting lipid, cmp and eGFR, hBA1C 1 week before Nov appt  Recent lab results are mailed to you  pLEASE get your colonoscopy done , this is due  PLEASE choose to eat healthy foods,  Which are vegetables and fruit, unprocessed, also beans , and eggs without the yolk, white meats grilled or baked  AVOID sugar and foods prepared with sugar  It is important that you exercise regularly at least 30 minutes 5 times a week. If you develop chest pain, have severe difficulty breathing, or feel very tired, stop exercising immediately and seek medical attention  Thanks for choosing [facility], we consider it a privelige to serve you.

## 2018-08-08 ENCOUNTER — Encounter: Payer: Self-pay | Admitting: Family Medicine

## 2018-08-08 NOTE — Assessment & Plan Note (Signed)
Controlled, no change in medication Theresa Adkins is reminded of the importance of commitment to daily physical activity for 30 minutes or more, as able and the need to limit carbohydrate intake to 30 to 60 grams per meal to help with blood sugar control.   The need to take medication as prescribed, test blood sugar as directed, and to call between visits if there is a concern that blood sugar is uncontrolled is also discussed.   Theresa Adkins is reminded of the importance of daily foot exam, annual eye examination, and good blood sugar, blood pressure and cholesterol control.  Diabetic Labs Latest Ref Rng & Units 08/03/2018 04/03/2018 12/26/2017 06/20/2017 01/14/2017  HbA1c <5.7 % of total Hgb 7.4(H) 7.3(H) 7.9(H) 7.5(H) 7.4(H)  Microalbumin mg/dL - - - 3.6 -  Micro/Creat Ratio <30 mcg/mg creat - - - 18 -  Chol <200 mg/dL 156 150 165 141 167  HDL > OR = 50 mg/dL 43(L) 40(L) 44(L) 44(L) 45(L)  Calc LDL mg/dL (calc) 82 70 85 71 87  Triglycerides <150 mg/dL 222(H) 331(H) 275(H) 184(H) 265(H)  Creatinine 0.60 - 0.93 mg/dL 0.77 0.83 0.76 0.71 0.74   BP/Weight 08/06/2018 04/09/2018 01/08/2018 01/01/2018 09/22/2017 07/31/2017 50/06/1831  Systolic BP 582 518 - 984 210 312 811  Diastolic BP 78 78 - 76 70 72 78  Wt. (Lbs) 270 270 286 285.08 276 274 279  BMI 39.87 39.87 42.23 42.1 40.76 40.46 41.2   Foot/eye exam completion dates Latest Ref Rng & Units 05/04/2018 07/31/2017  Eye Exam No Retinopathy No Retinopathy -  Foot exam Order - - -  Foot Form Completion - - Done

## 2018-08-08 NOTE — Assessment & Plan Note (Signed)
Controlled, no change in medication DASH diet and commitment to daily physical activity for a minimum of 30 minutes discussed and encouraged, as a part of hypertension management. The importance of attaining a healthy weight is also discussed.  BP/Weight 08/06/2018 04/09/2018 01/08/2018 01/01/2018 09/22/2017 07/31/2017 29/06/2839  Systolic BP 324 401 - 027 253 664 403  Diastolic BP 78 78 - 76 70 72 78  Wt. (Lbs) 270 270 286 285.08 276 274 279  BMI 39.87 39.87 42.23 42.1 40.76 40.46 41.2

## 2018-08-08 NOTE — Assessment & Plan Note (Signed)
Chronic , no current flare or active infection

## 2018-08-08 NOTE — Assessment & Plan Note (Addendum)
Obesity linked with hypertension and diabetes and cancer Patient re-educated about  the importance of commitment to a  minimum of 150 minutes of exercise per week as able.  The importance of healthy food choices with portion control discussed, as well as eating regularly and within a 12 hour window most days. The need to choose "clean , green" food 50 to 75% of the time is discussed, as well as to make water the primary drink and set a goal of 64 ounces water daily.    Weight /BMI 08/06/2018 04/09/2018 01/08/2018  WEIGHT 270 lb 270 lb 286 lb  HEIGHT 5\' 9"  5\' 9"  5\' 9"   BMI 39.87 kg/m2 39.87 kg/m2 42.23 kg/m2

## 2018-08-08 NOTE — Assessment & Plan Note (Signed)
Hyperlipidemia:Low fat diet discussed and encouraged.   Lipid Panel  Lab Results  Component Value Date   CHOL 156 08/03/2018   HDL 43 (L) 08/03/2018   LDLCALC 82 08/03/2018   TRIG 222 (H) 08/03/2018   CHOLHDL 3.6 08/03/2018  needs to increase exercise and reduce fatty foods

## 2018-08-08 NOTE — Assessment & Plan Note (Signed)
colonoscopy overdue, she is encouraged to follow up on testing, states she wil make her own appointment and is aware of the need

## 2018-08-10 ENCOUNTER — Encounter: Payer: Self-pay | Admitting: Gastroenterology

## 2018-08-13 ENCOUNTER — Ambulatory Visit: Payer: PPO | Admitting: Family Medicine

## 2018-09-25 ENCOUNTER — Other Ambulatory Visit: Payer: Self-pay

## 2018-09-25 ENCOUNTER — Ambulatory Visit (INDEPENDENT_AMBULATORY_CARE_PROVIDER_SITE_OTHER): Payer: PPO | Admitting: Family Medicine

## 2018-09-25 ENCOUNTER — Encounter: Payer: Self-pay | Admitting: Family Medicine

## 2018-09-25 VITALS — BP 120/78 | HR 83 | Resp 15 | Ht 69.0 in | Wt 270.0 lb

## 2018-09-25 DIAGNOSIS — Z0000 Encounter for general adult medical examination without abnormal findings: Secondary | ICD-10-CM

## 2018-09-25 NOTE — Progress Notes (Signed)
Subjective:   Theresa Adkins is a 73 y.o. female who presents for Medicare Annual (Subsequent) preventive examination.  Location of Patient: Home Location of Provider: Telehealth Consent was obtain for visit to be over via telehealth. I verified that I am speaking with the correct person using two identifiers.   Review of Systems:    Cardiac Risk Factors include: advanced age (>36men, >69 women);diabetes mellitus;dyslipidemia;hypertension;obesity (BMI >30kg/m2)     Objective:     Vitals: BP 120/78   Pulse 83   Resp 15   Ht 5\' 9"  (1.753 m)   Wt 270 lb (122.5 kg)   BMI 39.87 kg/m   Body mass index is 39.87 kg/m.  Advanced Directives 09/22/2017 06/24/2016 05/14/2016 08/09/2013 06/25/2013  Does Patient Have a Medical Advance Directive? No No Yes Patient does not have advance directive;Patient would not like information Patient does not have advance directive;Patient would not like information  Type of Advance Directive - - Living will;Healthcare Power of Attorney - -  Does patient want to make changes to medical advance directive? - - No - Patient declined - -  Copy of [place] in Chart? - - No - copy requested - -  Would patient like information on creating a medical advance directive? Yes (ED - Information included in AVS) No - Patient declined - - -    Tobacco Social History   Tobacco Use  Smoking Status Former Smoker  . Packs/day: 1.00  . Years: 25.00  . Pack years: 25.00  . Types: Cigarettes  . Quit date: 02/17/2002  . Years since quitting: 16.6  Smokeless Tobacco Never Used     Counseling given: Yes   Clinical Intake:  Pre-visit preparation completed: Yes  Pain : No/denies pain Pain Score: 0-No pain     BMI - recorded: 39.87 Nutritional Status: BMI > 30  Obese Nutritional Risks: None Diabetes: Yes CBG done?: No Did pt. bring in CBG monitor from home?: No  How often do you need to have someone help you when you read instructions,  pamphlets, or other written materials from your doctor or pharmacy?: 1 - Never What is the last grade level you completed in school?: some college  Interpreter Needed?: No     Past Medical History:  Diagnosis Date  . Allergic rhinitis, seasonal   . Arthritis   . Breast cancer [facility]) dx 02/ 2015---  oncologist-  dr shadad/ dr Lisbeth Renshaw   dx Right breast upper-outer quadrant DCIS, high grade (Tis N0), ER+, PR negative--- 07-01-2013  s/p  right breast lumpectomy w/ sln bxs and radiation therapy (08-24-2013 to 09-20-2013)  . Diabetes mellitus type II   . Diabetic neuropathy ([place])   . Heart murmur   . Hidradenitis axillaris    chronic  . History of adenomatous polyp of colon    03-04-2008  tubular adenoma  . History of radiation therapy 08-24-2013 to 09-20-2013   total 50Gy  . Hyperlipidemia   . Hypertension   . Personal history of radiation therapy   . Thickened endometrium   . Wears dentures    upper  . Wears glasses    Past Surgical History:  Procedure Laterality Date  . BREAST BIOPSY Right 04/16/2016  . BREAST LUMPECTOMY Right 08/2013  . BREAST LUMPECTOMY WITH NEEDLE LOCALIZATION AND AXILLARY SENTINEL LYMPH NODE BX Right 07/01/2013   Procedure: BREAST LUMPECTOMY WITH NEEDLE LOCALIZATION AND AXILLARY SENTINEL LYMPH NODE BX;  Surgeon: Merrie Roof, MD;  Location: [place];  Service:  General;  Laterality: Right;  . BREAST SURGERY  1970s   benign tumor removed from unspecified breast   . COLONOSCOPY W/ POLYPECTOMY  03/04/2008  . DILATATION & CURRETTAGE/HYSTEROSCOPY WITH RESECTOCOPE N/A 05/14/2016   Procedure: [place];  Surgeon: Eldred Manges, MD;  Location: [place];  Service: Gynecology;  Laterality: N/A;   Family History  Problem Relation Age of Onset  . Cancer Brother 65       bone   . Obesity Sister   . Heart disease Mother   . Cancer Sister 9       leukemia   . Obesity Sister    Social History    Socioeconomic History  . Marital status: Divorced    Spouse name: Not on file  . Number of children: 1  . Years of education: Not on file  . Highest education level: Not on file  Occupational History  . Occupation: Employed   Scientific laboratory technician  . Financial resource strain: Not hard at all  . Food insecurity    Worry: Never true    Inability: Never true  . Transportation needs    Medical: No    Non-medical: No  Tobacco Use  . Smoking status: Former Smoker    Packs/day: 1.00    Years: 25.00    Pack years: 25.00    Types: Cigarettes    Quit date: 02/17/2002    Years since quitting: 16.6  . Smokeless tobacco: Never Used  Substance and Sexual Activity  . Alcohol use: No  . Drug use: No  . Sexual activity: Never  Lifestyle  . Physical activity    Days per week: 0 days    Minutes per session: 0 min  . Stress: Not at all  Relationships  . Social connections    Talks on phone: More than three times a week    Gets together: Three times a week    Attends religious service: Never    Active member of club or organization: No    Attends meetings of clubs or organizations: Never    Relationship status: Divorced  Other Topics Concern  . Not on file  Social History Narrative  . Not on file    Outpatient Encounter Medications as of 09/25/2018  Medication Sig  . amLODipine (NORVASC) 5 MG tablet Take 1 tablet (5 mg total) by mouth daily.  Marland Kitchen atorvastatin (LIPITOR) 10 MG tablet Take 1 tablet by mouth once daily  . bisoprolol-hydrochlorothiazide (ZIAC) 10-6.25 MG tablet Take 1 tablet by mouth once daily  . metFORMIN (GLUCOPHAGE) 1000 MG tablet TAKE 1 TABLET BY MOUTH ONCE DAILY WITH A MEAL  . UNABLE TO FIND Diabetic shoes x 1  Inserts x 3 pair  Dx e11.9   No facility-administered encounter medications on file as of 09/25/2018.     Activities of Daily Living In your present state of health, do you have any difficulty performing the following activities: 09/25/2018  Hearing? N  Vision? N   Difficulty concentrating or making decisions? N  Walking or climbing stairs? N  Dressing or bathing? N  Doing errands, shopping? N  Preparing Food and eating ? N  Using the Toilet? N  In the past six months, have you accidently leaked urine? N  Do you have problems with loss of bowel control? N  Managing your Medications? N  Managing your Finances? N  Housekeeping or managing your Housekeeping? N  Some recent data might be hidden    Patient  Care Team: Fayrene Helper, MD as PCP - General Rothbart, Cristopher Estimable, MD (Cardiology) Jovita Kussmaul, MD as Consulting Physician (General Surgery) Rutherford Guys, MD as Consulting Physician (Ophthalmology)    Assessment:   This is a routine wellness examination for Joes.  Exercise Activities and Dietary recommendations Current Exercise Habits: The patient does not participate in regular exercise at present, Exercise limited by: None identified  Goals    . Exercise 3x per week (30 min per time)     Recommend starting a routine exercise program at least 3 days a week for 30-45 minutes at a time as tolerated.         Fall Risk Fall Risk  09/25/2018 08/06/2018 04/09/2018 01/08/2018 01/01/2018  Falls in the past year? 1 1 0 0 0  Number falls in past yr: 0 0 0 - 0  Injury with Fall? 0 0 0 - 0  Risk for fall due to : - - - History of fall(s) -   Is the patient's home free of loose throw rugs in walkways, pet beds, electrical cords, etc?   yes      Grab bars in the bathroom? no      Handrails on the stairs?   no      Adequate lighting?   yes     Depression Screen PHQ 2/9 Scores 09/25/2018 08/06/2018 04/09/2018 01/08/2018  PHQ - 2 Score 1 0 1 0  PHQ- 9 Score - - 2 -     Cognitive Function MMSE - Mini Mental State Exam 04/28/2014  Orientation to time 5  Orientation to Place 5  Registration 3  Attention/ Calculation 5  Recall 3  Language- name 2 objects 2  Language- repeat 1  Language- follow 3 step command 3  Language- read &  follow direction 1  Write a sentence 1  Copy design 1  Total score 30     6CIT Screen 09/25/2018 09/22/2017 06/24/2016  What Year? 0 points 0 points 0 points  What month? 0 points 0 points 0 points  What time? 0 points 0 points 0 points  Count back from 20 0 points 0 points 0 points  Months in reverse 0 points 0 points 0 points  Repeat phrase 0 points 0 points 0 points  Total Score 0 0 0    Immunization History  Administered Date(s) Administered  . Influenza Split 01/13/2012  . Influenza,inj,Quad PF,6+ Mos 11/13/2012, 11/25/2013, 11/01/2014, 12/21/2015, 12/03/2016, 12/05/2017  . Pneumococcal Conjugate-13 12/29/2014  . Pneumococcal Polysaccharide-23 01/19/2016  . Tdap 06/05/2010  . Zoster 06/05/2010    Qualifies for Shingles Vaccine? Completed  Screening Tests Health Maintenance  Topic Date Due  . FOOT EXAM  08/02/2018  . INFLUENZA VACCINE  09/19/2018  . HEMOGLOBIN A1C  02/02/2019  . OPHTHALMOLOGY EXAM  05/04/2019  . COLONOSCOPY  02/17/2020  . MAMMOGRAM  04/20/2020  . TETANUS/TDAP  06/04/2020  . DEXA SCAN  Completed  . Hepatitis C Screening  Completed  . PNA vac Low Risk Adult  Completed    Cancer Screenings: Lung: Low Dose CT Chest recommended if Age 73-80 years, 30 pack-year currently smoking OR have quit w/in 15years. Patient does not qualify. Breast:  Up to date on Mammogram? Yes   Up to date of Bone Density/Dexa? Yes Colorectal:  Is being seen this month for appt  Additional Screenings:   Hepatitis C Screening: completed     Plan:   1. Encounter for Medicare annual wellness exam  I have personally reviewed  and noted the following in the patient's chart:   . Medical and social history . Use of alcohol, tobacco or illicit drugs  . Current medications and supplements . Functional ability and status . Nutritional status . Physical activity . Advanced directives . List of other physicians . Hospitalizations, surgeries, and ER visits in previous 12 months .  Vitals . Screenings to include cognitive, depression, and falls . Referrals and appointments  In addition, I have reviewed and discussed with patient certain preventive protocols, quality metrics, and best practice recommendations. A written personalized care plan for preventive services as well as general preventive health recommendations were provided to patient.     I provided 20 minutes of non-face-to-face time during this encounter.    Perlie Mayo, NP  09/25/2018

## 2018-09-25 NOTE — Patient Instructions (Signed)
Theresa Adkins , Thank you for taking time to come for your Medicare Wellness Visit. I appreciate your ongoing commitment to your health goals. Please review the following plan we discussed and let me know if I can assist you in the future.   Please continue to practice social distancing to keep you, your family, and our community safe.  If you must go out, please wear a Mask and practice good handwashing.  Screening recommendations/referrals: Colonoscopy: Coming up Mammogram: Due 04/2019 Bone Density: Completed Recommended yearly ophthalmology/optometry visit for glaucoma screening and checkup Recommended yearly dental visit for hygiene and checkup  Vaccinations: Influenza vaccine: Due Fall 2020 Pneumococcal vaccine: Completed Tdap vaccine: Due 2022 Shingles vaccine: Completed  Advanced directives:  Decline paperwork today  Conditions/risks identified: Fall  Next appointment: 12/21/2018    Preventive Care 40 Years and Older, Female Preventive care refers to lifestyle choices and visits with your health care provider that can promote health and wellness. What does preventive care include?  A yearly physical exam. This is also called an annual well check.  Dental exams once or twice a year.  Routine eye exams. Ask your health care provider how often you should have your eyes checked.  Personal lifestyle choices, including:  Daily care of your teeth and gums.  Regular physical activity.  Eating a healthy diet.  Avoiding tobacco and drug use.  Limiting alcohol use.  Practicing safe sex.  Taking low-dose aspirin every day.  Taking vitamin and mineral supplements as recommended by your health care provider. What happens during an annual well check? The services and screenings done by your health care provider during your annual well check will depend on your age, overall health, lifestyle risk factors, and family history of disease. Counseling  Your health care provider  may ask you questions about your:  Alcohol use.  Tobacco use.  Drug use.  Emotional well-being.  Home and relationship well-being.  Sexual activity.  Eating habits.  History of falls.  Memory and ability to understand (cognition).  Work and work Statistician.  Reproductive health. Screening  You may have the following tests or measurements:  Height, weight, and BMI.  Blood pressure.  Lipid and cholesterol levels. These may be checked every 5 years, or more frequently if you are over 79 years old.  Skin check.  Lung cancer screening. You may have this screening every year starting at age 78 if you have a 30-pack-year history of smoking and currently smoke or have quit within the past 15 years.  Fecal occult blood test (FOBT) of the stool. You may have this test every year starting at age 76.  Flexible sigmoidoscopy or colonoscopy. You may have a sigmoidoscopy every 5 years or a colonoscopy every 10 years starting at age 41.  Hepatitis C blood test.  Hepatitis B blood test.  Sexually transmitted disease (STD) testing.  Diabetes screening. This is done by checking your blood sugar (glucose) after you have not eaten for a while (fasting). You may have this done every 1-3 years.  Bone density scan. This is done to screen for osteoporosis. You may have this done starting at age 33.  Mammogram. This may be done every 1-2 years. Talk to your health care provider about how often you should have regular mammograms. Talk with your health care provider about your test results, treatment options, and if necessary, the need for more tests. Vaccines  Your health care provider may recommend certain vaccines, such as:  Influenza vaccine. This is recommended  every year.  Tetanus, diphtheria, and acellular pertussis (Tdap, Td) vaccine. You may need a Td booster every 10 years.  Zoster vaccine. You may need this after age 43.  Pneumococcal 13-valent conjugate (PCV13) vaccine.  One dose is recommended after age 83.  Pneumococcal polysaccharide (PPSV23) vaccine. One dose is recommended after age 39. Talk to your health care provider about which screenings and vaccines you need and how often you need them. This information is not intended to replace advice given to you by your health care provider. Make sure you discuss any questions you have with your health care provider. Document Released: 03/03/2015 Document Revised: 10/25/2015 Document Reviewed: 12/06/2014 Elsevier Interactive Patient Education  2017 [employer] Prevention in the Home Falls can cause injuries. They can happen to people of all ages. There are many things you can do to make your home safe and to help prevent falls. What can I do on the outside of my home?  Regularly fix the edges of walkways and driveways and fix any cracks.  Remove anything that might make you trip as you walk through a door, such as a raised step or threshold.  Trim any bushes or trees on the path to your home.  Use bright outdoor lighting.  Clear any walking paths of anything that might make someone trip, such as rocks or tools.  Regularly check to see if handrails are loose or broken. Make sure that both sides of any steps have handrails.  Any raised decks and porches should have guardrails on the edges.  Have any leaves, snow, or ice cleared regularly.  Use sand or salt on walking paths during winter.  Clean up any spills in your garage right away. This includes oil or grease spills. What can I do in the bathroom?  Use night lights.  Install grab bars by the toilet and in the tub and shower. Do not use towel bars as grab bars.  Use non-skid mats or decals in the tub or shower.  If you need to sit down in the shower, use a plastic, non-slip stool.  Keep the floor dry. Clean up any water that spills on the floor as soon as it happens.  Remove soap buildup in the tub or shower regularly.  Attach bath  mats securely with double-sided non-slip rug tape.  Do not have throw rugs and other things on the floor that can make you trip. What can I do in the bedroom?  Use night lights.  Make sure that you have a light by your bed that is easy to reach.  Do not use any sheets or blankets that are too big for your bed. They should not hang down onto the floor.  Have a firm chair that has side arms. You can use this for support while you get dressed.  Do not have throw rugs and other things on the floor that can make you trip. What can I do in the kitchen?  Clean up any spills right away.  Avoid walking on wet floors.  Keep items that you use a lot in easy-to-reach places.  If you need to reach something above you, use a strong step stool that has a grab bar.  Keep electrical cords out of the way.  Do not use floor polish or wax that makes floors slippery. If you must use wax, use non-skid floor wax.  Do not have throw rugs and other things on the floor that can make you trip. What  can I do with my stairs?  Do not leave any items on the stairs.  Make sure that there are handrails on both sides of the stairs and use them. Fix handrails that are broken or loose. Make sure that handrails are as long as the stairways.  Check any carpeting to make sure that it is firmly attached to the stairs. Fix any carpet that is loose or worn.  Avoid having throw rugs at the top or bottom of the stairs. If you do have throw rugs, attach them to the floor with carpet tape.  Make sure that you have a light switch at the top of the stairs and the bottom of the stairs. If you do not have them, ask someone to add them for you. What else can I do to help prevent falls?  Wear shoes that:  Do not have high heels.  Have rubber bottoms.  Are comfortable and fit you well.  Are closed at the toe. Do not wear sandals.  If you use a stepladder:  Make sure that it is fully opened. Do not climb a closed  stepladder.  Make sure that both sides of the stepladder are locked into place.  Ask someone to hold it for you, if possible.  Clearly mark and make sure that you can see:  Any grab bars or handrails.  First and last steps.  Where the edge of each step is.  Use tools that help you move around (mobility aids) if they are needed. These include:  Canes.  Walkers.  Scooters.  Crutches.  Turn on the lights when you go into a dark area. Replace any light bulbs as soon as they burn out.  Set up your furniture so you have a clear path. Avoid moving your furniture around.  If any of your floors are uneven, fix them.  If there are any pets around you, be aware of where they are.  Review your medicines with your doctor. Some medicines can make you feel dizzy. This can increase your chance of falling. Ask your doctor what other things that you can do to help prevent falls. This information is not intended to replace advice given to you by your health care provider. Make sure you discuss any questions you have with your health care provider. Document Released: 12/01/2008 Document Revised: 07/13/2015 Document Reviewed: 03/11/2014 Elsevier Interactive Patient Education  2017 [employer].

## 2018-10-02 ENCOUNTER — Other Ambulatory Visit: Payer: Self-pay | Admitting: Family Medicine

## 2018-10-14 ENCOUNTER — Ambulatory Visit (INDEPENDENT_AMBULATORY_CARE_PROVIDER_SITE_OTHER): Payer: PPO | Admitting: *Deleted

## 2018-10-14 ENCOUNTER — Other Ambulatory Visit: Payer: Self-pay

## 2018-10-14 DIAGNOSIS — Z86010 Personal history of colonic polyps: Secondary | ICD-10-CM

## 2018-10-14 MED ORDER — PEG 3350-KCL-NA BICARB-NACL 420 G PO SOLR: 4000.0000 mL | Freq: Once | ORAL | 0 refills | Status: AC

## 2018-10-14 NOTE — Patient Instructions (Signed)
Theresa Adkins   [birth year] MRN: [id number]    Procedure Date: 11/27/2018 Time to register: 7:30 am Place to register: [facility] Procedure Time: 8:30 am Scheduled provider: Dr. Oneida Alar  PREPARATION FOR COLONOSCOPY WITH TRI-LYTE SPLIT PREP  Please notify us immediately if you are diabetic, take iron supplements, or if you are on Coumadin or any other blood thinners.   Please hold the following medications: See letter  You will need to purchase 1 fleet enema and 1 box of Bisacodyl '5mg'$  tablets.   1 DAY BEFORE PROCEDURE:  DATE: 11/26/2018   DAY: Thursday Continue clear liquids the entire day - NO SOLID FOOD.   Diabetic medications adjustments for today: See letter  At 2:00 pm:  Take 2 Bisacodyl tablets.   At 4:00pm:  Start drinking your solution. Make sure you mix well per instructions on the bottle. Try to drink 1 (one) 8 ounce glass every 10-15 minutes until you have consumed HALF the jug. You should complete by 6:00pm.You must keep the left over solution refrigerated until completed next day.  Continue clear liquids. You must drink plenty of clear liquids to prevent dehyration and kidney failure.     DAY OF PROCEDURE:   DATE: 11/27/2018   DAY: Friday If you take medications for your heart, blood pressure or breathing, you may take these medications.  Diabetic medications adjustments for today: See letter  Five hours before your procedure time @ 3:30 am:  Finish remaining amout of bowel prep, drinking 1 (one) 8 ounce glass every 10-15 minutes until complete. You have two hours to consume remaining prep.   Three hours before your procedure time @ 5:30 am:  Nothing by mouth.   At least one hour before going to the hospital:  Give yourself one Fleet enema. You may take your morning medications with sip of water unless we have instructed otherwise.      Please see below for Dietary Information.  CLEAR LIQUIDS INCLUDE:  Water Jello (NOT red in color)   Ice Popsicles  (NOT red in color)   Tea (sugar ok, no milk/cream) Powdered fruit flavored drinks  Coffee (sugar ok, no milk/cream) Gatorade/ Lemonade/ Kool-Aid  (NOT red in color)   Juice: apple, white grape, white cranberry Soft drinks  Clear bullion, consomme, broth (fat free beef/chicken/vegetable)  Carbonated beverages (any kind)  Strained chicken noodle soup Hard Candy   Remember: Clear liquids are liquids that will allow you to see your fingers on the other side of a clear glass. Be sure liquids are NOT red in color, and not cloudy, but CLEAR.  DO NOT EAT OR DRINK ANY OF THE FOLLOWING:  Dairy products of any kind   Cranberry juice Tomato juice / V8 juice   Grapefruit juice Orange juice     Red grape juice  Do not eat any solid foods, including such foods as: cereal, oatmeal, yogurt, fruits, vegetables, creamed soups, eggs, bread, crackers, pureed foods in a blender, etc.   HELPFUL HINTS FOR DRINKING PREP SOLUTION:   Make sure prep is extremely cold. Mix and refrigerate the the morning of the prep. You may also put in the freezer.   You may try mixing some Crystal Light or Country Time Lemonade if you prefer. Mix in small amounts; add more if necessary.  Try drinking through a straw  Rinse mouth with water or a mouthwash between glasses, to remove after-taste.  Try sipping on a cold beverage /ice/ popsicles between glasses of prep.  Place  a piece of sugar-free hard candy in mouth between glasses.  If you become nauseated, try consuming smaller amounts, or stretch out the time between glasses. Stop for 30-60 minutes, then slowly start back drinking.        OTHER INSTRUCTIONS  You will need a responsible adult at least 73 years of age to accompany you and drive you home. This person must remain in the waiting room during your procedure. The hospital will cancel your procedure if you do not have a responsible adult with you.   1. Wear loose fitting clothing that is easily  removed. 2. Leave jewelry and other valuables at home.  3. Remove all body piercing jewelry and leave at home. 4. Total time from sign-in until discharge is approximately 2-3 hours. 5. You should go home directly after your procedure and rest. You can resume normal activities the day after your procedure. 6. The day of your procedure you should not:  Drive  Make legal decisions  Operate machinery  Drink alcohol  Return to work   You may call the office (Dept: [phone]) before 5:00pm, or page the doctor on call [phone]) after 5:00pm, for further instructions, if necessary.   Insurance Information YOU WILL NEED TO CHECK WITH YOUR INSURANCE COMPANY FOR THE BENEFITS OF COVERAGE YOU HAVE FOR THIS PROCEDURE.  UNFORTUNATELY, NOT ALL INSURANCE COMPANIES HAVE BENEFITS TO COVER ALL OR PART OF THESE TYPES OF PROCEDURES.  IT IS YOUR RESPONSIBILITY TO CHECK YOUR BENEFITS, HOWEVER, WE WILL BE GLAD TO ASSIST YOU WITH ANY CODES YOUR INSURANCE COMPANY MAY NEED.    PLEASE NOTE THAT MOST INSURANCE COMPANIES WILL NOT COVER A SCREENING COLONOSCOPY FOR PEOPLE UNDER THE AGE OF 50  IF YOU HAVE BCBS INSURANCE, YOU MAY HAVE BENEFITS FOR A SCREENING COLONOSCOPY BUT IF POLYPS ARE FOUND THE DIAGNOSIS WILL CHANGE AND THEN YOU MAY HAVE A DEDUCTIBLE THAT WILL NEED TO BE MET. SO PLEASE MAKE SURE YOU CHECK YOUR BENEFITS FOR A SCREENING COLONOSCOPY AS WELL AS A DIAGNOSTIC COLONOSCOPY.

## 2018-10-14 NOTE — Progress Notes (Signed)
Gastroenterology Pre-Procedure Review  Request Date: 10/14/2018 Requesting Physician: Dr. Moshe Cipro, Last TCS 03/04/08 done by Dr. Oneida Alar, adenoma  PATIENT REVIEW QUESTIONS: The patient responded to the following health history questions as indicated:    1. Diabetes Melitis: Yes 2. Joint replacements in the past 12 months: No 3. Major health problems in the past 3 months: No 4. Has an artificial valve or MVP: No 5. Has a defibrillator: No 6. Has been advised in past to take antibiotics in advance of a procedure like teeth cleaning: No 7. Family history of colon cancer: No  8. Alcohol Use: No 9. History of sleep apnea: No 10. History of coronary artery or other vascular stents placed within the last 12 months: No 11. History of any prior anesthesia complications: No    MEDICATIONS & ALLERGIES:    Patient reports the following regarding taking any blood thinners:   Plavix? No Aspirin? No Coumadin? No Brilinta? No Xarelto? No Eliquis? No Pradaxa? No Savaysa? No Effient? No  Patient confirms/reports the following medications:  Current Outpatient Medications  Medication Sig Dispense Refill  . amLODipine (NORVASC) 5 MG tablet Take 1 tablet by mouth once daily 90 tablet 0  . atorvastatin (LIPITOR) 10 MG tablet Take 1 tablet by mouth once daily 90 tablet 1  . bisoprolol-hydrochlorothiazide (ZIAC) 10-6.25 MG tablet Take 1 tablet by mouth once daily 90 tablet 1  . Cholecalciferol (VITAMIN D3) 50 MCG (2000 UT) CHEW Chew 2 tablets by mouth daily.    . metFORMIN (GLUCOPHAGE) 1000 MG tablet TAKE 1 TABLET BY MOUTH ONCE DAILY WITH A MEAL 90 tablet 0  . UNABLE TO FIND Diabetic shoes x 1  Inserts x 3 pair  Dx e11.9 1 each 0   No current facility-administered medications for this visit.     Patient confirms/reports the following allergies:  Allergies  Allergen Reactions  . Ace Inhibitors Other (See Comments)    Cough  . Aspirin Nausea Only    No orders of the defined types were placed  in this encounter.   AUTHORIZATION INFORMATION Primary Insurance: [employer],  ID #UM:[id number],  Group #: [id number] Pre-Cert / Auth required: No, not required  Secondary Insurance: [place],  [place] #: [id number],  Group #: [id number] Pre-Cert / Auth required: No, not required  SCHEDULE INFORMATION: Procedure has been scheduled as follows:  Date: 11/27/2018, Time: 8:30 Location: [facility] with Dr. Oneida Alar  This Gastroenterology Pre-Precedure Review Form is being routed to the following provider(s): Neil Crouch, PA-C

## 2018-10-16 NOTE — Progress Notes (Signed)
Ok to schedule.  Day before TCS: metformin 500mg  daily Am of tcs: no metformin

## 2018-10-19 ENCOUNTER — Encounter: Payer: Self-pay | Admitting: *Deleted

## 2018-10-19 NOTE — Progress Notes (Signed)
Mailed letter to pt with diabetes medication adjustments.   

## 2018-10-19 NOTE — Addendum Note (Signed)
Addended by: Metro Kung on: 10/19/2018 08:56 AM   Modules accepted: Orders, SmartSet

## 2018-11-10 ENCOUNTER — Ambulatory Visit: Payer: PPO

## 2018-11-10 ENCOUNTER — Other Ambulatory Visit: Payer: Self-pay

## 2018-11-25 ENCOUNTER — Other Ambulatory Visit (HOSPITAL_COMMUNITY)
Admission: RE | Admit: 2018-11-25 | Discharge: 2018-11-25 | Disposition: A | Discharge status: Home / Self Care | Payer: PPO | Source: Ambulatory Visit | Attending: Gastroenterology | Admitting: Gastroenterology

## 2018-11-25 ENCOUNTER — Other Ambulatory Visit: Payer: Self-pay

## 2018-11-25 DIAGNOSIS — Z01812 Encounter for preprocedural laboratory examination: Secondary | ICD-10-CM | POA: Diagnosis not present

## 2018-11-25 DIAGNOSIS — Z20828 Contact with and (suspected) exposure to other viral communicable diseases: Secondary | ICD-10-CM | POA: Insufficient documentation

## 2018-11-26 LAB — SARS CORONAVIRUS 2 (TAT 6-24 HRS): SARS Coronavirus 2: NEGATIVE

## 2018-11-27 ENCOUNTER — Ambulatory Visit (HOSPITAL_COMMUNITY)
Admission: RE | Admit: 2018-11-27 | Discharge: 2018-11-27 | Disposition: A | Discharge status: Home / Self Care | Payer: PPO | Attending: Gastroenterology | Admitting: Gastroenterology

## 2018-11-27 ENCOUNTER — Encounter (HOSPITAL_COMMUNITY)
Admission: RE | Disposition: A | Discharge status: Home / Self Care | Payer: Self-pay | Source: Home / Self Care | Attending: Gastroenterology

## 2018-11-27 ENCOUNTER — Encounter (HOSPITAL_COMMUNITY): Payer: Self-pay | Admitting: *Deleted

## 2018-11-27 ENCOUNTER — Other Ambulatory Visit: Payer: Self-pay

## 2018-11-27 DIAGNOSIS — M1990 Unspecified osteoarthritis, unspecified site: Secondary | ICD-10-CM | POA: Diagnosis not present

## 2018-11-27 DIAGNOSIS — I10 Essential (primary) hypertension: Secondary | ICD-10-CM | POA: Diagnosis not present

## 2018-11-27 DIAGNOSIS — Z923 Personal history of irradiation: Secondary | ICD-10-CM | POA: Diagnosis not present

## 2018-11-27 DIAGNOSIS — Z853 Personal history of malignant neoplasm of breast: Secondary | ICD-10-CM | POA: Insufficient documentation

## 2018-11-27 DIAGNOSIS — Z860100 Personal history of colon polyps, unspecified: Secondary | ICD-10-CM

## 2018-11-27 DIAGNOSIS — K648 Other hemorrhoids: Secondary | ICD-10-CM | POA: Insufficient documentation

## 2018-11-27 DIAGNOSIS — D123 Benign neoplasm of transverse colon: Secondary | ICD-10-CM | POA: Diagnosis not present

## 2018-11-27 DIAGNOSIS — Z886 Allergy status to analgesic agent status: Secondary | ICD-10-CM | POA: Diagnosis not present

## 2018-11-27 DIAGNOSIS — Z7984 Long term (current) use of oral hypoglycemic drugs: Secondary | ICD-10-CM | POA: Insufficient documentation

## 2018-11-27 DIAGNOSIS — E785 Hyperlipidemia, unspecified: Secondary | ICD-10-CM | POA: Insufficient documentation

## 2018-11-27 DIAGNOSIS — Z87891 Personal history of nicotine dependence: Secondary | ICD-10-CM | POA: Diagnosis not present

## 2018-11-27 DIAGNOSIS — Q438 Other specified congenital malformations of intestine: Secondary | ICD-10-CM | POA: Diagnosis not present

## 2018-11-27 DIAGNOSIS — K644 Residual hemorrhoidal skin tags: Secondary | ICD-10-CM | POA: Insufficient documentation

## 2018-11-27 DIAGNOSIS — Z888 Allergy status to other drugs, medicaments and biological substances status: Secondary | ICD-10-CM | POA: Diagnosis not present

## 2018-11-27 DIAGNOSIS — D122 Benign neoplasm of ascending colon: Secondary | ICD-10-CM | POA: Insufficient documentation

## 2018-11-27 DIAGNOSIS — K635 Polyp of colon: Secondary | ICD-10-CM | POA: Diagnosis not present

## 2018-11-27 DIAGNOSIS — E1140 Type 2 diabetes mellitus with diabetic neuropathy, unspecified: Secondary | ICD-10-CM | POA: Diagnosis not present

## 2018-11-27 DIAGNOSIS — K5730 Diverticulosis of large intestine without perforation or abscess without bleeding: Secondary | ICD-10-CM | POA: Diagnosis not present

## 2018-11-27 HISTORY — PX: POLYPECTOMY: SHX5525

## 2018-11-27 HISTORY — PX: COLONOSCOPY: SHX5424

## 2018-11-27 LAB — GLUCOSE, CAPILLARY: Glucose-Capillary: 120 mg/dL — ABNORMAL HIGH (ref 70–99)

## 2018-11-27 SURGERY — COLONOSCOPY
Anesthesia: Moderate Sedation

## 2018-11-27 MED ORDER — STERILE WATER FOR IRRIGATION IR SOLN
Status: DC | PRN
  Administered 2018-11-27: 2.5 mL

## 2018-11-27 MED ORDER — MEPERIDINE HCL 100 MG/ML IJ SOLN
INTRAMUSCULAR | Status: DC | PRN
  Administered 2018-11-27 (×3): 25 mg via INTRAVENOUS

## 2018-11-27 MED ORDER — MEPERIDINE HCL 100 MG/ML IJ SOLN
INTRAMUSCULAR | Status: AC
  Filled 2018-11-27: qty 2

## 2018-11-27 MED ORDER — MIDAZOLAM HCL 5 MG/5ML IJ SOLN
INTRAMUSCULAR | Status: AC
  Filled 2018-11-27: qty 10

## 2018-11-27 MED ORDER — MIDAZOLAM HCL 5 MG/5ML IJ SOLN
INTRAMUSCULAR | Status: DC | PRN
  Administered 2018-11-27 (×2): 2 mg via INTRAVENOUS
  Administered 2018-11-27: 1 mg via INTRAVENOUS

## 2018-11-27 MED ORDER — SODIUM CHLORIDE (PF) 0.9 % IJ SOLN
PREFILLED_SYRINGE | INTRAMUSCULAR | Status: DC | PRN
  Administered 2018-11-27 (×3): 1 mL

## 2018-11-27 MED ORDER — SODIUM CHLORIDE 0.9 % IV SOLN
INTRAVENOUS | Status: DC
  Administered 2018-11-27: 08:00:00 via INTRAVENOUS

## 2018-11-27 NOTE — Discharge Instructions (Signed)
You have small internal hemorrhoids and diverticulosis IN YOUR LEFT AND RIGHT COLON. YOU HAD SIX POLYPS REMOVED. I PLACED A CLIP IN YOUR RIGHT COLON TO PREVENT BLEEDING IN 7-10 DAYS.    IF YOU NEED AN MRI, LET THE RADIOLOGY TECH KNOW THAT YOU HAD ONE CLIP PLACED IN YOUR COLON. IT SHOULD FALL OFF IN 30 DAYS.  DRINK WATER TO KEEP YOUR URINE LIGHT YELLOW.  EAT TO LIVE AND THINK OF FOOD AS MEDICINE. 75% OF YOUR PLATE SHOULD BE FRUITS/VEGGIES.  To have more energy, and to lose weight:      1. CONTINUE YOUR WEIGHT LOSS EFFORTS. I RECOMMEND YOU READ AND FOLLOW RECOMMENDATIONS BY DR. MARK HYMAN, "10-DAY DETOX DIET". YOUR BODY MASS INDEX IS OVER 30 WHICH MEANS YOU ARE OBESE. OBESITY IS ASSOCIATED WITH AN INCREASED FOR POLYPS, CIRRHOSIS, AND ALL CANCERS, INCLUDING ESOPHAGEAL AND COLON CANCER.     2. If you must eat bread, EAT EZEKIEL BREAD. IT IS IN THE FROZEN SECTION OF THE GROCERY STORE.    3. DRINK WATER WITH FRUIT OR CUCUMBER ADDED. YOUR URINE SHOULD BE LIGHT YELLOW. AVOID SODA, GATORADE, ENERGY DRINKS, OR DIET SODA.     4. AVOID HIGH FRUCTOSE CORN SYRUP AND CAFFEINE.     5. DO NOT chew SUGAR FREE GUM OR USE ARTIFICIAL SWEETENERS. IF NEEDED USE STEVIA AS A SWEETENER.    6. DO NOT EAT ENRICHED WHEAT FLOUR, PASTA, RICE, OR CEREAL.    7. ONLY EAT WILD CAUGHT SEAFOOD, GRASS FED BEEF OR CHICKEN, PORK FROM PASTURE RAISE PIGS, OR EGGS FROM PASTURE RAISED CHICKENS.    8. PRACTICE CHAIR YOGA FOR 15-30 MINS 3 OR 4 TIMES A WEEK AND PROGRESS TO HATHA YOGA OVER NEXT 6 MOS.    9. START TAKING A MULTIVITAMIN, AND VITAMIN B12 DAILY.   ADDITIONAL SUPPLEMENTS TO DECREASE CRAVING AND SUPPRESS YOUR APPETITE:    1. CINNAMON 500 MG EVERY AM PRIOR TO FIRST MEAL.   **STABILIZES BLOOD GLUCOSE**    2. CHROMIUM 400-500 MG WITH MEALS TWICE DAILY.    **FAT BURNER**    3. GREEN TEA EXTRACT ONE DAILY.   **SUPPRESSES YOUR APPETITE**   USE PREPARATION H FOUR TIMES  A DAY IF NEEDED TO RELIEVE RECTAL  PAIN/PRESSURE/BLEEDING.  YOUR BIOPSY RESULTS WILL BE BACK IN 5 BUSINESS DAYS.  We do not routinely screen for polyps after the age of 9, BUT WOULD SCHEDULE ANOTHER COLONOSCOPY IF ANY ADVANCED CHANGES ARE SEEN ON YOUR COLON POLYPS.   Colonoscopy Care After Read the instructions outlined below and refer to this sheet in the next week. These discharge instructions provide you with general information on caring for yourself after you leave the hospital. While your treatment has been planned according to the most current medical practices available, unavoidable complications occasionally occur. If you have any problems or questions after discharge, call DR. Dionel Archey, [phone].  ACTIVITY  You may resume your regular activity, but move at a slower pace for the next 24 hours.   Take frequent rest periods for the next 24 hours.   Walking will help get rid of the air and reduce the bloated feeling in your belly (abdomen).   No driving for 24 hours (because of the medicine (anesthesia) used during the test).   You may shower.   Do not sign any important legal documents or operate any machinery for 24 hours (because of the anesthesia used during the test).    NUTRITION  Drink plenty of fluids.   You may resume your  normal diet as instructed by your doctor.   Begin with a light meal and progress to your normal diet. Heavy or fried foods are harder to digest and may make you feel sick to your stomach (nauseated).   Avoid alcoholic beverages for 24 hours or as instructed.    MEDICATIONS  You may resume your normal medications.   WHAT YOU CAN EXPECT TODAY  Some feelings of bloating in the abdomen.   Passage of more gas than usual.   Spotting of blood in your stool or on the toilet paper  .  IF YOU HAD POLYPS REMOVED DURING THE COLONOSCOPY:  Eat a soft diet IF YOU HAVE NAUSEA, BLOATING, ABDOMINAL PAIN, OR VOMITING.    FINDING OUT THE RESULTS OF YOUR TEST Not all test  results are available during your visit. DR. Oneida Alar WILL CALL YOU WITHIN 14 DAYS OF YOUR PROCEDUE WITH YOUR RESULTS. Do not assume everything is normal if you have not heard from DR. Tykesha Konicki, CALL HER OFFICE AT [phone].  SEEK IMMEDIATE MEDICAL ATTENTION AND CALL THE OFFICE: [phone] IF:  You have more than a spotting of blood in your stool.   Your belly is swollen (abdominal distention).   You are nauseated or vomiting.   You have a temperature over 101F.   You have abdominal pain or discomfort that is severe or gets worse throughout the day.  High-Fiber Diet A high-fiber diet changes your normal diet to include more whole grains, legumes, fruits, and vegetables. Changes in the diet involve replacing refined carbohydrates with unrefined foods. The calorie level of the diet is essentially unchanged. The Dietary Reference Intake (recommended amount) for adult males is 38 grams per day. For adult females, it is 25 grams per day. Pregnant and lactating women should consume 28 grams of fiber per day. Fiber is the intact part of a plant that is not broken down during digestion. Functional fiber is fiber that has been isolated from the plant to provide a beneficial effect in the body.  PURPOSE  Increase stool bulk.   Ease and regulate bowel movements.   Lower cholesterol.   REDUCE RISK OF COLON CANCER  INDICATIONS THAT YOU NEED MORE FIBER  Constipation and hemorrhoids.   Uncomplicated diverticulosis (intestine condition) and irritable bowel syndrome.   Weight management.   As a protective measure against hardening of the arteries (atherosclerosis), diabetes, and cancer.   GUIDELINES FOR INCREASING FIBER IN THE DIET  Start adding fiber to the diet slowly. A gradual increase of about 5 more grams (2 servings of most fruits or vegetables) per day is best. Too rapid an increase in fiber may result in constipation, flatulence, and bloating.   Drink enough water and fluids to keep  your urine clear or pale yellow. Water, juice, or caffeine-free drinks are recommended. Not drinking enough fluid may cause constipation.   Eat a variety of high-fiber foods rather than one type of fiber.   Try to increase your intake of fiber through using high-fiber foods rather than fiber pills or supplements that contain small amounts of fiber.   The goal is to change the types of food eaten. Do not supplement your present diet with high-fiber foods, but replace foods in your present diet.   Colyps, Colon  A polyp is extra tissue that grows inside your body. Colon polyps grow in the large intestine. The large intestine, also called the colon, is part of your digestive system. It is a long, hollow tube at the end of  your digestive tract where your body makes and stores stool. Most polyps are not dangerous. They are benign. This means they are not cancerous. But over time, some types of polyps can turn into cancer. Polyps that are smaller than a pea are usually not harmful. But larger polyps could someday become or may already be cancerous. To be safe, doctors remove all polyps and test them.   PREVENTION There is not one sure way to prevent polyps. You might be able to lower your risk of getting them if you:  Eat more fruits and vegetables and less fatty food.   Do not smoke.   Avoid alcohol.   Exercise every day.   Lose weight if you are overweight.   Eating more calcium and folate can also lower your risk of getting polyps. Some foods that are rich in calcium are milk, cheese, and broccoli. Some foods that are rich in folate are chickpeas, kidney beans, and spinach.

## 2018-11-27 NOTE — H&P (Signed)
Primary Care Physician:  Fayrene Helper, MD Primary Gastroenterologist:  Dr. Oneida Alar  Pre-Procedure History & Physical: HPI:  Theresa Adkins is a 73 y.o. female here for  PERSONAL HISTORY OF POLYPS.  Past Medical History:  Diagnosis Date  . Allergic rhinitis, seasonal   . Arthritis   . Breast cancer [facility]) dx 02/ 2015---  oncologist-  dr shadad/ dr Lisbeth Renshaw   dx Right breast upper-outer quadrant DCIS, high grade (Tis N0), ER+, PR negative--- 07-01-2013  s/p  right breast lumpectomy w/ sln bxs and radiation therapy (08-24-2013 to 09-20-2013)  . Diabetes mellitus type II   . Diabetic neuropathy ([place])   . Heart murmur   . Hidradenitis axillaris    chronic  . History of adenomatous polyp of colon    03-04-2008  tubular adenoma  . History of radiation therapy 08-24-2013 to 09-20-2013   total 50Gy  . Hyperlipidemia   . Hypertension   . Personal history of radiation therapy   . Thickened endometrium   . Wears dentures    upper  . Wears glasses     Past Surgical History:  Procedure Laterality Date  . BREAST BIOPSY Right 04/16/2016  . BREAST LUMPECTOMY Right 08/2013  . BREAST LUMPECTOMY WITH NEEDLE LOCALIZATION AND AXILLARY SENTINEL LYMPH NODE BX Right 07/01/2013   Procedure: BREAST LUMPECTOMY WITH NEEDLE LOCALIZATION AND AXILLARY SENTINEL LYMPH NODE BX;  Surgeon: Merrie Roof, MD;  Location: [facility];  Service: General;  Laterality: Right;  . BREAST SURGERY  1970s   benign tumor removed from unspecified breast   . COLONOSCOPY W/ POLYPECTOMY  03/04/2008  . DILATATION & CURRETTAGE/HYSTEROSCOPY WITH RESECTOCOPE N/A 05/14/2016   Procedure: [facility];  Surgeon: Eldred Manges, MD;  Location: [facility];  Service: Gynecology;  Laterality: N/A;    Prior to Admission medications   Medication Sig Start Date End Date Taking? Authorizing Provider  amLODipine (NORVASC) 5 MG tablet Take 1 tablet by mouth once daily Patient taking  differently: Take 5 mg by mouth every evening.  10/02/18  Yes Fayrene Helper, MD  atorvastatin (LIPITOR) 10 MG tablet Take 1 tablet by mouth once daily Patient taking differently: Take 10 mg by mouth daily at 6 PM.  06/15/18  Yes Fayrene Helper, MD  bisoprolol-hydrochlorothiazide [facility]) 10-6.25 MG tablet Take 1 tablet by mouth once daily Patient taking differently: Take 1 tablet by mouth every evening.  07/20/18  Yes Fayrene Helper, MD  Cholecalciferol (VITAMIN D3) 50 MCG (2000 UT) CHEW Chew 4,000 Units by mouth daily.    Yes [provider]  metFORMIN (GLUCOPHAGE) 1000 MG tablet TAKE 1 TABLET BY MOUTH ONCE DAILY WITH A MEAL Patient taking differently: Take 1,000 mg by mouth every evening.  10/02/18  Yes Fayrene Helper, MD  UNABLE TO FIND Diabetic shoes x 1  Inserts x 3 pair  Dx e11.9 07/31/17   Fayrene Helper, MD    Allergies as of 10/19/2018 - Review Complete 10/14/2018  Allergen Reaction Noted  . Ace inhibitors Other (See Comments) 03/07/2010  . Aspirin Nausea Only 11/13/2012    Family History  Problem Relation Age of Onset  . Cancer Brother 65       bone   . Obesity Sister   . Heart disease Mother   . Cancer Sister 9       leukemia   . Obesity Sister     Social History   Socioeconomic History  . Marital status: Divorced  Spouse name: Not on file  . Number of children: 1  . Years of education: Not on file  . Highest education level: Not on file  Occupational History  . Occupation: Employed   Scientific laboratory technician  . Financial resource strain: Not hard at all  . Food insecurity    Worry: Never true    Inability: Never true  . Transportation needs    Medical: No    Non-medical: No  Tobacco Use  . Smoking status: Former Smoker    Packs/day: 1.00    Years: 25.00    Pack years: 25.00    Types: Cigarettes    Quit date: 02/17/2002    Years since quitting: 16.7  . Smokeless tobacco: Never Used  Substance and Sexual Activity  . Alcohol use: No   . Drug use: No  . Sexual activity: Never  Lifestyle  . Physical activity    Days per week: 0 days    Minutes per session: 0 min  . Stress: Not at all  Relationships  . Social connections    Talks on phone: More than three times a week    Gets together: Three times a week    Attends religious service: Never    Active member of club or organization: No    Attends meetings of clubs or organizations: Never    Relationship status: Divorced  . Intimate partner violence    Fear of current or ex partner: No    Emotionally abused: No    Physically abused: No    Forced sexual activity: No  Other Topics Concern  . Not on file  Social History Narrative  . Not on file    Review of Systems: See HPI, otherwise negative ROS   Physical Exam: BP (!) 113/57   Pulse 63   Temp (!) 97.5 F (36.4 C) (Oral)   Resp (!) 21   Ht 5\' 9"  (1.753 m)   Wt 115.7 kg   SpO2 95%   BMI 37.66 kg/m  General:   Alert,  pleasant and cooperative in NAD Head:  Normocephalic and atraumatic. Neck:  Supple; Lungs:  Clear throughout to auscultation.    Heart:  Regular rate and rhythm. Abdomen:  Soft, nontender and nondistended. Normal bowel sounds, without guarding, and without rebound.   Neurologic:  Alert and  oriented x4;  grossly normal neurologically.  Impression/Plan:      PERSONAL HISTORY OF POLYPS.  PLAN: 1. TCS TODAY. DISCUSSED PROCEDURE, BENEFITS, & RISKS: < 1% chance of medication reaction, bleeding, perforation, ASPIRATION, or rupture of spleen/liver requiring surgery to fix it and missed polyps < 1 cm 10-20% of the time.

## 2018-11-27 NOTE — Op Note (Signed)
[facility] Patient Name: Theresa Adkins Procedure Date: 11/27/2018 8:47 AM MRN: [id number] Date of Birth: 1945/08/25 Attending MD: Barney Drain MD, MD CSN: [id number] Age: 73 Admit Type: Outpatient Procedure:                Colonoscopy-COLD SNARE/SNARE CAUTERY POLYPECTOMY,                            SUBMUCOSAL INJECTION, CLIPx1 Indications:              Personal history of colonic polyps Providers:                Barney Drain MD, MD, Charlsie Quest. Theda Sers RN, RN,                            Raphael Gibney, Technician Referring MD:             Norwood Levo. Simpson MD, MD Medicines:                Meperidine 75 mg IV, Midazolam 5 mg IV Complications:            No immediate complications. Estimated Blood Loss:     Estimated blood loss was minimal. Procedure:                Pre-Anesthesia Assessment:                           - Prior to the procedure, a History and Physical                            was performed, and patient medications and                            allergies were reviewed. The patient's tolerance of                            previous anesthesia was also reviewed. The risks                            and benefits of the procedure and the sedation                            options and risks were discussed with the patient.                            All questions were answered, and informed consent                            was obtained. Prior Anticoagulants: The patient has                            taken no previous anticoagulant or antiplatelet                            agents except for aspirin. ASA Grade Assessment: II                            -  A patient with mild systemic disease. After                            reviewing the risks and benefits, the patient was                            deemed in satisfactory condition to undergo the                            procedure. After obtaining informed consent, the                            colonoscope was  passed under direct vision.                            Throughout the procedure, the patient's blood                            pressure, pulse, and oxygen saturations were                            monitored continuously. The [id number]-[id number]                            scope was introduced through the anus and advanced                            to the the cecum, identified by appendiceal orifice                            and ileocecal valve. The colonoscopy was performed                            with moderate difficulty due to a redundant colon                            and the patient's agitation. Successful completion                            of the procedure was aided by increasing the dose                            of sedation medication, straightening and                            shortening the scope to obtain bowel loop reduction                            and COLOWRAP. The patient tolerated the procedure                            fairly well. The quality of the bowel preparation  was excellent. The ileocecal valve, appendiceal                            orifice, and rectum were photographed. Scope In: 9:19:20 AM Scope Out: 9:59:29 AM Scope Withdrawal Time: 0 hours 33 minutes 23 seconds  Total Procedure Duration: 0 hours 40 minutes 9 seconds  Findings:      Three sessile and semi-pedunculated polyps were found in the proximal       transverse colon, hepatic flexure and ascending colon. The polyps were 8       to 13 mm in size. Area was successfully injected with 3 mL of a 1:10,000       solution of epinephrine for drug delivery. These polyps were removed       with a hot snare. Resection and retrieval were complete. To prevent       bleeding after the polypectomy, one hemostatic clip was successfully       placed (MR conditional). There was no bleeding at the end of the       procedure.      Three sessile polyps were found in the  transverse colon and ascending       colon. The polyps were 2 to 6 mm in size. These polyps were removed with       a cold snare. Resection and retrieval were complete.      Multiple small and large-mouthed diverticula were found in the       recto-sigmoid colon, sigmoid colon, descending colon and hepatic flexure.      The recto-sigmoid colon, sigmoid colon and descending colon were       moderately tortuous.      External and internal hemorrhoids were found. Impression:               - Three 8 to 13 mm polyps in the proximal                            transverse colon, at the hepatic flexure and in the                            ascending colon, removed with a hot snare. Resected                            and retrieved. Injected. Clip (MR conditional) was                            placed IN THE PROXIMAL ASCENDING COLON.                           - Three 2 to 6 mm polyps in the transverse colon(2)                            and in the ascending colon, removed with a cold                            snare. Resected and retrieved.                           -  Diverticulosis in the recto-sigmoid colon, in the                            sigmoid colon, in the descending colon and at the                            hepatic flexure.                           - Tortuous LEFT colon.                           - External and internal hemorrhoids. Moderate Sedation:      Moderate (conscious) sedation was administered by the endoscopy nurse       and supervised by the endoscopist. The following parameters were       monitored: oxygen saturation, heart rate, blood pressure, and response       to care. Total physician intraservice time was 50 minutes. Recommendation:           - Patient has a contact number available for                            emergencies. The signs and symptoms of potential                            delayed complications were discussed with the                            patient.  Return to normal activities tomorrow.                            Written discharge instructions were provided to the                            patient.                           - High fiber diet.                           - Continue present medications.                           - Await pathology results.                           - Repeat colonoscopy WITH PEDS COLONOSCOPE date to                            be determined after pending pathology results are                            reviewed for surveillance. Procedure Code(s):        --- Professional ---  [phone], Colonoscopy, flexible; with removal of                            tumor(s), polyp(s), or other lesion(s) by snare                            technique                           45381, Colonoscopy, flexible; with directed                            submucosal injection(s), any substance                           99153, Moderate sedation; each additional 15                            minutes intraservice time                           99153, Moderate sedation; each additional 15                            minutes intraservice time                           G0500, Moderate sedation services provided by the                            same physician or other qualified health care                            professional performing a gastrointestinal                            endoscopic service that sedation supports,                            requiring the presence of an independent trained                            observer to assist in the monitoring of the                            patient's level of consciousness and physiological                            status; initial 15 minutes of intra-service time;                            patient age 36 years or older (additional time may                            be reported with [phone], as appropriate) Diagnosis Code(s):        --- Professional ---  K63.5, Polyp of colon                           K64.8, Other hemorrhoids                           Z86.010, Personal history of colonic polyps                           K57.30, Diverticulosis of large intestine without                            perforation or abscess without bleeding                           Q43.8, Other specified congenital malformations of                            intestine CPT copyright 2019 [employer]. All rights reserved. The codes documented in this report are preliminary and upon coder review may  be revised to meet current compliance requirements. Barney Drain, MD Barney Drain MD, MD 11/27/2018 10:24:08 AM This report has been signed electronically. Number of Addenda: 0

## 2018-11-30 LAB — SURGICAL PATHOLOGY

## 2018-11-30 NOTE — Progress Notes (Signed)
cc'd to pcp 

## 2018-12-04 ENCOUNTER — Encounter (HOSPITAL_COMMUNITY): Payer: Self-pay | Admitting: Gastroenterology

## 2018-12-12 ENCOUNTER — Other Ambulatory Visit: Payer: Self-pay | Admitting: Family Medicine

## 2018-12-12 DIAGNOSIS — E785 Hyperlipidemia, unspecified: Secondary | ICD-10-CM

## 2018-12-15 DIAGNOSIS — E1159 Type 2 diabetes mellitus with other circulatory complications: Secondary | ICD-10-CM | POA: Diagnosis not present

## 2018-12-15 DIAGNOSIS — E1140 Type 2 diabetes mellitus with diabetic neuropathy, unspecified: Secondary | ICD-10-CM | POA: Diagnosis not present

## 2018-12-15 DIAGNOSIS — E785 Hyperlipidemia, unspecified: Secondary | ICD-10-CM | POA: Diagnosis not present

## 2018-12-15 DIAGNOSIS — I10 Essential (primary) hypertension: Secondary | ICD-10-CM | POA: Diagnosis not present

## 2018-12-16 LAB — COMPLETE METABOLIC PANEL WITH GFR
AG Ratio: 1.7 (calc) (ref 1.0–2.5)
ALT: 14 U/L (ref 6–29)
AST: 15 U/L (ref 10–35)
Albumin: 4.4 g/dL (ref 3.6–5.1)
Alkaline phosphatase (APISO): 50 U/L (ref 37–153)
BUN/Creatinine Ratio: 9 (calc) (ref 6–22)
BUN: 6 mg/dL — ABNORMAL LOW (ref 7–25)
CO2: 28 mmol/L (ref 20–32)
Calcium: 9.4 mg/dL (ref 8.6–10.4)
Chloride: 101 mmol/L (ref 98–110)
Creat: 0.69 mg/dL (ref 0.60–0.93)
GFR, Est African American: 100 mL/min/{1.73_m2} (ref >=60)
GFR, Est Non African American: 86 mL/min/{1.73_m2} (ref >=60)
Globulin: 2.6 g/dL (calc) (ref 1.9–3.7)
Glucose, Bld: 118 mg/dL — ABNORMAL HIGH (ref 65–99)
Potassium: 4.3 mmol/L (ref 3.5–5.3)
Sodium: 141 mmol/L (ref 135–146)
Total Bilirubin: 0.6 mg/dL (ref 0.2–1.2)
Total Protein: 7 g/dL (ref 6.1–8.1)

## 2018-12-16 LAB — LIPID PANEL
Cholesterol: 166 mg/dL (ref <200)
HDL: 47 mg/dL — ABNORMAL LOW (ref >=50)
LDL Cholesterol (Calc): 84 mg/dL (calc)
Non-HDL Cholesterol (Calc): 119 mg/dL (calc) (ref <130)
Total CHOL/HDL Ratio: 3.5 (calc) (ref <5.0)
Triglycerides: 263 mg/dL — ABNORMAL HIGH (ref <150)

## 2018-12-16 LAB — HEMOGLOBIN A1C
Hgb A1c MFr Bld: 6.9 % of total Hgb — ABNORMAL HIGH (ref <5.7)
Mean Plasma Glucose: 151 (calc)
eAG (mmol/L): 8.4 (calc)

## 2018-12-21 ENCOUNTER — Other Ambulatory Visit: Payer: Self-pay

## 2018-12-21 ENCOUNTER — Encounter: Payer: Self-pay | Admitting: Family Medicine

## 2018-12-21 ENCOUNTER — Ambulatory Visit (INDEPENDENT_AMBULATORY_CARE_PROVIDER_SITE_OTHER): Payer: PPO | Admitting: Family Medicine

## 2018-12-21 VITALS — BP 128/84 | HR 64 | Temp 97.8°F | Resp 15 | Ht 69.0 in | Wt 263.0 lb

## 2018-12-21 DIAGNOSIS — Z0000 Encounter for general adult medical examination without abnormal findings: Secondary | ICD-10-CM | POA: Diagnosis not present

## 2018-12-21 DIAGNOSIS — E1140 Type 2 diabetes mellitus with diabetic neuropathy, unspecified: Secondary | ICD-10-CM

## 2018-12-21 DIAGNOSIS — E785 Hyperlipidemia, unspecified: Secondary | ICD-10-CM

## 2018-12-21 DIAGNOSIS — E1159 Type 2 diabetes mellitus with other circulatory complications: Secondary | ICD-10-CM

## 2018-12-21 DIAGNOSIS — I10 Essential (primary) hypertension: Secondary | ICD-10-CM

## 2018-12-21 NOTE — Assessment & Plan Note (Signed)
[name] is reminded of the importance of commitment to daily physical activity for 30 minutes or more, as able and the need to limit carbohydrate intake to 30 to 60 grams per meal to help with blood sugar control.   The need to take medication as prescribed, test blood sugar as directed, and to call between visits if there is a concern that blood sugar is uncontrolled is also discussed.   [name] is reminded of the importance of daily foot exam, annual eye examination, and good blood sugar, blood pressure and cholesterol control.  Diabetic Labs Latest Ref Rng & Units 12/15/2018 08/03/2018 04/03/2018 12/26/2017 06/20/2017  HbA1c <5.7 % of total Hgb 6.9(H) 7.4(H) 7.3(H) 7.9(H) 7.5(H)  Microalbumin mg/dL - - - - 3.6  Micro/Creat Ratio <30 mcg/mg creat - - - - 18  Chol <200 mg/dL 166 156 150 165 141  HDL > OR = 50 mg/dL 47(L) 43(L) 40(L) 44(L) 44(L)  Calc LDL mg/dL (calc) 84 82 70 85 71  Triglycerides <150 mg/dL 263(H) 222(H) 331(H) 275(H) 184(H)  Creatinine 0.60 - 0.93 mg/dL 0.69 0.77 0.83 0.76 0.71   BP/Weight 12/21/2018 11/27/2018 09/25/2018 08/06/2018 04/09/2018 01/08/2018 [id number]  Systolic BP 0000000 [id number] 123456 123456 123456 - [id number]  Diastolic BP 84 63 78 78 78 - 76  Wt. (Lbs) 263 255 270 270 270 286 285.08  BMI 38.84 37.66 39.87 39.87 39.87 42.23 42.1   Foot/eye exam completion dates Latest Ref Rng & Units 12/21/2018 05/04/2018  Eye Exam No Retinopathy - No Retinopathy  Foot exam Order - - -  Foot Form Completion - Done -      Controlled, no change in medication Updated lab needed at/ before next visit.

## 2018-12-21 NOTE — Assessment & Plan Note (Signed)

## 2018-12-21 NOTE — Progress Notes (Signed)
    Theresa Adkins     MRN: [id number]      DOB: 03-02-1945  HPI: Patient is in for annual physical exam. No other health concerns are expressed or addressed at the visit. Recent labs, if available are reviewed. Immunization is reviewed , and  updated if needed.   PE: BP 128/84   Pulse 64   Temp 97.8 F (36.6 C) (Temporal)   Resp 15   Ht 5\' 9"  (1.753 m)   Wt 263 lb (119.3 kg)   SpO2 98%   BMI 38.84 kg/m   Pleasant  female, alert and oriented x 3, in no cardio-pulmonary distress. Afebrile. HEENT No facial trauma or asymetry. Sinuses non tender.  Extra occullar muscles intact.. External ears normal, . Neck: supple, no adenopathy,JVD or thyromegaly.No bruits.  Chest: Clear to ascultation bilaterally.No crackles or wheezes. Non tender to palpation  Breast: No asymetry,no masses or lumps. No tenderness. No nipple discharge or inversion. No axillary or supraclavicular adenopathy  Cardiovascular system; Heart sounds normal,  S1 and  S2 ,no S3.  No murmur, or thrill. Apical beat not displaced Peripheral pulses normal.  Abdomen: Soft, non tender, no organomegaly or masses. No bruits. Bowel sounds normal. No guarding, tenderness or rebound.   GU: External genitalia normal female genitalia , normal female distribution of hair. No lesions. Urethral meatus normal in size, no  Prolapse, no lesions visibly  Present. Bladder non tender. Vagina pink and moist , with no visible lesions , discharge present . Adequate pelvic support no  cystocele or rectocele noted Cervix pink and appears healthy, no lesions or ulcerations noted, no discharge noted from os Uterus normal size, no adnexal masses, no cervical motion or adnexal tenderness.   Musculoskeletal exam: Full ROM of spine, hips , shoulders and knees. No deformity ,swelling or crepitus noted. No muscle wasting or atrophy.   Neurologic: Cranial nerves 2 to 12 intact. Power, tone ,sensation and reflexes normal  throughout. No disturbance in gait. No tremor.  Skin: Intact, no ulceration, erythema , scaling or rash noted. Pigmentation normal throughout  Psych; Normal mood and affect. Judgement and concentration normal   Assessment & Plan:  No problem-specific Assessment & Plan notes found for this encounter.

## 2018-12-21 NOTE — Patient Instructions (Addendum)
F/U in 6 months, in office with MD re evaluate weight and chronic problems  Congrats on improved labs and 23 pound weight loss this past 1 year, keep it up!!  [place]  Today from office  Fasting lipid, cmp and EGFr, hBA1C in 5 months, and 3 weeks  Foot exam today qualifies you for diabetic shoes  It is important that you exercise regularly at least 30 minutes 5 times a week. If you develop chest pain, have severe difficulty breathing, or feel very tired, stop exercising immediately and seek medical attention   Think about what you will eat, plan ahead. Choose " clean, green, fresh or frozen" over canned, processed or packaged foods which are more sugary, salty and fatty. 70 to 75% of food eaten should be vegetables and fruit. Three meals at set times with snacks allowed between meals, but they must be fruit or vegetables. Aim to eat over a 12 hour period , example 7 am to 7 pm, and STOP after  your last meal of the day. Drink water,generally about 64 ounces per day, no other drink is as healthy. Fruit juice is best enjoyed in a healthy way, by EATING the fruit. Thanks for choosing [facility], we consider it a privelige to serve you.

## 2019-01-01 ENCOUNTER — Other Ambulatory Visit: Payer: Self-pay | Admitting: Family Medicine

## 2019-01-17 ENCOUNTER — Other Ambulatory Visit: Payer: Self-pay | Admitting: Family Medicine

## 2019-03-12 ENCOUNTER — Other Ambulatory Visit: Payer: Self-pay | Admitting: Family Medicine

## 2019-03-12 DIAGNOSIS — E785 Hyperlipidemia, unspecified: Secondary | ICD-10-CM

## 2019-03-17 ENCOUNTER — Other Ambulatory Visit: Payer: Self-pay | Admitting: Family Medicine

## 2019-03-17 DIAGNOSIS — Z1231 Encounter for screening mammogram for malignant neoplasm of breast: Secondary | ICD-10-CM

## 2019-04-04 ENCOUNTER — Other Ambulatory Visit: Payer: Self-pay | Admitting: Family Medicine

## 2019-04-11 ENCOUNTER — Ambulatory Visit: Payer: PPO | Attending: Internal Medicine

## 2019-04-11 DIAGNOSIS — Z23 Encounter for immunization: Secondary | ICD-10-CM | POA: Insufficient documentation

## 2019-04-11 NOTE — Progress Notes (Signed)
   Covid-19 Vaccination Clinic  Name:  Theresa Adkins    MRN: [id number] DOB: 1945-11-24  04/11/2019  Theresa Adkins was observed post Covid-19 immunization for 15 minutes without incidence. She was provided with Vaccine Information Sheet and instruction to access the V-Safe system.   Theresa Adkins was instructed to call 911 with any severe reactions post vaccine: Marland Kitchen Difficulty breathing  . Swelling of your face and throat  . A fast heartbeat  . A bad rash all over your body  . Dizziness and weakness    Immunizations Administered    Name Date Dose VIS Date Route   Pfizer COVID-19 Vaccine 04/11/2019  1:18 PM 0.3 mL 01/29/2019 Intramuscular   Manufacturer: [place]   Lot: [id number]   [place]: [id number]

## 2019-04-19 ENCOUNTER — Other Ambulatory Visit: Payer: Self-pay | Admitting: Family Medicine

## 2019-04-27 ENCOUNTER — Ambulatory Visit: Payer: PPO

## 2019-05-05 ENCOUNTER — Ambulatory Visit: Payer: PPO | Attending: Internal Medicine

## 2019-05-05 DIAGNOSIS — Z23 Encounter for immunization: Secondary | ICD-10-CM

## 2019-05-05 NOTE — Progress Notes (Signed)
   Covid-19 Vaccination Clinic  Name:  Theresa Adkins    MRN: [id number] DOB: September 27, 1945  05/05/2019  [name] was observed post Covid-19 immunization for 15 minutes without incident. She was provided with Vaccine Information Sheet and instruction to access the V-Safe system.   [name] was instructed to call 911 with any severe reactions post vaccine: Marland Kitchen Difficulty breathing  . Swelling of face and throat  . A fast heartbeat  . A bad rash all over body  . Dizziness and weakness   Immunizations Administered    Name Date Dose VIS Date Route   Pfizer COVID-19 Vaccine 05/05/2019  9:59 AM 0.3 mL 01/29/2019 Intramuscular   Manufacturer: [place]   Lot: [id number]   [place]: [id number]

## 2019-05-20 ENCOUNTER — Other Ambulatory Visit: Payer: Self-pay

## 2019-05-20 MED ORDER — UNABLE TO FIND: 0 refills | Status: DC

## 2019-06-09 ENCOUNTER — Other Ambulatory Visit: Payer: Self-pay | Admitting: Family Medicine

## 2019-06-09 DIAGNOSIS — E785 Hyperlipidemia, unspecified: Secondary | ICD-10-CM

## 2019-06-21 ENCOUNTER — Ambulatory Visit: Payer: PPO | Admitting: Family Medicine

## 2019-06-30 ENCOUNTER — Ambulatory Visit
Admission: RE | Admit: 2019-06-30 | Discharge: 2019-06-30 | Disposition: A | Discharge status: Home / Self Care | Payer: PPO | Source: Ambulatory Visit | Attending: Family Medicine | Admitting: Family Medicine

## 2019-06-30 ENCOUNTER — Other Ambulatory Visit: Payer: Self-pay

## 2019-06-30 DIAGNOSIS — Z1231 Encounter for screening mammogram for malignant neoplasm of breast: Secondary | ICD-10-CM | POA: Diagnosis not present

## 2019-07-01 ENCOUNTER — Telehealth: Payer: Self-pay | Admitting: Family Medicine

## 2019-07-01 DIAGNOSIS — E785 Hyperlipidemia, unspecified: Secondary | ICD-10-CM

## 2019-07-01 DIAGNOSIS — E1159 Type 2 diabetes mellitus with other circulatory complications: Secondary | ICD-10-CM

## 2019-07-01 DIAGNOSIS — E1140 Type 2 diabetes mellitus with diabetic neuropathy, unspecified: Secondary | ICD-10-CM

## 2019-07-01 DIAGNOSIS — E559 Vitamin D deficiency, unspecified: Secondary | ICD-10-CM

## 2019-07-01 NOTE — Telephone Encounter (Signed)
Labs ordered last October are outdated and need to be done, please contact her , re order and ask her to get them next week, thanks

## 2019-07-05 NOTE — Addendum Note (Signed)
Addended by: Eual Fines on: 07/05/2019 03:32 PM   Modules accepted: Orders

## 2019-07-09 ENCOUNTER — Telehealth: Payer: Self-pay

## 2019-07-09 NOTE — Telephone Encounter (Signed)
Pt LVM that she called twice with no response.  I called the pt back and LVM for her to return the call

## 2019-08-02 ENCOUNTER — Other Ambulatory Visit: Payer: Self-pay

## 2019-08-02 ENCOUNTER — Encounter: Payer: Self-pay | Admitting: Family Medicine

## 2019-08-02 ENCOUNTER — Ambulatory Visit (INDEPENDENT_AMBULATORY_CARE_PROVIDER_SITE_OTHER): Payer: PPO | Admitting: Family Medicine

## 2019-08-02 VITALS — BP 145/74 | HR 67 | Temp 98.4°F | Resp 18 | Ht 69.0 in | Wt 267.0 lb

## 2019-08-02 DIAGNOSIS — E785 Hyperlipidemia, unspecified: Secondary | ICD-10-CM | POA: Diagnosis not present

## 2019-08-02 DIAGNOSIS — E559 Vitamin D deficiency, unspecified: Secondary | ICD-10-CM

## 2019-08-02 DIAGNOSIS — I10 Essential (primary) hypertension: Secondary | ICD-10-CM

## 2019-08-02 DIAGNOSIS — E1159 Type 2 diabetes mellitus with other circulatory complications: Secondary | ICD-10-CM

## 2019-08-02 DIAGNOSIS — E1140 Type 2 diabetes mellitus with diabetic neuropathy, unspecified: Secondary | ICD-10-CM

## 2019-08-02 LAB — POCT GLYCOSYLATED HEMOGLOBIN (HGB A1C): Hemoglobin A1C: 7.7 % — AB (ref 4.0–5.6)

## 2019-08-02 NOTE — Assessment & Plan Note (Signed)
Uncontrolled, not at goal, will add amlodipine 2.5 mg to current if remains elevated DASH diet and commitment to daily physical activity for a minimum of 30 minutes discussed and encouraged, as a part of hypertension management. The importance of attaining a healthy weight is also discussed.  BP/Weight 08/02/2019 12/21/2018 11/27/2018 09/25/2018 08/06/2018 04/09/2018 18/59/0931  Systolic BP 121 624 469 507 225 750 -  Diastolic BP 74 84 63 78 78 78 -  Wt. (Lbs) 267 263 255 270 270 270 286  BMI 39.43 38.84 37.66 39.87 39.87 39.87 42.23

## 2019-08-02 NOTE — Assessment & Plan Note (Signed)
Obesity linked with hypertension and diabetes  Patient re-educated about  the importance of commitment to a  minimum of 150 minutes of exercise per week as able.  The importance of healthy food choices with portion control discussed, as well as eating regularly and within a 12 hour window most days. The need to choose "clean , green" food 50 to 75% of the time is discussed, as well as to make water the primary drink and set a goal of 64 ounces water daily.    Weight /BMI 08/02/2019 12/21/2018 11/27/2018  WEIGHT 267 lb 263 lb 255 lb  HEIGHT 5\' 9"  5\' 9"  5\' 9"   BMI 39.43 kg/m2 38.84 kg/m2 37.66 kg/m2

## 2019-08-02 NOTE — Patient Instructions (Addendum)
Please change MD f/u to Sept 18 or after, needs in house hBA1C at that visit. Call if you need me before  Need to work on dietary change and exercise to improve blood sugar which has increased  Foot exam qualifies you for shoes, please examine feet daily and wear good protective shoes all day  You are referred to Dr Gershon Crane for eye exam, please call for your appointment  Labs today cBC, lipid, cmp and eGFR, tSH, Vit D and microalb  It is important that you exercise regularly at least 30 minutes 5 times a week. If you develop chest pain, have severe difficulty breathing, or feel very tired, stop exercising immediately and seek medical attention  Think about what you will eat, plan ahead. Choose " clean, green, fresh or frozen" over canned, processed or packaged foods which are more sugary, salty and fatty. 70 to 75% of food eaten should be vegetables and fruit. Three meals at set times with snacks allowed between meals, but they must be fruit or vegetables. Aim to eat over a 12 hour period , example 7 am to 7 pm, and STOP after  your last meal of the day. Drink water,generally about 64 ounces per day, no other drink is as healthy. Fruit juice is best enjoyed in a healthy way, by EATING the fruit. Thanks for choosing [facility], we consider it a privelige to serve you.

## 2019-08-02 NOTE — Assessment & Plan Note (Addendum)
Uncontrolled, need to reduce starch, resists medication increase Theresa Adkins is reminded of the importance of commitment to daily physical activity for 30 minutes or more, as able and the need to limit carbohydrate intake to 30 to 60 grams per meal to help with blood sugar control.   The need to take medication as prescribed, test blood sugar as directed, and to call between visits if there is a concern that blood sugar is uncontrolled is also discussed.   Theresa Adkins is reminded of the importance of daily foot exam, annual eye examination, and good blood sugar, blood pressure and cholesterol control.  Diabetic Labs Latest Ref Rng & Units 08/02/2019 12/15/2018 08/03/2018 04/03/2018 12/26/2017  HbA1c 4.0 - 5.6 % 7.7(A) 6.9(H) 7.4(H) 7.3(H) 7.9(H)  Microalbumin mg/dL - - - - -  Micro/Creat Ratio <30 mcg/mg creat - - - - -  Chol <200 mg/dL - 166 156 150 165  HDL > OR = 50 mg/dL - 47(L) 43(L) 40(L) 44(L)  Calc LDL mg/dL (calc) - 84 82 70 85  Triglycerides <150 mg/dL - 263(H) 222(H) 331(H) 275(H)  Creatinine 0.60 - 0.93 mg/dL - 0.69 0.77 0.83 0.76   BP/Weight 08/02/2019 12/21/2018 11/27/2018 09/25/2018 08/06/2018 04/09/2018 34/14/4360  Systolic BP 165 800 634 949 447 395 -  Diastolic BP 74 84 63 78 78 78 -  Wt. (Lbs) 267 263 255 270 270 270 286  BMI 39.43 38.84 37.66 39.87 39.87 39.87 42.23   Foot/eye exam completion dates Latest Ref Rng & Units 08/02/2019 12/21/2018  Eye Exam No Retinopathy - -  Foot exam Order - - -  Foot Form Completion - Done Done      F/u in 3 months

## 2019-08-02 NOTE — Progress Notes (Signed)
Theresa Adkins     MRN: [id number]      DOB: 1945/10/26   HPI Theresa Adkins is here for follow up and re-evaluation of chronic medical conditions, medication management and review of any available recent lab and radiology data.  Preventive health is updated, specifically  Cancer screening and Immunization.   Questions or concerns regarding consultations or procedures which the PT has had in the interim are  addressed. The PT denies any adverse reactions to current medications since the last visit.  There are no new concerns.  There are no specific complaints , requests foot exam so she can get new diabetic shoes Denies polyuria, polydipsia, blurred vision , or hypoglycemic episodes. No regular exercise and diet is not well controlled, will work on this and resists medication increase  ROS Denies recent fever or chills. Denies sinus pressure, nasal congestion, ear pain or sore throat. Denies chest congestion, productive cough or wheezing. Denies chest pains, palpitations and leg swelling Denies abdominal pain, nausea, vomiting,diarrhea or constipation.   Denies dysuria, frequency, hesitancy or incontinence. Denies joint pain, swelling and limitation in mobility. Denies headaches, seizures, numbness, or tingling. Denies depression, anxiety or insomnia. Denies skin break down or rash.   PE  BP (!) 145/74 (BP Location: Left Arm, Patient Position: Sitting, Cuff Size: Normal)   Pulse 67   Temp 98.4 F (36.9 C) (Oral)   Resp 18   Ht 5\' 9"  (1.753 m)   Wt 267 lb (121.1 kg)   SpO2 94%   BMI 39.43 kg/m   Patient alert and oriented and in no cardiopulmonary distress.  HEENT: No facial asymmetry, EOMI,     Neck supple .  Chest: Clear to auscultation bilaterally.  CVS: S1, S2 no murmurs, no S3.Regular rate.  ABD: Soft non tender.   Ext: No edema  MS: Adequate though reduced  ROM spine, shoulders, hips and knees.  Skin: Intact, no ulcerations or rash noted.  Psych: Good eye  contact, normal affect. Memory intact not anxious or depressed appearing.  CNS: CN 2-12 intact, power,  normal throughout.no focal deficits noted.   Assessment & Plan  Controlled type 2 diabetes with neuropathy (HCC) Uncontrolled, need to reduce starch, resists medication increase Theresa Adkins is reminded of the importance of commitment to daily physical activity for 30 minutes or more, as able and the need to limit carbohydrate intake to 30 to 60 grams per meal to help with blood sugar control.   The need to take medication as prescribed, test blood sugar as directed, and to call between visits if there is a concern that blood sugar is uncontrolled is also discussed.   Theresa Adkins is reminded of the importance of daily foot exam, annual eye examination, and good blood sugar, blood pressure and cholesterol control.  Diabetic Labs Latest Ref Rng & Units 08/02/2019 12/15/2018 08/03/2018 04/03/2018 12/26/2017  HbA1c 4.0 - 5.6 % 7.7(A) 6.9(H) 7.4(H) 7.3(H) 7.9(H)  Microalbumin mg/dL - - - - -  Micro/Creat Ratio <30 mcg/mg creat - - - - -  Chol <200 mg/dL - 166 156 150 165  HDL > OR = 50 mg/dL - 47(L) 43(L) 40(L) 44(L)  Calc LDL mg/dL (calc) - 84 82 70 85  Triglycerides <150 mg/dL - 263(H) 222(H) 331(H) 275(H)  Creatinine 0.60 - 0.93 mg/dL - 0.69 0.77 0.83 0.76   BP/Weight 08/02/2019 12/21/2018 11/27/2018 09/25/2018 08/06/2018 04/09/2018 95/10/3265  Systolic BP 124 580 998 338 250 539 -  Diastolic BP 74 84 63 78  78 78 -  Wt. (Lbs) 267 263 255 270 270 270 286  BMI 39.43 38.84 37.66 39.87 39.87 39.87 42.23   Foot/eye exam completion dates Latest Ref Rng & Units 08/02/2019 12/21/2018  Eye Exam No Retinopathy - -  Foot exam Order - - -  Foot Form Completion - Done Done      F/u in 3 months  Hypertension associated with diabetes ([place]) Uncontrolled, not at goal, will add amlodipine 2.5 mg to current if remains elevated DASH diet and commitment to daily physical activity for a minimum of 30 minutes  discussed and encouraged, as a part of hypertension management. The importance of attaining a healthy weight is also discussed.  BP/Weight 08/02/2019 12/21/2018 11/27/2018 09/25/2018 08/06/2018 04/09/2018 45/40/9811  Systolic BP 914 782 956 213 086 578 -  Diastolic BP 74 84 63 78 78 78 -  Wt. (Lbs) 267 263 255 270 270 270 286  BMI 39.43 38.84 37.66 39.87 39.87 39.87 42.23       Morbid obesity (HCC) Obesity linked with hypertension and diabetes  Patient re-educated about  the importance of commitment to a  minimum of 150 minutes of exercise per week as able.  The importance of healthy food choices with portion control discussed, as well as eating regularly and within a 12 hour window most days. The need to choose "clean , green" food 50 to 75% of the time is discussed, as well as to make water the primary drink and set a goal of 64 ounces water daily.    Weight /BMI 08/02/2019 12/21/2018 11/27/2018  WEIGHT 267 lb 263 lb 255 lb  HEIGHT 5\' 9"  5\' 9"  5\' 9"   BMI 39.43 kg/m2 38.84 kg/m2 37.66 kg/m2

## 2019-08-16 DIAGNOSIS — H5213 Myopia, bilateral: Secondary | ICD-10-CM | POA: Diagnosis not present

## 2019-08-16 DIAGNOSIS — E1136 Type 2 diabetes mellitus with diabetic cataract: Secondary | ICD-10-CM | POA: Diagnosis not present

## 2019-08-16 DIAGNOSIS — Z7984 Long term (current) use of oral hypoglycemic drugs: Secondary | ICD-10-CM | POA: Diagnosis not present

## 2019-08-16 DIAGNOSIS — H52203 Unspecified astigmatism, bilateral: Secondary | ICD-10-CM | POA: Diagnosis not present

## 2019-08-16 DIAGNOSIS — H524 Presbyopia: Secondary | ICD-10-CM | POA: Diagnosis not present

## 2019-08-16 DIAGNOSIS — H25813 Combined forms of age-related cataract, bilateral: Secondary | ICD-10-CM | POA: Diagnosis not present

## 2019-08-16 LAB — HM DIABETES EYE EXAM

## 2019-08-24 ENCOUNTER — Telehealth: Payer: Self-pay

## 2019-08-24 NOTE — Telephone Encounter (Signed)
Patient needs to be seen.

## 2019-08-24 NOTE — Telephone Encounter (Signed)
LVM to set pt up an appt

## 2019-08-24 NOTE — Telephone Encounter (Signed)
Pt is calling she states she has a boil on her bottom area, that when she sits down it starts to bleed, what should she put on it, and does she need a visit to come in for it to be looked at

## 2019-08-25 ENCOUNTER — Encounter: Payer: Self-pay | Admitting: Family Medicine

## 2019-08-25 ENCOUNTER — Ambulatory Visit (INDEPENDENT_AMBULATORY_CARE_PROVIDER_SITE_OTHER): Payer: PPO | Admitting: Family Medicine

## 2019-08-25 ENCOUNTER — Other Ambulatory Visit: Payer: Self-pay

## 2019-08-25 VITALS — BP 154/81 | HR 86 | Resp 16 | Ht 69.0 in | Wt 279.0 lb

## 2019-08-25 DIAGNOSIS — E1140 Type 2 diabetes mellitus with diabetic neuropathy, unspecified: Secondary | ICD-10-CM | POA: Diagnosis not present

## 2019-08-25 DIAGNOSIS — I10 Essential (primary) hypertension: Secondary | ICD-10-CM

## 2019-08-25 DIAGNOSIS — E1159 Type 2 diabetes mellitus with other circulatory complications: Secondary | ICD-10-CM | POA: Diagnosis not present

## 2019-08-25 DIAGNOSIS — L03317 Cellulitis of buttock: Secondary | ICD-10-CM

## 2019-08-25 DIAGNOSIS — L0231 Cutaneous abscess of buttock: Secondary | ICD-10-CM | POA: Diagnosis not present

## 2019-08-25 MED ORDER — CEFTRIAXONE SODIUM 500 MG IJ SOLR
500.0000 mg | Freq: Once | INTRAMUSCULAR | Status: AC
  Administered 2019-08-25: 500 mg via INTRAMUSCULAR

## 2019-08-25 MED ORDER — CEPHALEXIN 500 MG PO CAPS
500.0000 mg | ORAL_CAPSULE | Freq: Three times a day (TID) | ORAL | 0 refills | Status: DC
Start: 2019-08-25 — End: 2019-09-29

## 2019-08-25 NOTE — Patient Instructions (Addendum)
F/u as before, call if you need me sooner  Rocephin 500 mg IM in office for abscess on buttock, and start taking the 10 day course of keflex by mouth today.   I anticipate that this will totally clear the infection over the next 7 to 10 days If persists or worsens , please call so I can refer you to general surgery for the abscess to be opened  You may do sitz baths for comfort, keep area clean also  Thanks for choosing [facility], we consider it a privelige to serve you.

## 2019-08-28 ENCOUNTER — Encounter: Payer: Self-pay | Admitting: Family Medicine

## 2019-08-28 NOTE — Assessment & Plan Note (Signed)
Controlled, no change in medication DASH diet and commitment to daily physical activity for a minimum of 30 minutes discussed and encouraged, as a part of hypertension management. The importance of attaining a healthy weight is also discussed.  BP/Weight 08/25/2019 08/02/2019 12/21/2018 11/27/2018 09/25/2018 08/06/2018 0/68/1661  Systolic BP 969 409 828 675 198 242 998  Diastolic BP 81 74 84 63 78 78 78  Wt. (Lbs) 279 267 263 255 270 270 270  BMI 41.2 39.43 38.84 37.66 39.87 39.87 39.87

## 2019-08-28 NOTE — Progress Notes (Signed)
Acute Office Visit  Subjective:    Patient ID: Theresa MCCAMY, female    DOB: 12-17-1945, 74 y.o.   MRN: [id number]  Chief Complaint  Patient presents with  . Recurrent Skin Infections    came up last week    HPI Patient is in today for 1 week h/o increasing pain and discomfort of buttock, development of an ab cess , which actually burst with bleeding 2 days ago. Symptoms have improved since but are still quite severe. She denies fever, chills, fatigue or appetite change Denies polyuria, polydipsia, blurred vision , or hypoglycemic episodes.   Past Medical History:  Diagnosis Date  . Allergic rhinitis, seasonal   . Arthritis   . Breast cancer [facility]) dx 02/ 2015---  oncologist-  dr shadad/ dr Lisbeth Renshaw   dx Right breast upper-outer quadrant DCIS, high grade (Tis N0), ER+, PR negative--- 07-01-2013  s/p  right breast lumpectomy w/ sln bxs and radiation therapy (08-24-2013 to 09-20-2013)  . Diabetes mellitus type II   . Diabetic neuropathy ([place])   . Heart murmur   . Hidradenitis axillaris    chronic  . History of adenomatous polyp of colon    03-04-2008  tubular adenoma  . History of radiation therapy 08-24-2013 to 09-20-2013   total 50Gy  . Hyperlipidemia   . Hypertension   . Personal history of radiation therapy   . PMB (postmenopausal bleeding) 05/14/2016  . Thickened endometrium   . Wears dentures    upper  . Wears glasses     Past Surgical History:  Procedure Laterality Date  . BREAST BIOPSY Right 04/16/2016  . BREAST LUMPECTOMY Right 08/2013  . BREAST LUMPECTOMY WITH NEEDLE LOCALIZATION AND AXILLARY SENTINEL LYMPH NODE BX Right 07/01/2013   Procedure: BREAST LUMPECTOMY WITH NEEDLE LOCALIZATION AND AXILLARY SENTINEL LYMPH NODE BX;  Surgeon: Merrie Roof, MD;  Location: [facility];  Service: General;  Laterality: Right;  . BREAST SURGERY  1970s   benign tumor removed from unspecified breast   . COLONOSCOPY N/A 11/27/2018   Procedure: COLONOSCOPY;  Surgeon: Danie Binder, MD;  [facility];  Service: Endoscopy;  Laterality: N/A;  8:30  . COLONOSCOPY W/ POLYPECTOMY  03/04/2008  . DILATATION & CURRETTAGE/HYSTEROSCOPY WITH RESECTOCOPE N/A 05/14/2016   Procedure: [place];  Surgeon: Eldred Manges, MD;  Location: [place];  Service: Gynecology;  Laterality: N/A;  . POLYPECTOMY  11/27/2018   Procedure: POLYPECTOMY;  Surgeon: Danie Binder, MD;  [facility];  Service: Endoscopy;;  colon    Family History  Problem Relation Age of Onset  . Cancer Brother 65       bone   . Obesity Sister   . Heart disease Mother   . Cancer Sister 9       leukemia   . Obesity Sister     Social History   Socioeconomic History  . Marital status: Divorced    Spouse name: Not on file  . Number of children: 1  . Years of education: Not on file  . Highest education level: Not on file  Occupational History  . Occupation: Employed   Tobacco Use  . Smoking status: Former Smoker    Packs/day: 1.00    Years: 25.00    Pack years: 25.00    Types: Cigarettes    Quit date: 02/17/2002    Years since quitting: 17.5  . Smokeless tobacco: Never Used  Substance and Sexual Activity  .  Alcohol use: No  . Drug use: No  . Sexual activity: Never  Other Topics Concern  . Not on file  Social History Narrative  . Not on file   Social Determinants of Health   Financial Resource Strain:   . Difficulty of Paying Living Expenses:   Food Insecurity:   . Worried About Charity fundraiser in the Last Year:   . Arboriculturist in the Last Year:   Transportation Needs:   . Film/video editor (Medical):   Marland Kitchen Lack of Transportation (Non-Medical):   Physical Activity:   . Days of Exercise per Week:   . Minutes of Exercise per Session:   Stress:   . Feeling of Stress :   Social Connections:   . Frequency of Communication with Friends and Family:   . Frequency of Social Gatherings with Friends  and Family:   . Attends Religious Services:   . Active Member of Clubs or Organizations:   . Attends Archivist Meetings:   Marland Kitchen Marital Status:   Intimate Partner Violence:   . Fear of Current or Ex-Partner:   . Emotionally Abused:   Marland Kitchen Physically Abused:   . Sexually Abused:     Outpatient Medications Prior to Visit  Medication Sig Dispense Refill  . amLODipine (NORVASC) 5 MG tablet Take 1 tablet by mouth once daily 90 tablet 1  . atorvastatin (LIPITOR) 10 MG tablet Take 1 tablet by mouth once daily 90 tablet 3  . bisoprolol-hydrochlorothiazide (ZIAC) 10-6.25 MG tablet Take 1 tablet by mouth once daily 90 tablet 3  . Cholecalciferol (VITAMIN D3) 50 MCG (2000 UT) CHEW Chew 4,000 Units by mouth daily.     . metFORMIN (GLUCOPHAGE) 1000 MG tablet TAKE 1 TABLET BY MOUTH ONCE DAILY WITH A MEAL 90 tablet 1  . Multiple Vitamin (MULTIVITAMIN) tablet Take 1 tablet by mouth daily.    Marland Kitchen UNABLE TO FIND Diabetic shoes x 1  Inserts x 3 pair  Dx e11.9 1 each 0  . vitamin B-12 (CYANOCOBALAMIN) 500 MCG tablet Take 500 mcg by mouth daily.     No facility-administered medications prior to visit.    Allergies  Allergen Reactions  . Ace Inhibitors Other (See Comments)    Cough  . Aspirin Nausea Only    Review of Systems See HPI No other concerns at this visit    Objective:    Physical Exam  BP (!) 154/81   Pulse 86   Resp 16   Ht 5\' 9"  (1.753 m)   Wt 279 lb (126.6 kg)   SpO2 97%   BMI 41.20 kg/m  Wt Readings from Last 3 Encounters:  08/25/19 279 lb (126.6 kg)  08/02/19 267 lb (121.1 kg)  12/21/18 263 lb (119.3 kg)  Chest: CTA bilaterally CVS: S1, S2 and no S3 , no murmur Abd: non tender Skin; abscess on left buttock, max diameter approx 5 cm, no erythema or drainage at time of exam, however tender and warm  There are no preventive care reminders to display for this patient.  There are no preventive care reminders to display for this patient.   Lab Results    Component Value Date   TSH 1.69 08/03/2018   Lab Results  Component Value Date   WBC 6.2 08/03/2018   HGB 11.8 08/03/2018   HCT 36.0 08/03/2018   MCV 80.2 08/03/2018   PLT 282 08/03/2018   Lab Results  Component Value Date   NA 141 12/15/2018  K 4.3 12/15/2018   CO2 28 12/15/2018   GLUCOSE 118 (H) 12/15/2018   BUN 6 (L) 12/15/2018   CREATININE 0.69 12/15/2018   BILITOT 0.6 12/15/2018   ALKPHOS 54 07/04/2016   AST 15 12/15/2018   ALT 14 12/15/2018   PROT 7.0 12/15/2018   ALBUMIN 4.5 07/04/2016   CALCIUM 9.4 12/15/2018   Lab Results  Component Value Date   CHOL 166 12/15/2018   Lab Results  Component Value Date   HDL 47 (L) 12/15/2018   Lab Results  Component Value Date   LDLCALC 84 12/15/2018   Lab Results  Component Value Date   TRIG 263 (H) 12/15/2018   Lab Results  Component Value Date   CHOLHDL 3.5 12/15/2018   Lab Results  Component Value Date   HGBA1C 7.7 (A) 08/02/2019       Assessment & Plan:  Cellulitis and abscess of buttock Rocephin 500 mg im in office followed by 10 day antibiotic course, if persists / worsens in 5 days , refer surgery , pt to call  Hypertension associated with diabetes ([place]) Controlled, no change in medication DASH diet and commitment to daily physical activity for a minimum of 30 minutes discussed and encouraged, as a part of hypertension management. The importance of attaining a healthy weight is also discussed.  BP/Weight 08/25/2019 08/02/2019 12/21/2018 11/27/2018 09/25/2018 08/06/2018 3/54/6568  Systolic BP 127 517 001 749 449 675 916  Diastolic BP 81 74 84 63 78 78 78  Wt. (Lbs) 279 267 263 255 270 270 270  BMI 41.2 39.43 38.84 37.66 39.87 39.87 39.87       Morbid obesity ([facility])  Patient re-educated about  the importance of commitment to a  minimum of 150 minutes of exercise per week as able.  The importance of healthy food choices with portion control discussed, as well as eating regularly and within a 12 hour  window most days. The need to choose "clean , green" food 50 to 75% of the time is discussed, as well as to make water the primary drink and set a goal of 64 ounces water daily.    Weight /BMI 08/25/2019 08/02/2019 12/21/2018  WEIGHT 279 lb 267 lb 263 lb  HEIGHT 5\' 9"  5\' 9"  5\' 9"   BMI 41.2 kg/m2 39.43 kg/m2 38.84 kg/m2      Controlled type 2 diabetes with neuropathy ([facility]) Controlled, no change in medication    Problem List Items Addressed This Visit      Other   Cellulitis and abscess of buttock - Primary       Meds ordered this encounter  Medications  . cephALEXin (KEFLEX) 500 MG capsule    Sig: Take 1 capsule (500 mg total) by mouth 3 (three) times daily.    Dispense:  30 capsule    Refill:  0  . cefTRIAXone (ROCEPHIN) injection 500 mg     [name], MD

## 2019-08-28 NOTE — Assessment & Plan Note (Signed)
Rocephin 500 mg im in office followed by 10 day antibiotic course, if persists / worsens in 5 days , refer surgery , pt to call

## 2019-08-28 NOTE — Assessment & Plan Note (Signed)
Controlled, no change in medication  

## 2019-08-28 NOTE — Assessment & Plan Note (Signed)
  Patient re-educated about  the importance of commitment to a  minimum of 150 minutes of exercise per week as able.  The importance of healthy food choices with portion control discussed, as well as eating regularly and within a 12 hour window most days. The need to choose "clean , green" food 50 to 75% of the time is discussed, as well as to make water the primary drink and set a goal of 64 ounces water daily.    Weight /BMI 08/25/2019 08/02/2019 12/21/2018  WEIGHT 279 lb 267 lb 263 lb  HEIGHT 5\' 9"  5\' 9"  5\' 9"   BMI 41.2 kg/m2 39.43 kg/m2 38.84 kg/m2

## 2019-09-03 ENCOUNTER — Other Ambulatory Visit: Payer: Self-pay

## 2019-09-03 MED ORDER — UNABLE TO FIND: 0 refills | Status: DC

## 2019-09-28 ENCOUNTER — Telehealth (INDEPENDENT_AMBULATORY_CARE_PROVIDER_SITE_OTHER): Payer: PPO | Admitting: Family Medicine

## 2019-09-28 ENCOUNTER — Encounter: Payer: Self-pay | Admitting: Family Medicine

## 2019-09-28 ENCOUNTER — Other Ambulatory Visit: Payer: Self-pay

## 2019-09-28 ENCOUNTER — Telehealth: Payer: PPO | Admitting: Family Medicine

## 2019-09-28 ENCOUNTER — Telehealth: Payer: Self-pay | Admitting: *Deleted

## 2019-09-28 VITALS — BP 154/81 | Ht 69.0 in | Wt 279.0 lb

## 2019-09-28 DIAGNOSIS — Z0000 Encounter for general adult medical examination without abnormal findings: Secondary | ICD-10-CM | POA: Diagnosis not present

## 2019-09-28 NOTE — Patient Instructions (Signed)
Theresa Adkins , Thank you for taking time to come for your Medicare Wellness Visit. I appreciate your ongoing commitment to your health goals. Please review the following plan we discussed and let me know if I can assist you in the future.   Please continue to practice social distancing to keep you, your family, and our community safe.  If you must go out, please wear a Mask and practice good handwashing.  Screening recommendations/referrals: Colonoscopy: up to date Mammogram: up to date Bone Density: up to date Recommended yearly ophthalmology/optometry visit for glaucoma screening and checkup Recommended yearly dental visit for hygiene and checkup  Vaccinations: up to date  Next appointment: 09/29/2019  Preventive Care 37 Years and Older, Female Preventive care refers to lifestyle choices and visits with your health care provider that can promote health and wellness. What does preventive care include?  A yearly physical exam. This is also called an annual well check.  Dental exams once or twice a year.  Routine eye exams. Ask your health care provider how often you should have your eyes checked.  Personal lifestyle choices, including:  Daily care of your teeth and gums.  Regular physical activity.  Eating a healthy diet.  Avoiding tobacco and drug use.  Limiting alcohol use.  Practicing safe sex.  Taking low-dose aspirin every day.  Taking vitamin and mineral supplements as recommended by your health care provider. What happens during an annual well check? The services and screenings done by your health care provider during your annual well check will depend on your age, overall health, lifestyle risk factors, and family history of disease. Counseling  Your health care provider may ask you questions about your:  Alcohol use.  Tobacco use.  Drug use.  Emotional well-being.  Home and relationship well-being.  Sexual activity.  Eating habits.  History of  falls.  Memory and ability to understand (cognition).  Work and work Statistician.  Reproductive health. Screening  You may have the following tests or measurements:  Height, weight, and BMI.  Blood pressure.  Lipid and cholesterol levels. These may be checked every 5 years, or more frequently if you are over 41 years old.  Skin check.  Lung cancer screening. You may have this screening every year starting at age 53 if you have a 30-pack-year history of smoking and currently smoke or have quit within the past 15 years.  Fecal occult blood test (FOBT) of the stool. You may have this test every year starting at age 67.  Flexible sigmoidoscopy or colonoscopy. You may have a sigmoidoscopy every 5 years or a colonoscopy every 10 years starting at age 15.  Hepatitis C blood test.  Hepatitis B blood test.  Sexually transmitted disease (STD) testing.  Diabetes screening. This is done by checking your blood sugar (glucose) after you have not eaten for a while (fasting). You may have this done every 1-3 years.  Bone density scan. This is done to screen for osteoporosis. You may have this done starting at age 97.  Mammogram. This may be done every 1-2 years. Talk to your health care provider about how often you should have regular mammograms. Talk with your health care provider about your test results, treatment options, and if necessary, the need for more tests. Vaccines  Your health care provider may recommend certain vaccines, such as:  Influenza vaccine. This is recommended every year.  Tetanus, diphtheria, and acellular pertussis (Tdap, Td) vaccine. You may need a Td booster every 10 years.  Zoster vaccine. You may need this after age 50.  Pneumococcal 13-valent conjugate (PCV13) vaccine. One dose is recommended after age 74.  Pneumococcal polysaccharide (PPSV23) vaccine. One dose is recommended after age 29. Talk to your health care provider about which screenings and  vaccines you need and how often you need them. This information is not intended to replace advice given to you by your health care provider. Make sure you discuss any questions you have with your health care provider. Document Released: 03/03/2015 Document Revised: 10/25/2015 Document Reviewed: 12/06/2014 Elsevier Interactive Patient Education  2017 [employer] Prevention in the Home Falls can cause injuries. They can happen to people of all ages. There are many things you can do to make your home safe and to help prevent falls. What can I do on the outside of my home?  Regularly fix the edges of walkways and driveways and fix any cracks.  Remove anything that might make you trip as you walk through a door, such as a raised step or threshold.  Trim any bushes or trees on the path to your home.  Use bright outdoor lighting.  Clear any walking paths of anything that might make someone trip, such as rocks or tools.  Regularly check to see if handrails are loose or broken. Make sure that both sides of any steps have handrails.  Any raised decks and porches should have guardrails on the edges.  Have any leaves, snow, or ice cleared regularly.  Use sand or salt on walking paths during winter.  Clean up any spills in your garage right away. This includes oil or grease spills. What can I do in the bathroom?  Use night lights.  Install grab bars by the toilet and in the tub and shower. Do not use towel bars as grab bars.  Use non-skid mats or decals in the tub or shower.  If you need to sit down in the shower, use a plastic, non-slip stool.  Keep the floor dry. Clean up any water that spills on the floor as soon as it happens.  Remove soap buildup in the tub or shower regularly.  Attach bath mats securely with double-sided non-slip rug tape.  Do not have throw rugs and other things on the floor that can make you trip. What can I do in the bedroom?  Use night  lights.  Make sure that you have a light by your bed that is easy to reach.  Do not use any sheets or blankets that are too big for your bed. They should not hang down onto the floor.  Have a firm chair that has side arms. You can use this for support while you get dressed.  Do not have throw rugs and other things on the floor that can make you trip. What can I do in the kitchen?  Clean up any spills right away.  Avoid walking on wet floors.  Keep items that you use a lot in easy-to-reach places.  If you need to reach something above you, use a strong step stool that has a grab bar.  Keep electrical cords out of the way.  Do not use floor polish or wax that makes floors slippery. If you must use wax, use non-skid floor wax.  Do not have throw rugs and other things on the floor that can make you trip. What can I do with my stairs?  Do not leave any items on the stairs.  Make sure that there are  handrails on both sides of the stairs and use them. Fix handrails that are broken or loose. Make sure that handrails are as long as the stairways.  Check any carpeting to make sure that it is firmly attached to the stairs. Fix any carpet that is loose or worn.  Avoid having throw rugs at the top or bottom of the stairs. If you do have throw rugs, attach them to the floor with carpet tape.  Make sure that you have a light switch at the top of the stairs and the bottom of the stairs. If you do not have them, ask someone to add them for you. What else can I do to help prevent falls?  Wear shoes that:  Do not have high heels.  Have rubber bottoms.  Are comfortable and fit you well.  Are closed at the toe. Do not wear sandals.  If you use a stepladder:  Make sure that it is fully opened. Do not climb a closed stepladder.  Make sure that both sides of the stepladder are locked into place.  Ask someone to hold it for you, if possible.  Clearly mark and make sure that you can  see:  Any grab bars or handrails.  First and last steps.  Where the edge of each step is.  Use tools that help you move around (mobility aids) if they are needed. These include:  Canes.  Walkers.  Scooters.  Crutches.  Turn on the lights when you go into a dark area. Replace any light bulbs as soon as they burn out.  Set up your furniture so you have a clear path. Avoid moving your furniture around.  If any of your floors are uneven, fix them.  If there are any pets around you, be aware of where they are.  Review your medicines with your doctor. Some medicines can make you feel dizzy. This can increase your chance of falling. Ask your doctor what other things that you can do to help prevent falls. This information is not intended to replace advice given to you by your health care provider. Make sure you discuss any questions you have with your health care provider. Document Released: 12/01/2008 Document Revised: 07/13/2015 Document Reviewed: 03/11/2014 Elsevier Interactive Patient Education  2017 [employer].

## 2019-09-28 NOTE — Telephone Encounter (Signed)
noted 

## 2019-09-28 NOTE — Progress Notes (Signed)
Subjective:   Theresa Adkins is a 74 y.o. female who presents for Medicare Annual (Subsequent) preventive examination.  Location of Patient: Home Location of Provider: Telephone Consent was obtain for visit to be over via telehealth.  I verified that I am speaking with the correct person using two identifiers.  Review of Systems    yes Cardiac Risk Factors include: none     Objective:    Today's Vitals   09/28/19 0845 09/28/19 0847  BP: (!) 154/81   Weight: 279 lb (126.6 kg)   Height: 5\' 9"  (1.753 m)   PainSc: 0-No pain 0-No pain   Body mass index is 41.2 kg/m.  Advanced Directives 09/28/2019 11/27/2018 09/22/2017 06/24/2016 05/14/2016 08/09/2013 06/25/2013  Does Patient Have a Medical Advance Directive? No No No No Yes Patient does not have advance directive;Patient would not like information Patient does not have advance directive;Patient would not like information  Type of Advance Directive - - - - Living will;Healthcare Power of Attorney - -  Does patient want to make changes to medical advance directive? - - - - No - Patient declined - -  Copy of Ewa Gentry in Chart? - - - - No - copy requested - -  Would patient like information on creating a medical advance directive? No - Patient declined No - Patient declined Yes (ED - Information included in AVS) No - Patient declined - - -    Current Medications (verified) Outpatient Encounter Medications as of 09/28/2019  Medication Sig  . amLODipine (NORVASC) 5 MG tablet Take 1 tablet by mouth once daily  . atorvastatin (LIPITOR) 10 MG tablet Take 1 tablet by mouth once daily  . bisoprolol-hydrochlorothiazide (ZIAC) 10-6.25 MG tablet Take 1 tablet by mouth once daily  . cephALEXin (KEFLEX) 500 MG capsule Take 1 capsule (500 mg total) by mouth 3 (three) times daily.  . Cholecalciferol (VITAMIN D3) 50 MCG (2000 UT) CHEW Chew 4,000 Units by mouth daily.   . metFORMIN (GLUCOPHAGE) 1000 MG tablet TAKE 1 TABLET BY MOUTH  ONCE DAILY WITH A MEAL  . Multiple Vitamin (MULTIVITAMIN) tablet Take 1 tablet by mouth daily.  Marland Kitchen UNABLE TO FIND Diabetic shoes x 1  Inserts x 3 pair  Dx e11.9  . vitamin B-12 (CYANOCOBALAMIN) 500 MCG tablet Take 500 mcg by mouth daily.   No facility-administered encounter medications on file as of 09/28/2019.    Allergies (verified) Ace inhibitors and Aspirin   History: Past Medical History:  Diagnosis Date  . Allergic rhinitis, seasonal   . Arthritis   . Breast cancer [facility]) dx 02/ 2015---  oncologist-  dr shadad/ dr Lisbeth Renshaw   dx Right breast upper-outer quadrant DCIS, high grade (Tis N0), ER+, PR negative--- 07-01-2013  s/p  right breast lumpectomy w/ sln bxs and radiation therapy (08-24-2013 to 09-20-2013)  . Diabetes mellitus type II   . Diabetic neuropathy ([place])   . Heart murmur   . Hidradenitis axillaris    chronic  . History of adenomatous polyp of colon    03-04-2008  tubular adenoma  . History of radiation therapy 08-24-2013 to 09-20-2013   total 50Gy  . Hyperlipidemia   . Hypertension   . Personal history of radiation therapy   . PMB (postmenopausal bleeding) 05/14/2016  . Thickened endometrium   . Wears dentures    upper  . Wears glasses    Past Surgical History:  Procedure Laterality Date  . BREAST BIOPSY Right 04/16/2016  . BREAST LUMPECTOMY  Right 08/2013  . BREAST LUMPECTOMY WITH NEEDLE LOCALIZATION AND AXILLARY SENTINEL LYMPH NODE BX Right 07/01/2013   Procedure: BREAST LUMPECTOMY WITH NEEDLE LOCALIZATION AND AXILLARY SENTINEL LYMPH NODE BX;  Surgeon: Merrie Roof, MD;  Location: [place];  Service: General;  Laterality: Right;  . BREAST SURGERY  1970s   benign tumor removed from unspecified breast   . COLONOSCOPY N/A 11/27/2018   Procedure: COLONOSCOPY;  Surgeon: Danie Binder, MD;  [facility];  Service: Endoscopy;  Laterality: N/A;  8:30  . COLONOSCOPY W/ POLYPECTOMY  03/04/2008  . DILATATION & CURRETTAGE/HYSTEROSCOPY WITH RESECTOCOPE N/A  05/14/2016   Procedure: [place];  Surgeon: Eldred Manges, MD;  Location: [place];  Service: Gynecology;  Laterality: N/A;  . POLYPECTOMY  11/27/2018   Procedure: POLYPECTOMY;  Surgeon: Danie Binder, MD;  [facility];  Service: Endoscopy;;  colon   Family History  Problem Relation Age of Onset  . Cancer Brother 65       bone   . Obesity Sister   . Heart disease Mother   . Cancer Sister 9       leukemia   . Obesity Sister    Social History   Socioeconomic History  . Marital status: Divorced    Spouse name: Not on file  . Number of children: 1  . Years of education: Not on file  . Highest education level: Not on file  Occupational History  . Occupation: Employed   Tobacco Use  . Smoking status: Former Smoker    Packs/day: 1.00    Years: 25.00    Pack years: 25.00    Types: Cigarettes    Quit date: 02/17/2002    Years since quitting: 17.6  . Smokeless tobacco: Never Used  Substance and Sexual Activity  . Alcohol use: No  . Drug use: No  . Sexual activity: Never  Other Topics Concern  . Not on file  Social History Narrative  . Not on file   Social Determinants of Health   Financial Resource Strain: Low Risk   . Difficulty of Paying Living Expenses: Not hard at all  Food Insecurity: No Food Insecurity  . Worried About Charity fundraiser in the Last Year: Never true  . Ran Out of Food in the Last Year: Never true  Transportation Needs: No Transportation Needs  . Lack of Transportation (Medical): No  . Lack of Transportation (Non-Medical): No  Physical Activity: Inactive  . Days of Exercise per Week: 0 days  . Minutes of Exercise per Session: 0 min  Stress: No Stress Concern Present  . Feeling of Stress : Not at all  Social Connections: Socially Isolated  . Frequency of Communication with Friends and Family: More than three times a week  . Frequency of Social Gatherings with Friends  and Family: More than three times a week  . Attends Religious Services: Never  . Active Member of Clubs or Organizations: No  . Attends Archivist Meetings: Never  . Marital Status: Divorced    Tobacco Counseling Counseling given: Not Answered   Clinical Intake:  Pre-visit preparation completed: No  Pain : No/denies pain Pain Score: 0-No pain     Nutritional Risks: None Diabetes: Yes CBG done?: No Did pt. bring in CBG monitor from home?: No  How often do you need to have someone help you when you read instructions, pamphlets, or other written materials from your doctor or  pharmacy?: 1 - Never What is the last grade level you completed in school?: Some college  Diabetic?yes  Interpreter Needed?: No      Activities of Daily Living In your present state of health, do you have any difficulty performing the following activities: 09/28/2019 12/21/2018  Hearing? N N  Vision? N N  Difficulty concentrating or making decisions? N N  Walking or climbing stairs? N N  Dressing or bathing? N N  Doing errands, shopping? N N  Preparing Food and eating ? N -  Using the Toilet? N -  In the past six months, have you accidently leaked urine? N -  Do you have problems with loss of bowel control? N -  Managing your Medications? N -  Managing your Finances? N -  Housekeeping or managing your Housekeeping? N -  Some recent data might be hidden    Patient Care Team: Fayrene Helper, MD as PCP - General Rothbart, Cristopher Estimable, MD (Inactive) (Cardiology) Jovita Kussmaul, MD as Consulting Physician (General Surgery) Rutherford Guys, MD as Consulting Physician (Ophthalmology)  Indicate any recent Medical Services you may have received from other than [facility] providers in the past year (date may be approximate).     Assessment:   This is a routine wellness examination for [place].  Hearing/Vision screen No exam data present  Dietary issues and exercise activities  discussed: Current Exercise Habits: Home exercise routine, Type of exercise: walking, Time (Minutes): 30, Frequency (Times/Week): 3, Weekly Exercise (Minutes/Week): 90, Intensity: Mild, Exercise limited by: None identified  Goals    . Exercise 3x per week (30 min per time)     Recommend starting a routine exercise program at least 3 days a week for 30-45 minutes at a time as tolerated.        Depression Screen PHQ 2/9 Scores 09/28/2019 08/25/2019 08/02/2019 09/25/2018 08/06/2018 04/09/2018 01/08/2018  PHQ - 2 Score 0 2 0 1 0 1 0  PHQ- 9 Score - 2 - - - 2 -    Fall Risk Fall Risk  09/28/2019 08/25/2019 08/02/2019 12/21/2018 09/25/2018  Falls in the past year? 0 0 0 0 1  Number falls in past yr: 0 - 0 0 0  Injury with Fall? 0 - 0 0 0  Risk for fall due to : No Fall Risks - No Fall Risks - -  Follow up Falls evaluation completed - Falls evaluation completed - -    Any stairs in or around the home? No  If so, are there any without handrails? No  Home free of loose throw rugs in walkways, pet beds, electrical cords, etc? Yes  Adequate lighting in your home to reduce risk of falls? Yes   ASSISTIVE DEVICES UTILIZED TO PREVENT FALLS:  Life alert? No  Use of a cane, walker or w/c? No  Grab bars in the bathroom? Yes  Shower chair or bench in shower? No  Elevated toilet seat or a handicapped toilet? No   TIMED UP AND GO:  Was the test performed? No .  Length of time to ambulate 10 feet: NA sec.     Cognitive Function: MMSE - Mini Mental State Exam 04/28/2014  Orientation to time 5  Orientation to Place 5  Registration 3  Attention/ Calculation 5  Recall 3  Language- name 2 objects 2  Language- repeat 1  Language- follow 3 step command 3  Language- read & follow direction 1  Write a sentence 1  Copy design 1  Total  score 30     6CIT Screen 09/28/2019 09/25/2018 09/22/2017 06/24/2016  What Year? 0 points 0 points 0 points 0 points  What month? 0 points 0 points 0 points 0 points  What  time? 0 points 0 points 0 points 0 points  Count back from 20 0 points 0 points 0 points 0 points  Months in reverse 0 points 0 points 0 points 0 points  Repeat phrase 0 points 0 points 0 points 0 points  Total Score 0 0 0 0    Immunizations Immunization History  Administered Date(s) Administered  . Influenza Split 01/13/2012, 11/10/2018  . Influenza,inj,Quad PF,6+ Mos 11/13/2012, 11/25/2013, 11/01/2014, 12/21/2015, 12/03/2016, 12/05/2017  . PFIZER SARS-COV-2 Vaccination 04/11/2019, 05/05/2019  . Pneumococcal Conjugate-13 12/29/2014  . Pneumococcal Polysaccharide-23 01/19/2016  . Tdap 06/05/2010  . Zoster 06/05/2010    TDAP status: Up to date Flu Vaccine status: Up to date Pneumococcal vaccine status: Up to date Covid-19 vaccine status: Completed vaccines  Qualifies for Shingles Vaccine? Yes   Zostavax completed Yes   Shingrix Completed?: Yes  Screening Tests Health Maintenance  Topic Date Due  . INFLUENZA VACCINE  09/19/2019  . HEMOGLOBIN A1C  02/01/2020  . TETANUS/TDAP  06/04/2020  . FOOT EXAM  08/01/2020  . OPHTHALMOLOGY EXAM  08/15/2020  . MAMMOGRAM  06/29/2021  . COLONOSCOPY  11/26/2028  . DEXA SCAN  Completed  . COVID-19 Vaccine  Completed  . Hepatitis C Screening  Completed  . PNA vac Low Risk Adult  Completed    Health Maintenance  Health Maintenance Due  Topic Date Due  . INFLUENZA VACCINE  09/19/2019    Colorectal cancer screening: Completed 11-27-18. Repeat every 10 years Mammogram status: Completed 06-30-19. Repeat every year Bone Density status: Completed 05-05-2014. Results reflect: Bone density results: NORMAL. Repeat every 5 years.  Lung Cancer Screening: (Low Dose CT Chest recommended if Age 36-80 years, 30 pack-year currently smoking OR have quit w/in 15years.) does not qualify.   Lung Cancer Screening Referral: no  Additional Screening:  Hepatitis C Screening: does not qualify;   Vision Screening: Recommended annual ophthalmology exams  for early detection of glaucoma and other disorders of the eye. Is the patient up to date with their annual eye exam?  Yes  Who is the provider or what is the name of the office in which the patient attends annual eye exams? [facility] If pt is not established with a provider, would they like to be referred to a provider to establish care? No .   Dental Screening: Recommended annual dental exams for proper oral hygiene  Community Resource Referral / Chronic Care Management: CRR required this visit?  No   CCM required this visit?  No      Plan:      1. Encounter for Medicare annual wellness exam   I have personally reviewed and noted the following in the patient's chart:   . Medical and social history . Use of alcohol, tobacco or illicit drugs  . Current medications and supplements . Functional ability and status . Nutritional status . Physical activity . Advanced directives . List of other physicians . Hospitalizations, surgeries, and ER visits in previous 12 months . Vitals . Screenings to include cognitive, depression, and falls . Referrals and appointments  In addition, I have reviewed and discussed with patient certain preventive protocols, quality metrics, and best practice recommendations. A written personalized care plan for preventive services as well as general preventive health recommendations were provided to patient.  Perlie Mayo, NP   09/28/2019   I provided 25 minutes of non-face-to-face time during this encounter.

## 2019-09-28 NOTE — Telephone Encounter (Signed)
FYI  Pt was called for awv this am mentioned that she has been having some tingling on both sides of her arms mainly when she lays down if she lays on left its tingling in the right if she lays on right its tingling in the left. Stated that this had been going on for around 2 weeks coming and going. Said she mentioned this at last visit and was told arthritis. I advised pt to go to er or urgent care to be evaluated due to the tingling in the arms. She would rather have an appointment with dr Moshe Cipro. I put her in for 9am first available in the am. I advised pt if the tingling got worse or there was any chest pain sob or sweats to call 911 and go to er.

## 2019-09-29 ENCOUNTER — Other Ambulatory Visit: Payer: Self-pay

## 2019-09-29 ENCOUNTER — Ambulatory Visit (HOSPITAL_COMMUNITY)
Admission: RE | Admit: 2019-09-29 | Discharge: 2019-09-29 | Disposition: A | Discharge status: Home / Self Care | Payer: PPO | Source: Ambulatory Visit | Attending: Family Medicine | Admitting: Family Medicine

## 2019-09-29 ENCOUNTER — Ambulatory Visit (INDEPENDENT_AMBULATORY_CARE_PROVIDER_SITE_OTHER): Payer: PPO | Admitting: Family Medicine

## 2019-09-29 ENCOUNTER — Encounter: Payer: Self-pay | Admitting: Family Medicine

## 2019-09-29 VITALS — BP 148/85 | HR 67 | Resp 16 | Ht 69.0 in | Wt 278.0 lb

## 2019-09-29 DIAGNOSIS — E785 Hyperlipidemia, unspecified: Secondary | ICD-10-CM

## 2019-09-29 DIAGNOSIS — R200 Anesthesia of skin: Secondary | ICD-10-CM | POA: Diagnosis not present

## 2019-09-29 DIAGNOSIS — R29898 Other symptoms and signs involving the musculoskeletal system: Secondary | ICD-10-CM

## 2019-09-29 DIAGNOSIS — M542 Cervicalgia: Secondary | ICD-10-CM | POA: Diagnosis not present

## 2019-09-29 DIAGNOSIS — I10 Essential (primary) hypertension: Secondary | ICD-10-CM | POA: Diagnosis not present

## 2019-09-29 DIAGNOSIS — M5442 Lumbago with sciatica, left side: Secondary | ICD-10-CM | POA: Insufficient documentation

## 2019-09-29 DIAGNOSIS — G8929 Other chronic pain: Secondary | ICD-10-CM | POA: Insufficient documentation

## 2019-09-29 DIAGNOSIS — E1159 Type 2 diabetes mellitus with other circulatory complications: Secondary | ICD-10-CM | POA: Diagnosis not present

## 2019-09-29 DIAGNOSIS — M545 Low back pain: Secondary | ICD-10-CM | POA: Diagnosis not present

## 2019-09-29 DIAGNOSIS — E1140 Type 2 diabetes mellitus with diabetic neuropathy, unspecified: Secondary | ICD-10-CM | POA: Diagnosis not present

## 2019-09-29 MED ORDER — GABAPENTIN 300 MG PO CAPS: 300.0000 mg | ORAL_CAPSULE | Freq: Every day | ORAL | 3 refills | Status: DC

## 2019-09-29 MED ORDER — AMLODIPINE BESYLATE 2.5 MG PO TABS
2.5000 mg | ORAL_TABLET | Freq: Every day | ORAL | 3 refills | Status: DC
Start: 2019-09-29 — End: 2019-12-13

## 2019-09-29 NOTE — Patient Instructions (Addendum)
F/U in 4 to 5  weeks with MD , re evaluate blood pressure and symptomscall if you need me before  You need and X ray of your neck and low back today  I am referring you for an open MRI of neck and brain  Start aspirin 325 mg one daily until we get results of brain scan  New is gabapentin 300 mg one at night for pain   Additional BP med is amlodipine 2.5 mg for blood pressure , continue amlodipine 5 mg and Ziac as before  Thanks for choosing [facility], we consider it a privelige to serve you.

## 2019-10-01 ENCOUNTER — Other Ambulatory Visit: Payer: Self-pay

## 2019-10-01 DIAGNOSIS — K8020 Calculus of gallbladder without cholecystitis without obstruction: Secondary | ICD-10-CM

## 2019-10-02 ENCOUNTER — Encounter: Payer: Self-pay | Admitting: Family Medicine

## 2019-10-02 DIAGNOSIS — M542 Cervicalgia: Secondary | ICD-10-CM | POA: Insufficient documentation

## 2019-10-02 DIAGNOSIS — G8929 Other chronic pain: Secondary | ICD-10-CM | POA: Insufficient documentation

## 2019-10-02 DIAGNOSIS — R29898 Other symptoms and signs involving the musculoskeletal system: Secondary | ICD-10-CM | POA: Insufficient documentation

## 2019-10-02 NOTE — Progress Notes (Signed)
Theresa Adkins     MRN: [id number]      DOB: 08-03-1945   HPI Theresa Adkins is here with a 2 week h/o RUE and RLE numbness and weakness , upper arm more than leg. Also c/o head and neck pain . No inciting trauma  ROS Denies recent fever or chills. Denies sinus pressure, nasal congestion, ear pain or sore throat. Denies chest congestion, productive cough or wheezing. Denies chest pains, palpitations and leg swelling Denies abdominal pain, nausea, vomiting,diarrhea or constipation.   Denies dysuria, frequency, hesitancy or incontinence.  Denies depression, anxiety or insomnia. Denies skin break down or rash.   PE  BP (!) 148/85   Pulse 67   Resp 16   Ht 5\' 9"  (1.753 m)   Wt 278 lb (126.1 kg)   SpO2 96%   BMI 41.05 kg/m   Patient alert and oriented and in no cardiopulmonary distress.  HEENT: No facial asymmetry, EOMI,     Neck decreased ROM .  Chest: Clear to auscultation bilaterally.  CVS: S1, S2 no murmurs, no S3.Regular rate.  ABD: Soft non tender.   Ext: No edema  MS: decreased  ROM lumbar spine,adequate in  shoulders, hips and knees.  Skin: Intact, no ulcerations or rash noted.  Psych: Good eye contact, normal affect. Memory intact not anxious or depressed appearing.  CNS: CN 2-12 intact, grade 4 power in RUE, grade 5 power in the other 3 extremities, reduced sensation in RUE  Assessment & Plan  Neck pain 2 week h/o RUE pain and weakness,no inciting trauma, obtain X ray C spine  Right arm weakness 2 week h/o acute RIUE weakness, exam c/w weakness and  Reduced sensatio in RUE, obtain brain scan and start aspirin 325 mg daily ED if new or worsening symptoms  Hypertension associated with diabetes ([place]) Uncontrolled, add amlodipine 2.5 mg to amlodipine 5 mg and continue ziac DASH diet and commitment to daily physical activity for a minimum of 30 minutes discussed and encouraged, as a part of hypertension management. The importance of attaining a healthy weight  is also discussed.  BP/Weight 09/29/2019 09/28/2019 08/25/2019 08/02/2019 12/21/2018 32/07/7122 06/25/996  Systolic BP 338 250 539 767 341 937 902  Diastolic BP 85 81 81 74 84 63 78  Wt. (Lbs) 278 279 279 267 263 255 270  BMI 41.05 41.2 41.2 39.43 38.84 37.66 39.87       Controlled type 2 diabetes with neuropathy [facility]) Theresa Adkins is reminded of the importance of commitment to daily physical activity for 30 minutes or more, as able and the need to limit carbohydrate intake to 30 to 60 grams per meal to help with blood sugar control.   The need to take medication as prescribed, test blood sugar as directed, and to call between visits if there is a concern that blood sugar is uncontrolled is also discussed.   Theresa Adkins is reminded of the importance of daily foot exam, annual eye examination, and good blood sugar, blood pressure and cholesterol control. Less well controlled  Diabetic Labs Latest Ref Rng & Units 08/02/2019 12/15/2018 08/03/2018 04/03/2018 12/26/2017  HbA1c 4.0 - 5.6 % 7.7(A) 6.9(H) 7.4(H) 7.3(H) 7.9(H)  Microalbumin mg/dL - - - - -  Micro/Creat Ratio <30 mcg/mg creat - - - - -  Chol <200 mg/dL - 166 156 150 165  HDL > OR = 50 mg/dL - 47(L) 43(L) 40(L) 44(L)  Calc LDL mg/dL (calc) - 84 82 70 85  Triglycerides <150 mg/dL -  263(H) 222(H) 331(H) 275(H)  Creatinine 0.60 - 0.93 mg/dL - 0.69 0.77 0.83 0.76   BP/Weight 09/29/2019 09/28/2019 08/25/2019 08/02/2019 12/21/2018 70/10/2955 05/25/3401  Systolic BP 709 643 838 184 037 543 606  Diastolic BP 85 81 81 74 84 63 78  Wt. (Lbs) 278 279 279 267 263 255 270  BMI 41.05 41.2 41.2 39.43 38.84 37.66 39.87   Foot/eye exam completion dates Latest Ref Rng & Units 08/16/2019 08/02/2019  Eye Exam No Retinopathy No Retinopathy -  Foot exam Order - - -  Foot Form Completion - - Done        Hyperlipidemia Hyperlipidemia:Low fat diet discussed and encouraged.   Lipid Panel  Lab Results  Component Value Date   CHOL 166 12/15/2018   HDL 47 (L)  12/15/2018   LDLCALC 84 12/15/2018   TRIG 263 (H) 12/15/2018   CHOLHDL 3.5 12/15/2018   TG are too high needs to reduce fried and fatty foods

## 2019-10-02 NOTE — Assessment & Plan Note (Signed)
Hyperlipidemia:Low fat diet discussed and encouraged.   Lipid Panel  Lab Results  Component Value Date   CHOL 166 12/15/2018   HDL 47 (L) 12/15/2018   LDLCALC 84 12/15/2018   TRIG 263 (H) 12/15/2018   CHOLHDL 3.5 12/15/2018   TG are too high needs to reduce fried and fatty foods

## 2019-10-02 NOTE — Assessment & Plan Note (Signed)
Theresa Adkins is reminded of the importance of commitment to daily physical activity for 30 minutes or more, as able and the need to limit carbohydrate intake to 30 to 60 grams per meal to help with blood sugar control.   The need to take medication as prescribed, test blood sugar as directed, and to call between visits if there is a concern that blood sugar is uncontrolled is also discussed.   Theresa Adkins is reminded of the importance of daily foot exam, annual eye examination, and good blood sugar, blood pressure and cholesterol control. Less well controlled  Diabetic Labs Latest Ref Rng & Units 08/02/2019 12/15/2018 08/03/2018 04/03/2018 12/26/2017  HbA1c 4.0 - 5.6 % 7.7(A) 6.9(H) 7.4(H) 7.3(H) 7.9(H)  Microalbumin mg/dL - - - - -  Micro/Creat Ratio <30 mcg/mg creat - - - - -  Chol <200 mg/dL - 166 156 150 165  HDL > OR = 50 mg/dL - 47(L) 43(L) 40(L) 44(L)  Calc LDL mg/dL (calc) - 84 82 70 85  Triglycerides <150 mg/dL - 263(H) 222(H) 331(H) 275(H)  Creatinine 0.60 - 0.93 mg/dL - 0.69 0.77 0.83 0.76   BP/Weight 09/29/2019 09/28/2019 08/25/2019 08/02/2019 12/21/2018 06/19/1019 02/18/7354  Systolic BP 701 410 301 314 388 875 797  Diastolic BP 85 81 81 74 84 63 78  Wt. (Lbs) 278 279 279 267 263 255 270  BMI 41.05 41.2 41.2 39.43 38.84 37.66 39.87   Foot/eye exam completion dates Latest Ref Rng & Units 08/16/2019 08/02/2019  Eye Exam No Retinopathy No Retinopathy -  Foot exam Order - - -  Foot Form Completion - - Done

## 2019-10-02 NOTE — Assessment & Plan Note (Signed)
2 week h/o RUE pain and weakness,no inciting trauma, obtain X ray C spine

## 2019-10-02 NOTE — Assessment & Plan Note (Signed)
Uncontrolled, add amlodipine 2.5 mg to amlodipine 5 mg and continue ziac DASH diet and commitment to daily physical activity for a minimum of 30 minutes discussed and encouraged, as a part of hypertension management. The importance of attaining a healthy weight is also discussed.  BP/Weight 09/29/2019 09/28/2019 08/25/2019 08/02/2019 12/21/2018 67/0/1410 3/0/1314  Systolic BP 388 875 797 282 060 156 153  Diastolic BP 85 81 81 74 84 63 78  Wt. (Lbs) 278 279 279 267 263 255 270  BMI 41.05 41.2 41.2 39.43 38.84 37.66 39.87

## 2019-10-02 NOTE — Assessment & Plan Note (Signed)
2 week h/o acute RIUE weakness, exam c/w weakness and  Reduced sensatio in RUE, obtain brain scan and start aspirin 325 mg daily ED if new or worsening symptoms

## 2019-10-03 ENCOUNTER — Other Ambulatory Visit: Payer: Self-pay | Admitting: Family Medicine

## 2019-10-07 NOTE — Addendum Note (Signed)
Addended by: Eual Fines on: 10/07/2019 03:17 PM   Modules accepted: Orders

## 2019-10-19 ENCOUNTER — Ambulatory Visit (HOSPITAL_COMMUNITY): Payer: PPO

## 2019-10-20 ENCOUNTER — Ambulatory Visit (HOSPITAL_COMMUNITY): Admission: RE | Admit: 2019-10-20 | Payer: PPO | Source: Ambulatory Visit

## 2019-10-26 ENCOUNTER — Ambulatory Visit: Payer: PPO | Admitting: Family Medicine

## 2019-10-27 ENCOUNTER — Telehealth: Payer: Self-pay | Admitting: Family Medicine

## 2019-10-27 NOTE — Telephone Encounter (Signed)
Theresa Adkins with the preservice center called in regards to a mri scheduled for the patient she stated we had to wrong place on the referral that it currently says [facility] but its needs to be [facility] NPI is [phone]

## 2019-10-27 NOTE — Telephone Encounter (Signed)
Left message stating facility has been changed.

## 2019-10-30 ENCOUNTER — Other Ambulatory Visit: Payer: Self-pay

## 2019-10-30 ENCOUNTER — Encounter (HOSPITAL_COMMUNITY): Payer: Self-pay | Admitting: Radiology

## 2019-10-30 ENCOUNTER — Ambulatory Visit (HOSPITAL_COMMUNITY)
Admission: RE | Admit: 2019-10-30 | Discharge: 2019-10-30 | Disposition: A | Discharge status: Home / Self Care | Payer: PPO | Source: Ambulatory Visit | Attending: Family Medicine | Admitting: Family Medicine

## 2019-10-30 DIAGNOSIS — R29898 Other symptoms and signs involving the musculoskeletal system: Secondary | ICD-10-CM

## 2019-10-30 DIAGNOSIS — M50121 Cervical disc disorder at C4-C5 level with radiculopathy: Secondary | ICD-10-CM | POA: Diagnosis not present

## 2019-10-30 DIAGNOSIS — M542 Cervicalgia: Secondary | ICD-10-CM | POA: Insufficient documentation

## 2019-10-30 DIAGNOSIS — R200 Anesthesia of skin: Secondary | ICD-10-CM | POA: Diagnosis not present

## 2019-10-30 DIAGNOSIS — R202 Paresthesia of skin: Secondary | ICD-10-CM | POA: Diagnosis not present

## 2019-10-30 DIAGNOSIS — R531 Weakness: Secondary | ICD-10-CM | POA: Diagnosis not present

## 2019-10-30 DIAGNOSIS — I6782 Cerebral ischemia: Secondary | ICD-10-CM | POA: Diagnosis not present

## 2019-10-30 DIAGNOSIS — J341 Cyst and mucocele of nose and nasal sinus: Secondary | ICD-10-CM | POA: Diagnosis not present

## 2019-10-30 DIAGNOSIS — M4802 Spinal stenosis, cervical region: Secondary | ICD-10-CM | POA: Diagnosis not present

## 2019-10-31 ENCOUNTER — Other Ambulatory Visit: Payer: Self-pay | Admitting: Family Medicine

## 2019-10-31 DIAGNOSIS — M4802 Spinal stenosis, cervical region: Secondary | ICD-10-CM

## 2019-10-31 DIAGNOSIS — G9389 Other specified disorders of brain: Secondary | ICD-10-CM

## 2019-10-31 NOTE — Progress Notes (Signed)
a 

## 2019-11-01 ENCOUNTER — Telehealth: Payer: Self-pay

## 2019-11-01 NOTE — Telephone Encounter (Signed)
[name] with [facility] left a VM that a referral was uploaded in proficient this morning and she needs a return call at [phone] hit option 0 and have them page her.

## 2019-11-01 NOTE — Telephone Encounter (Signed)
Patient is asking for her MRI results. She said that [facility], she said she was not ready to set that up until she got her results. Please advise

## 2019-11-02 DIAGNOSIS — E1159 Type 2 diabetes mellitus with other circulatory complications: Secondary | ICD-10-CM | POA: Diagnosis not present

## 2019-11-02 DIAGNOSIS — E785 Hyperlipidemia, unspecified: Secondary | ICD-10-CM | POA: Diagnosis not present

## 2019-11-02 DIAGNOSIS — E559 Vitamin D deficiency, unspecified: Secondary | ICD-10-CM | POA: Diagnosis not present

## 2019-11-02 DIAGNOSIS — I10 Essential (primary) hypertension: Secondary | ICD-10-CM | POA: Diagnosis not present

## 2019-11-02 DIAGNOSIS — E1140 Type 2 diabetes mellitus with diabetic neuropathy, unspecified: Secondary | ICD-10-CM | POA: Diagnosis not present

## 2019-11-02 NOTE — Telephone Encounter (Signed)
Patient aware of results and [facility] will call to schedule appt asap

## 2019-11-04 LAB — TEST AUTHORIZATION

## 2019-11-04 LAB — CBC
HCT: 34.9 % — ABNORMAL LOW (ref 35.0–45.0)
Hemoglobin: 11.3 g/dL — ABNORMAL LOW (ref 11.7–15.5)
MCH: 26.4 pg — ABNORMAL LOW (ref 27.0–33.0)
MCHC: 32.4 g/dL (ref 32.0–36.0)
MCV: 81.5 fL (ref 80.0–100.0)
MPV: 10.5 fL (ref 7.5–12.5)
Platelets: 343 10*3/uL (ref 140–400)
RBC: 4.28 10*6/uL (ref 3.80–5.10)
RDW: 15.5 % — ABNORMAL HIGH (ref 11.0–15.0)
WBC: 6.3 10*3/uL (ref 3.8–10.8)

## 2019-11-04 LAB — COMPLETE METABOLIC PANEL WITH GFR
AG Ratio: 1.5 (calc) (ref 1.0–2.5)
ALT: 13 U/L (ref 6–29)
AST: 11 U/L (ref 10–35)
Albumin: 4.3 g/dL (ref 3.6–5.1)
Alkaline phosphatase (APISO): 57 U/L (ref 37–153)
BUN: 11 mg/dL (ref 7–25)
CO2: 27 mmol/L (ref 20–32)
Calcium: 9.8 mg/dL (ref 8.6–10.4)
Chloride: 101 mmol/L (ref 98–110)
Creat: 0.7 mg/dL (ref 0.60–0.93)
GFR, Est African American: 99 mL/min/{1.73_m2} (ref >=60)
GFR, Est Non African American: 85 mL/min/{1.73_m2} (ref >=60)
Globulin: 2.8 g/dL (calc) (ref 1.9–3.7)
Glucose, Bld: 153 mg/dL — ABNORMAL HIGH (ref 65–99)
Potassium: 4.3 mmol/L (ref 3.5–5.3)
Sodium: 140 mmol/L (ref 135–146)
Total Bilirubin: 0.5 mg/dL (ref 0.2–1.2)
Total Protein: 7.1 g/dL (ref 6.1–8.1)

## 2019-11-04 LAB — LIPID PANEL
Cholesterol: 174 mg/dL (ref <200)
HDL: 54 mg/dL (ref >=50)
LDL Cholesterol (Calc): 89 mg/dL (calc)
Non-HDL Cholesterol (Calc): 120 mg/dL (calc) (ref <130)
Total CHOL/HDL Ratio: 3.2 (calc) (ref <5.0)
Triglycerides: 226 mg/dL — ABNORMAL HIGH (ref <150)

## 2019-11-04 LAB — VITAMIN D 25 HYDROXY (VIT D DEFICIENCY, FRACTURES): Vit D, 25-Hydroxy: 28 ng/mL — ABNORMAL LOW (ref 30–100)

## 2019-11-04 LAB — TSH: TSH: 2.58 mIU/L (ref 0.40–4.50)

## 2019-11-04 LAB — VITAMIN B12: Vitamin B-12: 1789 pg/mL — ABNORMAL HIGH (ref 200–1100)

## 2019-11-04 LAB — IRON: Iron: 48 ug/dL (ref 45–160)

## 2019-11-04 LAB — MICROALBUMIN, URINE: Microalb, Ur: 3.3 mg/dL

## 2019-11-08 ENCOUNTER — Ambulatory Visit (INDEPENDENT_AMBULATORY_CARE_PROVIDER_SITE_OTHER): Payer: PPO | Admitting: Family Medicine

## 2019-11-08 VITALS — BP 167/82 | HR 74 | Resp 14 | Ht 69.0 in | Wt 288.0 lb

## 2019-11-08 DIAGNOSIS — Z23 Encounter for immunization: Secondary | ICD-10-CM

## 2019-11-08 DIAGNOSIS — R9402 Abnormal brain scan: Secondary | ICD-10-CM | POA: Diagnosis not present

## 2019-11-08 DIAGNOSIS — E1140 Type 2 diabetes mellitus with diabetic neuropathy, unspecified: Secondary | ICD-10-CM

## 2019-11-08 DIAGNOSIS — R29898 Other symptoms and signs involving the musculoskeletal system: Secondary | ICD-10-CM

## 2019-11-08 DIAGNOSIS — I10 Essential (primary) hypertension: Secondary | ICD-10-CM | POA: Diagnosis not present

## 2019-11-08 DIAGNOSIS — E1159 Type 2 diabetes mellitus with other circulatory complications: Secondary | ICD-10-CM

## 2019-11-08 DIAGNOSIS — R937 Abnormal findings on diagnostic imaging of other parts of musculoskeletal system: Secondary | ICD-10-CM | POA: Diagnosis not present

## 2019-11-08 DIAGNOSIS — E782 Mixed hyperlipidemia: Secondary | ICD-10-CM | POA: Diagnosis not present

## 2019-11-08 LAB — POCT GLYCOSYLATED HEMOGLOBIN (HGB A1C): Hemoglobin A1C: 7.4 % — AB (ref 4.0–5.6)

## 2019-11-08 NOTE — Patient Instructions (Addendum)
Annual physical with MD and re evaluate blood pressure, nov 3 or after, call if you need me before  Blood pressure is high.Increase amlodipine to 10 mg daily, you may take TWO 5 mg tablets together to equal 10 mg , or FOUR 2.5 mg tablets together to equal 10 mg till done, new script is 10 mg ONE daily  fLU VACCINE TODAY and microalb  GOOD BLOOD SUGAR , N O CHANGE IN MEDICATION  You are referred back to neurosurgery as we discussed  It is important that you exercise regularly at least 30 minutes 5 times a week. If you develop chest pain, have severe difficulty breathing, or feel very tired, stop exercising immediately and seek medical attention  Think about what you will eat, plan ahead. Choose " clean, green, fresh or frozen" over canned, processed or packaged foods which are more sugary, salty and fatty. 70 to 75% of food eaten should be vegetables and fruit. Three meals at set times with snacks allowed between meals, but they must be fruit or vegetables. Aim to eat over a 12 hour period , example 7 am to 7 pm, and STOP after  your last meal of the day. Drink water,generally about 64 ounces per day, no other drink is as healthy. Fruit juice is best enjoyed in a healthy way, by EATING the fruit. Thanks for choosing [facility], we consider it a privelige to serve you.

## 2019-11-23 ENCOUNTER — Encounter: Payer: Self-pay | Admitting: Family Medicine

## 2019-11-23 DIAGNOSIS — R9402 Abnormal brain scan: Secondary | ICD-10-CM | POA: Insufficient documentation

## 2019-11-23 NOTE — Assessment & Plan Note (Signed)
Controlled, no change in medication Theresa Adkins is reminded of the importance of commitment to daily physical activity for 30 minutes or more, as able and the need to limit carbohydrate intake to 30 to 60 grams per meal to help with blood sugar control.   The need to take medication as prescribed, test blood sugar as directed, and to call between visits if there is a concern that blood sugar is uncontrolled is also discussed.   Theresa Adkins is reminded of the importance of daily foot exam, annual eye examination, and good blood sugar, blood pressure and cholesterol control.  Diabetic Labs Latest Ref Rng & Units 11/08/2019 11/02/2019 08/02/2019 12/15/2018 08/03/2018  HbA1c 4.0 - 5.6 % 7.4(A) - 7.7(A) 6.9(H) 7.4(H)  Microalbumin mg/dL - 3.3 - - -  Micro/Creat Ratio <30 mcg/mg creat - - - - -  Chol <200 mg/dL - 174 - 166 156  HDL > OR = 50 mg/dL - 54 - 47(L) 43(L)  Calc LDL mg/dL (calc) - 89 - 84 82  Triglycerides <150 mg/dL - 226(H) - 263(H) 222(H)  Creatinine 0.60 - 0.93 mg/dL - 0.70 - 0.69 0.77   BP/Weight 11/08/2019 09/29/2019 09/28/2019 08/25/2019 08/02/2019 12/21/2018 11/20/1115  Systolic BP 356 701 410 301 314 388 875  Diastolic BP 82 85 81 81 74 84 63  Wt. (Lbs) 288 278 279 279 267 263 255  BMI 42.53 41.05 41.2 41.2 39.43 38.84 37.66   Foot/eye exam completion dates Latest Ref Rng & Units 08/16/2019 08/02/2019  Eye Exam No Retinopathy No Retinopathy -  Foot exam Order - - -  Foot Form Completion - - Done

## 2019-11-23 NOTE — Assessment & Plan Note (Signed)
Hyperlipidemia:Low fat diet discussed and encouraged.   Lipid Panel  Lab Results  Component Value Date   CHOL 174 11/02/2019   HDL 54 11/02/2019   LDLCALC 89 11/02/2019   TRIG 226 (H) 11/02/2019   CHOLHDL 3.2 11/02/2019   Elevated TG , needs to lower fat intake

## 2019-11-23 NOTE — Assessment & Plan Note (Signed)
Uncontrolled increase dose of amlodipine with close follow up\ DASH diet and commitment to daily physical activity for a minimum of 30 minutes discussed and encouraged, as a part of hypertension management. The importance of attaining a healthy weight is also discussed.  BP/Weight 11/08/2019 09/29/2019 09/28/2019 08/25/2019 08/02/2019 12/21/2018 84/0/6986  Systolic BP 148 307 354 301 484 039 795  Diastolic BP 82 85 81 81 74 84 63  Wt. (Lbs) 288 278 279 279 267 263 255  BMI 42.53 41.05 41.2 41.2 39.43 38.84 37.66

## 2019-11-23 NOTE — Assessment & Plan Note (Signed)
Extra axial 2 cm mass posterior right , likely meningioma, refer Neurosurgery

## 2019-11-23 NOTE — Progress Notes (Signed)
Theresa Adkins     MRN: [id number]      DOB: 1945-11-22   HPI Theresa Adkins is here for follow up and re-evaluation of chronic medical conditions, medication management and review of any available recent lab and radiology data.  Preventive health is updated, specifically  Cancer screening and Immunization.   Elected not to keep appointment with Neurosurgery until discussing further with me, still has RUE weakness and numbness and brain scan is abnormal, states she will need to be referred again so I will follow up. The PT denies any adverse reactions to current medications since the last visit.  There are no new concerns.  There are no specific complaints   ROS Denies recent fever or chills. Denies sinus pressure, nasal congestion, ear pain or sore throat. Denies chest congestion, productive cough or wheezing. Denies chest pains, palpitations and leg swelling Denies abdominal pain, nausea, vomiting,diarrhea or constipation.   Denies dysuria, frequency, hesitancy or incontinence. .  Denies depression, anxiety or insomnia. Denies skin break down or rash.   PE  BP (!) 167/82   Pulse 74   Resp 14   Ht 5\' 9"  (1.753 m)   Wt 288 lb (130.6 kg)   SpO2 93%   BMI 42.53 kg/m   Patient alert and oriented and in no cardiopulmonary distress.  HEENT: No facial asymmetry, EOMI,     Neck decreased rOM .  Chest: Clear to auscultation bilaterally.  CVS: S1, S2 no murmurs, no S3.Regular rate.  ABD: Soft non tender.   Ext: No edema  WC:[id number]  ROM spine, shoulders, hips and knees.  Skin: Intact, no ulcerations or rash noted.  Psych: Good eye contact, normal affect. Memory intact not anxious or depressed appearing.  CNS: CN 2-12 intact, power,  Decreased sensation and power  in RUE , otherwise normal   Assessment & Plan  Hypertension associated with diabetes ([place]) Uncontrolled increase dose of amlodipine with close follow up\ DASH diet and commitment to daily physical activity  for a minimum of 30 minutes discussed and encouraged, as a part of hypertension management. The importance of attaining a healthy weight is also discussed.  BP/Weight 11/08/2019 09/29/2019 09/28/2019 08/25/2019 08/02/2019 12/21/2018 76/02/6071  Systolic BP 710 626 948 546 270 350 093  Diastolic BP 82 85 81 81 74 84 63  Wt. (Lbs) 288 278 279 279 267 263 255  BMI 42.53 41.05 41.2 41.2 39.43 38.84 37.66       Controlled type 2 diabetes with neuropathy (HCC) Controlled, no change in medication Theresa Adkins is reminded of the importance of commitment to daily physical activity for 30 minutes or more, as able and the need to limit carbohydrate intake to 30 to 60 grams per meal to help with blood sugar control.   The need to take medication as prescribed, test blood sugar as directed, and to call between visits if there is a concern that blood sugar is uncontrolled is also discussed.   Theresa Adkins is reminded of the importance of daily foot exam, annual eye examination, and good blood sugar, blood pressure and cholesterol control.  Diabetic Labs Latest Ref Rng & Units 11/08/2019 11/02/2019 08/02/2019 12/15/2018 08/03/2018  HbA1c 4.0 - 5.6 % 7.4(A) - 7.7(A) 6.9(H) 7.4(H)  Microalbumin mg/dL - 3.3 - - -  Micro/Creat Ratio <30 mcg/mg creat - - - - -  Chol <200 mg/dL - 174 - 166 156  HDL > OR = 50 mg/dL - 54 - 47(L) 43(L)  Calc LDL mg/dL (  calc) - 89 - 84 82  Triglycerides <150 mg/dL - 226(H) - 263(H) 222(H)  Creatinine 0.60 - 0.93 mg/dL - 0.70 - 0.69 0.77   BP/Weight 11/08/2019 09/29/2019 09/28/2019 08/25/2019 08/02/2019 12/21/2018 41/04/2438  Systolic BP 102 725 366 440 347 425 956  Diastolic BP 82 85 81 81 74 84 63  Wt. (Lbs) 288 278 279 279 267 263 255  BMI 42.53 41.05 41.2 41.2 39.43 38.84 37.66   Foot/eye exam completion dates Latest Ref Rng & Units 08/16/2019 08/02/2019  Eye Exam No Retinopathy No Retinopathy -  Foot exam Order - - -  Foot Form Completion - - Done        Right arm weakness Persistent  with abnormal scan of C spine , refer back to neurosurgery  Morbid obesity ([place])  Patient re-educated about  the importance of commitment to a  minimum of 150 minutes of exercise per week as able.  The importance of healthy food choices with portion control discussed, as well as eating regularly and within a 12 hour window most days. The need to choose "clean , green" food 50 to 75% of the time is discussed, as well as to make water the primary drink and set a goal of 64 ounces water daily.    Weight /BMI 11/08/2019 09/29/2019 09/28/2019  WEIGHT 288 lb 278 lb 279 lb  HEIGHT 5\' 9"  5\' 9"  5\' 9"   BMI 42.53 kg/m2 41.05 kg/m2 41.2 kg/m2      Hyperlipidemia Hyperlipidemia:Low fat diet discussed and encouraged.   Lipid Panel  Lab Results  Component Value Date   CHOL 174 11/02/2019   HDL 54 11/02/2019   LDLCALC 89 11/02/2019   TRIG 226 (H) 11/02/2019   CHOLHDL 3.2 11/02/2019   Elevated TG , needs to lower fat intake   Abnormal brain scan Extra axial 2 cm mass posterior right , likely meningioma, refer Neurosurgery

## 2019-11-23 NOTE — Assessment & Plan Note (Signed)
Persistent with abnormal scan of C spine , refer back to neurosurgery

## 2019-11-23 NOTE — Assessment & Plan Note (Signed)
  Patient re-educated about  the importance of commitment to a  minimum of 150 minutes of exercise per week as able.  The importance of healthy food choices with portion control discussed, as well as eating regularly and within a 12 hour window most days. The need to choose "clean , green" food 50 to 75% of the time is discussed, as well as to make water the primary drink and set a goal of 64 ounces water daily.    Weight /BMI 11/08/2019 09/29/2019 09/28/2019  WEIGHT 288 lb 278 lb 279 lb  HEIGHT 5\' 9"  5\' 9"  5\' 9"   BMI 42.53 kg/m2 41.05 kg/m2 41.2 kg/m2

## 2019-12-13 ENCOUNTER — Other Ambulatory Visit: Payer: Self-pay

## 2019-12-13 MED ORDER — AMLODIPINE BESYLATE 10 MG PO TABS: 10.0000 mg | ORAL_TABLET | Freq: Every day | ORAL | 1 refills | Status: DC

## 2020-01-03 ENCOUNTER — Other Ambulatory Visit: Payer: Self-pay | Admitting: Family Medicine

## 2020-01-20 ENCOUNTER — Encounter: Payer: Self-pay | Admitting: Family Medicine

## 2020-01-20 ENCOUNTER — Other Ambulatory Visit: Payer: Self-pay

## 2020-01-20 ENCOUNTER — Ambulatory Visit (INDEPENDENT_AMBULATORY_CARE_PROVIDER_SITE_OTHER): Payer: PPO | Admitting: Family Medicine

## 2020-01-20 VITALS — BP 138/80 | HR 80 | Resp 16 | Ht 69.0 in | Wt 284.0 lb

## 2020-01-20 DIAGNOSIS — M4712 Other spondylosis with myelopathy, cervical region: Secondary | ICD-10-CM | POA: Diagnosis not present

## 2020-01-20 DIAGNOSIS — Z0000 Encounter for general adult medical examination without abnormal findings: Secondary | ICD-10-CM | POA: Insufficient documentation

## 2020-01-20 DIAGNOSIS — M4722 Other spondylosis with radiculopathy, cervical region: Secondary | ICD-10-CM | POA: Diagnosis not present

## 2020-01-20 DIAGNOSIS — R9402 Abnormal brain scan: Secondary | ICD-10-CM

## 2020-01-20 DIAGNOSIS — E1140 Type 2 diabetes mellitus with diabetic neuropathy, unspecified: Secondary | ICD-10-CM

## 2020-01-20 DIAGNOSIS — M542 Cervicalgia: Secondary | ICD-10-CM

## 2020-01-20 DIAGNOSIS — E782 Mixed hyperlipidemia: Secondary | ICD-10-CM

## 2020-01-20 DIAGNOSIS — E1159 Type 2 diabetes mellitus with other circulatory complications: Secondary | ICD-10-CM

## 2020-01-20 DIAGNOSIS — I152 Hypertension secondary to endocrine disorders: Secondary | ICD-10-CM

## 2020-01-20 MED ORDER — UNABLE TO FIND
0 refills | Status: DC
Start: 2020-01-20 — End: 2022-12-17

## 2020-01-20 NOTE — Assessment & Plan Note (Signed)
  Patient re-educated about  the importance of commitment to a  minimum of 150 minutes of exercise per week as able.  The importance of healthy food choices with portion control discussed, as well as eating regularly and within a 12 hour window most days. The need to choose "clean , green" food 50 to 75% of the time is discussed, as well as to make water the primary drink and set a goal of 64 ounces water daily.    Weight /BMI 01/20/2020 11/08/2019 09/29/2019  WEIGHT 284 lb 288 lb 278 lb  HEIGHT 5\' 9"  5\' 9"  5\' 9"   BMI 41.94 kg/m2 42.53 kg/m2 41.05 kg/m2

## 2020-01-20 NOTE — Progress Notes (Signed)
Theresa Adkins     MRN: [id number]      DOB: November 24, 1945  HPI: Patient is in for annual physical exam. . Immunization is reviewed , and  updated if needed.   PE: BP 138/80   Pulse 80   Resp 16   Ht 5\' 9"  (1.753 m)   Wt 284 lb (128.8 kg)   SpO2 98%   BMI 41.94 kg/m   Pleasant  female, alert and oriented x 3, in no cardio-pulmonary distress. Afebrile. HEENT No facial trauma or asymetry. Sinuses non tender.  Extra occullar muscles intact.. External ears normal, . Neck: supple, no adenopathy,JVD or thyromegaly.No bruits.  Chest: Clear to ascultation bilaterally.No crackles or wheezes. Non tender to palpation  Breast: Normal mammogram in  06/2019, no exam in office  Cardiovascular system; Heart sounds normal,  S1 and  S2 ,no S3.  No murmur, or thrill. Apical beat not displaced Peripheral pulses normal.  Abdomen: Soft, non tender, no organomegaly or masses. No bruits. Bowel sounds normal. No guarding, tenderness or rebound.    Musculoskeletal exam: Decreased  ROM of spine, hips , shoulders and knees.  deformity ,swelling and crepitus noted. No muscle wasting or atrophy.   Neurologic: Cranial nerves 2 to 12 intact. Power, tone ,sensation and reflexes normal throughout.  disturbance in gait. No tremor.  Skin: Intact, no ulceration, erythema , scaling or rash noted. Pigmentation normal throughout  Psych; Normal mood and affect. Judgement and concentration normal   A/P  Controlled type 2 diabetes with neuropathy (HCC) Controlled, no change in medication Theresa Adkins is reminded of the importance of commitment to daily physical activity for 30 minutes or more, as able and the need to limit carbohydrate intake to 30 to 60 grams per meal to help with blood sugar control.   The need to take medication as prescribed, test blood sugar as directed, and to call between visits if there is a concern that blood sugar is uncontrolled is also discussed.   Theresa Adkins is  reminded of the importance of daily foot exam, annual eye examination, and good blood sugar, blood pressure and cholesterol control.  Diabetic Labs Latest Ref Rng & Units 11/08/2019 11/02/2019 08/02/2019 12/15/2018 08/03/2018  HbA1c 4.0 - 5.6 % 7.4(A) - 7.7(A) 6.9(H) 7.4(H)  Microalbumin mg/dL - 3.3 - - -  Micro/Creat Ratio <30 mcg/mg creat - - - - -  Chol <200 mg/dL - 174 - 166 156  HDL > OR = 50 mg/dL - 54 - 47(L) 43(L)  Calc LDL mg/dL (calc) - 89 - 84 82  Triglycerides <150 mg/dL - 226(H) - 263(H) 222(H)  Creatinine 0.60 - 0.93 mg/dL - 0.70 - 0.69 0.77   BP/Weight 01/20/2020 11/08/2019 09/29/2019 09/28/2019 08/25/2019 08/02/2019 81/02/5724  Systolic BP 203 559 741 638 453 646 803  Diastolic BP 80 82 85 81 81 74 84  Wt. (Lbs) 284 288 278 279 279 267 263  BMI 41.94 42.53 41.05 41.2 41.2 39.43 38.84   Foot/eye exam completion dates Latest Ref Rng & Units 01/20/2020 08/16/2019  Eye Exam No Retinopathy - No Retinopathy  Foot exam Order - - -  Foot Form Completion - Done -        Encounter for annual physical exam Annual exam as documented. Counseling done  re healthy lifestyle involving commitment to 150 minutes exercise per week, heart healthy diet, and attaining healthy weight.The importance of adequate sleep also discussed. Regular seat belt use and home safety, is also discussed. Changes in  health habits are decided on by the patient with goals and time frames  set for achieving them. Immunization and cancer screening needs are specifically addressed at this visit.   Abnormal brain scan Needs to f/u with neurosurgery  Morbid obesity [facility])  Patient re-educated about  the importance of commitment to a  minimum of 150 minutes of exercise per week as able.  The importance of healthy food choices with portion control discussed, as well as eating regularly and within a 12 hour window most days. The need to choose "clean , green" food 50 to 75% of the time is discussed, as well as to make  water the primary drink and set a goal of 64 ounces water daily.    Weight /BMI 01/20/2020 11/08/2019 09/29/2019  WEIGHT 284 lb 288 lb 278 lb  HEIGHT 5\' 9"  5\' 9"  5\' 9"   BMI 41.94 kg/m2 42.53 kg/m2 41.05 kg/m2      Neck pain Ongoing with RUE weakness and tingling reported, needs to f/u with Neurosurgery

## 2020-01-20 NOTE — Patient Instructions (Signed)
Follow-up in office with MD end April call  if you need me sooner sooner.  Please get fasting lipids CMP and EGFR and hemoglobin A1c 2nd week in  January.  You are referred again to neurosurgery to the have the neck pain and headache with abnormal imaging studies reviewed.  Please it is important that you keep this appointment.  Your foot exam does qualify you for diabetic shoes please follow through with this.  It is important that you exercise regularly at least 30 minutes 5 times a week. If you develop chest pain, have severe difficulty breathing, or feel very tired, stop exercising immediately and seek medical attention  Think about what you will eat, plan ahead. Choose " clean, green, fresh or frozen" over canned, processed or packaged foods which are more sugary, salty and fatty. 70 to 75% of food eaten should be vegetables and fruit. Three meals at set times with snacks allowed between meals, but they must be fruit or vegetables. Aim to eat over a 12 hour period , example 7 am to 7 pm, and STOP after  your last meal of the day. Drink water,generally about 64 ounces per day, no other drink is as healthy. Fruit juice is best enjoyed in a healthy way, by EATING the fruit. Thanks for choosing [facility], we consider it a privelige to serve you.

## 2020-01-20 NOTE — Assessment & Plan Note (Signed)
Ongoing with RUE weakness and tingling reported, needs to f/u with Neurosurgery

## 2020-01-20 NOTE — Assessment & Plan Note (Signed)

## 2020-01-20 NOTE — Assessment & Plan Note (Signed)
Needs to f/u with neurosurgery

## 2020-01-20 NOTE — Assessment & Plan Note (Signed)
Controlled, no change in medication Theresa Adkins is reminded of the importance of commitment to daily physical activity for 30 minutes or more, as able and the need to limit carbohydrate intake to 30 to 60 grams per meal to help with blood sugar control.   The need to take medication as prescribed, test blood sugar as directed, and to call between visits if there is a concern that blood sugar is uncontrolled is also discussed.   Theresa Adkins is reminded of the importance of daily foot exam, annual eye examination, and good blood sugar, blood pressure and cholesterol control.  Diabetic Labs Latest Ref Rng & Units 11/08/2019 11/02/2019 08/02/2019 12/15/2018 08/03/2018  HbA1c 4.0 - 5.6 % 7.4(A) - 7.7(A) 6.9(H) 7.4(H)  Microalbumin mg/dL - 3.3 - - -  Micro/Creat Ratio <30 mcg/mg creat - - - - -  Chol <200 mg/dL - 174 - 166 156  HDL > OR = 50 mg/dL - 54 - 47(L) 43(L)  Calc LDL mg/dL (calc) - 89 - 84 82  Triglycerides <150 mg/dL - 226(H) - 263(H) 222(H)  Creatinine 0.60 - 0.93 mg/dL - 0.70 - 0.69 0.77   BP/Weight 01/20/2020 11/08/2019 09/29/2019 09/28/2019 08/25/2019 08/02/2019 74/09/2705  Systolic BP 867 544 920 100 712 197 588  Diastolic BP 80 82 85 81 81 74 84  Wt. (Lbs) 284 288 278 279 279 267 263  BMI 41.94 42.53 41.05 41.2 41.2 39.43 38.84   Foot/eye exam completion dates Latest Ref Rng & Units 01/20/2020 08/16/2019  Eye Exam No Retinopathy - No Retinopathy  Foot exam Order - - -  Foot Form Completion - Done -

## 2020-03-02 DIAGNOSIS — E782 Mixed hyperlipidemia: Secondary | ICD-10-CM | POA: Diagnosis not present

## 2020-03-02 DIAGNOSIS — I152 Hypertension secondary to endocrine disorders: Secondary | ICD-10-CM | POA: Diagnosis not present

## 2020-03-02 DIAGNOSIS — E1140 Type 2 diabetes mellitus with diabetic neuropathy, unspecified: Secondary | ICD-10-CM | POA: Diagnosis not present

## 2020-03-02 DIAGNOSIS — E1159 Type 2 diabetes mellitus with other circulatory complications: Secondary | ICD-10-CM | POA: Diagnosis not present

## 2020-03-03 LAB — CMP14+EGFR
ALT: 14 IU/L (ref 0–32)
AST: 19 IU/L (ref 0–40)
Albumin/Globulin Ratio: 1.5 (ref 1.2–2.2)
Albumin: 4.5 g/dL (ref 3.7–4.7)
Alkaline Phosphatase: 69 IU/L (ref 44–121)
BUN/Creatinine Ratio: 12 (ref 12–28)
BUN: 10 mg/dL (ref 8–27)
Bilirubin Total: 0.5 mg/dL (ref 0.0–1.2)
CO2: 21 mmol/L (ref 20–29)
Calcium: 9.6 mg/dL (ref 8.7–10.3)
Chloride: 99 mmol/L (ref 96–106)
Creatinine, Ser: 0.83 mg/dL (ref 0.57–1.00)
GFR calc Af Amer: 80 mL/min/{1.73_m2} (ref >59)
GFR calc non Af Amer: 70 mL/min/{1.73_m2} (ref >59)
Globulin, Total: 3 g/dL (ref 1.5–4.5)
Glucose: 179 mg/dL — ABNORMAL HIGH (ref 65–99)
Potassium: 4.4 mmol/L (ref 3.5–5.2)
Sodium: 138 mmol/L (ref 134–144)
Total Protein: 7.5 g/dL (ref 6.0–8.5)

## 2020-03-03 LAB — LIPID PANEL
Chol/HDL Ratio: 3.7 ratio (ref 0.0–4.4)
Cholesterol, Total: 171 mg/dL (ref 100–199)
HDL: 46 mg/dL (ref >39)
LDL Chol Calc (NIH): 90 mg/dL (ref 0–99)
Triglycerides: 207 mg/dL — ABNORMAL HIGH (ref 0–149)
VLDL Cholesterol Cal: 35 mg/dL (ref 5–40)

## 2020-03-03 LAB — HEMOGLOBIN A1C
Est. average glucose Bld gHb Est-mCnc: 183 mg/dL
Hgb A1c MFr Bld: 8 % — ABNORMAL HIGH (ref 4.8–5.6)

## 2020-03-06 ENCOUNTER — Other Ambulatory Visit: Payer: Self-pay

## 2020-03-06 ENCOUNTER — Telehealth (INDEPENDENT_AMBULATORY_CARE_PROVIDER_SITE_OTHER): Payer: PPO | Admitting: Family Medicine

## 2020-03-06 ENCOUNTER — Encounter: Payer: Self-pay | Admitting: Family Medicine

## 2020-03-06 VITALS — Ht 69.0 in | Wt 278.0 lb

## 2020-03-06 DIAGNOSIS — E782 Mixed hyperlipidemia: Secondary | ICD-10-CM

## 2020-03-06 DIAGNOSIS — E1140 Type 2 diabetes mellitus with diabetic neuropathy, unspecified: Secondary | ICD-10-CM | POA: Diagnosis not present

## 2020-03-06 NOTE — Progress Notes (Signed)
Virtual Visit via Telephone Note  I connected with Theresa Adkins on 03/06/20 at 10:00 AM EST by telephone and verified that I am speaking with the correct person using two identifiers.  Location: Patient: home Provider: work   I discussed the limitations, risks, security and privacy concerns of performing an evaluation and management service by telephone and the availability of in person appointments. I also discussed with the patient that there may be a patient responsible charge related to this service. The patient expressed understanding and agreed to proceed.   History of Present Illness: Visit for review of recent labs , as blood sugar uncontrolled Denies polyuria, polydipsia, blurred vision , or hypoglycemic episodes. FBD around 150's No regular exercise , c/o back pain   Observations/Objective: Ht 5\' 9"  (1.753 m)   Wt 278 lb (126.1 kg)   BMI 41.05 kg/m   Good communication with no confusion and intact memory. Alert and oriented x 3 No signs of respiratory distress during speech     Assessment and Plan: Controlled type 2 diabetes with neuropathy (HCC) Deteriorate, recommended increase metformin to twice daily, pt opting to stay on the same dose and review in 3 months, also refuses diabetic ed Theresa Adkins is reminded of the importance of commitment to daily physical activity for 30 minutes or more, as able and the need to limit carbohydrate intake to 30 to 60 grams per meal to help with blood sugar control.   The need to take medication as prescribed, test blood sugar as directed, and to call between visits if there is a concern that blood sugar is uncontrolled is also discussed.   Theresa Adkins is reminded of the importance of daily foot exam, annual eye examination, and good blood sugar, blood pressure and cholesterol control.  Diabetic Labs Latest Ref Rng & Units 03/02/2020 11/08/2019 11/02/2019 08/02/2019 12/15/2018  HbA1c 4.8 - 5.6 % 8.0(H) 7.4(A) - 7.7(A) 6.9(H)  Microalbumin  mg/dL - - 3.3 - -  Micro/Creat Ratio <30 mcg/mg creat - - - - -  Chol 100 - 199 mg/dL 171 - 174 - 166  HDL >39 mg/dL 46 - 54 - 47(L)  Calc LDL 0 - 99 mg/dL 90 - 89 - 84  Triglycerides 0 - 149 mg/dL 207(H) - 226(H) - 263(H)  Creatinine 0.57 - 1.00 mg/dL 0.83 - 0.70 - 0.69   BP/Weight 03/06/2020 01/20/2020 11/08/2019 09/29/2019 09/28/2019 08/25/2019 7/61/6073  Systolic BP - 710 626 948 546 270 350  Diastolic BP - 80 82 85 81 81 74  Wt. (Lbs) 278 284 288 278 279 279 267  BMI 41.05 41.94 42.53 41.05 41.2 41.2 39.43   Foot/eye exam completion dates Latest Ref Rng & Units 01/20/2020 08/16/2019  Eye Exam No Retinopathy - No Retinopathy  Foot exam Order - - -  Foot Form Completion - Done -        Hyperlipidemia TG above goal, choleserol at goal Hyperlipidemia:Low fat diet discussed and encouraged.   Lipid Panel  Lab Results  Component Value Date   CHOL 171 03/02/2020   HDL 46 03/02/2020   LDLCALC 90 03/02/2020   TRIG 207 (H) 03/02/2020   CHOLHDL 3.7 03/02/2020       Morbid obesity ([place])  Patient re-educated about  the importance of commitment to a  minimum of 150 minutes of exercise per week as able.  The importance of healthy food choices with portion control discussed, as well as eating regularly and within a 12 hour window most days. The  need to choose "clean , green" food 50 to 75% of the time is discussed, as well as to make water the primary drink and set a goal of 64 ounces water daily.    Weight /BMI 03/06/2020 01/20/2020 11/08/2019  WEIGHT 278 lb 284 lb 288 lb  HEIGHT 5\' 9"  5\' 9"  5\' 9"   BMI 41.05 kg/m2 41.94 kg/m2 42.53 kg/m2        Follow Up Instructions:    I discussed the assessment and treatment plan with the patient. The patient was provided an opportunity to ask questions and all were answered. The patient agreed with the plan and demonstrated an understanding of the instructions.   The patient was advised to call back or seek an in-person evaluation if the  symptoms worsen or if the condition fails to improve as anticipated.  I provided 15  minutes of non-face-to-face time during this encounter.   [name], MD

## 2020-03-06 NOTE — Assessment & Plan Note (Signed)
Deteriorate, recommended increase metformin to twice daily, pt opting to stay on the same dose and review in 3 months, also refuses diabetic ed Theresa Adkins is reminded of the importance of commitment to daily physical activity for 30 minutes or more, as able and the need to limit carbohydrate intake to 30 to 60 grams per meal to help with blood sugar control.   The need to take medication as prescribed, test blood sugar as directed, and to call between visits if there is a concern that blood sugar is uncontrolled is also discussed.   Theresa Adkins is reminded of the importance of daily foot exam, annual eye examination, and good blood sugar, blood pressure and cholesterol control.  Diabetic Labs Latest Ref Rng & Units 03/02/2020 11/08/2019 11/02/2019 08/02/2019 12/15/2018  HbA1c 4.8 - 5.6 % 8.0(H) 7.4(A) - 7.7(A) 6.9(H)  Microalbumin mg/dL - - 3.3 - -  Micro/Creat Ratio <30 mcg/mg creat - - - - -  Chol 100 - 199 mg/dL 171 - 174 - 166  HDL >39 mg/dL 46 - 54 - 47(L)  Calc LDL 0 - 99 mg/dL 90 - 89 - 84  Triglycerides 0 - 149 mg/dL 207(H) - 226(H) - 263(H)  Creatinine 0.57 - 1.00 mg/dL 0.83 - 0.70 - 0.69   BP/Weight 03/06/2020 01/20/2020 11/08/2019 09/29/2019 09/28/2019 08/25/2019 5/88/5027  Systolic BP - 741 287 867 672 094 709  Diastolic BP - 80 82 85 81 81 74  Wt. (Lbs) 278 284 288 278 279 279 267  BMI 41.05 41.94 42.53 41.05 41.2 41.2 39.43   Foot/eye exam completion dates Latest Ref Rng & Units 01/20/2020 08/16/2019  Eye Exam No Retinopathy - No Retinopathy  Foot exam Order - - -  Foot Form Completion - Done -

## 2020-03-06 NOTE — Addendum Note (Signed)
Addended by: Eual Fines on: 03/06/2020 11:34 AM   Modules accepted: Orders

## 2020-03-06 NOTE — Assessment & Plan Note (Signed)
TG above goal, choleserol at goal Hyperlipidemia:Low fat diet discussed and encouraged.   Lipid Panel  Lab Results  Component Value Date   CHOL 171 03/02/2020   HDL 46 03/02/2020   LDLCALC 90 03/02/2020   TRIG 207 (H) 03/02/2020   CHOLHDL 3.7 03/02/2020

## 2020-03-06 NOTE — Assessment & Plan Note (Signed)
  Patient re-educated about  the importance of commitment to a  minimum of 150 minutes of exercise per week as able.  The importance of healthy food choices with portion control discussed, as well as eating regularly and within a 12 hour window most days. The need to choose "clean , green" food 50 to 75% of the time is discussed, as well as to make water the primary drink and set a goal of 64 ounces water daily.    Weight /BMI 03/06/2020 01/20/2020 11/08/2019  WEIGHT 278 lb 284 lb 288 lb  HEIGHT 5\' 9"  5\' 9"  5\' 9"   BMI 41.05 kg/m2 41.94 kg/m2 42.53 kg/m2

## 2020-03-06 NOTE — Patient Instructions (Addendum)
Please keep April appointment as before, call if you need me sooner  I do recommend increased dose of metformin to help to improve your blood sugar a well as diabetic re education re carbohydrate counting/ management. Please get in touch if you decide you want to do either or both of these  Very important you check and record blood sugar every day Goal for fasting blood sugar ranges from 80 to 130 and 2 hours after any meal or at bedtime should be between 130 to 170.  Your triglycerides are still too high, though better. Need to reduce cheese, nuts , butter, oils  It is important that you exercise regularly at least 30 minutes 5 times a week. If you develop chest pain, have severe difficulty breathing, or feel very tired, stop exercising immediately and seek medical attention   Please get fasting lipid, cmp and EGFr and HBA1C April 13, or after January labs are being mailed to you   Thanks for choosing [facility], we consider it a privelige to serve you.    Diabetes Mellitus and Nutrition, Adult When you have diabetes, or diabetes mellitus, it is very important to have healthy eating habits because your blood sugar (glucose) levels are greatly affected by what you eat and drink. Eating healthy foods in the right amounts, at about the same times every day, can help you:  Control your blood glucose.  Lower your risk of heart disease.  Improve your blood pressure.  Reach or maintain a healthy weight. What can affect my meal plan? Every person with diabetes is different, and each person has different needs for a meal plan. Your health care provider may recommend that you work with a dietitian to make a meal plan that is best for you. Your meal plan may vary depending on factors such as:  The calories you need.  The medicines you take.  Your weight.  Your blood glucose, blood pressure, and cholesterol levels.  Your activity level.  Other health conditions you have,  such as heart or kidney disease. How do carbohydrates affect me? Carbohydrates, also called carbs, affect your blood glucose level more than any other type of food. Eating carbs naturally raises the amount of glucose in your blood. Carb counting is a method for keeping track of how many carbs you eat. Counting carbs is important to keep your blood glucose at a healthy level, especially if you use insulin or take certain oral diabetes medicines. It is important to know how many carbs you can safely have in each meal. This is different for every person. Your dietitian can help you calculate how many carbs you should have at each meal and for each snack. How does alcohol affect me? Alcohol can cause a sudden decrease in blood glucose (hypoglycemia), especially if you use insulin or take certain oral diabetes medicines. Hypoglycemia can be a life-threatening condition. Symptoms of hypoglycemia, such as sleepiness, dizziness, and confusion, are similar to symptoms of having too much alcohol.  Do not drink alcohol if: ? Your health care provider tells you not to drink. ? You are pregnant, may be pregnant, or are planning to become pregnant.  If you drink alcohol: ? Do not drink on an empty stomach. ? Limit how much you use to:  0-1 drink a day for women.  0-2 drinks a day for men. ? Be aware of how much alcohol is in your drink. In the [place] one drink equals one 12 oz bottle of beer (355  mL), one 5 oz glass of wine (148 mL), or one 1 oz glass of hard liquor (44 mL). ? Keep yourself hydrated with water, diet soda, or unsweetened iced tea.  Keep in mind that regular soda, juice, and other mixers may contain a lot of sugar and must be counted as carbs. What are tips for following this plan? Reading food labels  Start by checking the serving size on the "Nutrition Facts" label of packaged foods and drinks. The amount of calories, carbs, fats, and other nutrients listed on the label is based on one  serving of the item. Many items contain more than one serving per package.  Check the total grams (g) of carbs in one serving. You can calculate the number of servings of carbs in one serving by dividing the total carbs by 15. For example, if a food has 30 g of total carbs per serving, it would be equal to 2 servings of carbs.  Check the number of grams (g) of saturated fats and trans fats in one serving. Choose foods that have a low amount or none of these fats.  Check the number of milligrams (mg) of salt (sodium) in one serving. Most people should limit total sodium intake to less than 2,300 mg per day.  Always check the nutrition information of foods labeled as "low-fat" or "nonfat." These foods may be higher in added sugar or refined carbs and should be avoided.  Talk to your dietitian to identify your daily goals for nutrients listed on the label. Shopping  Avoid buying canned, pre-made, or processed foods. These foods tend to be high in fat, sodium, and added sugar.  Shop around the outside edge of the grocery store. This is where you will most often find fresh fruits and vegetables, bulk grains, fresh meats, and fresh dairy. Cooking  Use low-heat cooking methods, such as baking, instead of high-heat cooking methods like deep frying.  Cook using healthy oils, such as olive, canola, or sunflower oil.  Avoid cooking with butter, cream, or high-fat meats. Meal planning  Eat meals and snacks regularly, preferably at the same times every day. Avoid going long periods of time without eating.  Eat foods that are high in fiber, such as fresh fruits, vegetables, beans, and whole grains. Talk with your dietitian about how many servings of carbs you can eat at each meal.  Eat 4-6 oz (112-168 g) of lean protein each day, such as lean meat, chicken, fish, eggs, or tofu. One ounce (oz) of lean protein is equal to: ? 1 oz (28 g) of meat, chicken, or fish. ? 1 egg. ?  cup (62 g) of  tofu.  Eat some foods each day that contain healthy fats, such as avocado, nuts, seeds, and fish.   What foods should I eat? Fruits Berries. Apples. Oranges. Peaches. Apricots. Plums. Grapes. Mango. Papaya. Pomegranate. Kiwi. Cherries. Vegetables Lettuce. Spinach. Leafy greens, including kale, chard, collard greens, and mustard greens. Beets. Cauliflower. Cabbage. Broccoli. Carrots. Green beans. Tomatoes. Peppers. Onions. Cucumbers. Brussels sprouts. Grains Whole grains, such as whole-wheat or whole-grain bread, crackers, tortillas, cereal, and pasta. Unsweetened oatmeal. Quinoa. Brown or wild rice. Meats and other proteins Seafood. Poultry without skin. Lean cuts of poultry and beef. Tofu. Nuts. Seeds. Dairy Low-fat or fat-free dairy products such as milk, yogurt, and cheese. The items listed above may not be a complete list of foods and beverages you can eat. Contact a dietitian for more information. What foods should I avoid? Fruits Fruits  canned with syrup. Vegetables Canned vegetables. Frozen vegetables with butter or cream sauce. Grains Refined white flour and flour products such as bread, pasta, snack foods, and cereals. Avoid all processed foods. Meats and other proteins Fatty cuts of meat. Poultry with skin. Breaded or fried meats. Processed meat. Avoid saturated fats. Dairy Full-fat yogurt, cheese, or milk. Beverages Sweetened drinks, such as soda or iced tea. The items listed above may not be a complete list of foods and beverages you should avoid. Contact a dietitian for more information. Questions to ask a health care provider  Do I need to meet with a diabetes educator?  Do I need to meet with a dietitian?  What number can I call if I have questions?  When are the best times to check my blood glucose? Where to find more information:  American Diabetes Association: diabetes.org  Academy of Nutrition and Dietetics: www.eatright.[employer] of  Diabetes and Digestive and Kidney Diseases: [employer]  Association of Diabetes Care and Education Specialists: www.diabeteseducator.org Summary  It is important to have healthy eating habits because your blood sugar (glucose) levels are greatly affected by what you eat and drink.  A healthy meal plan will help you control your blood glucose and maintain a healthy lifestyle.  Your health care provider may recommend that you work with a dietitian to make a meal plan that is best for you.  Keep in mind that carbohydrates (carbs) and alcohol have immediate effects on your blood glucose levels. It is important to count carbs and to use alcohol carefully. This information is not intended to replace advice given to you by your health care provider. Make sure you discuss any questions you have with your health care provider. Document Revised: 01/12/2019 Document Reviewed: 01/12/2019 Elsevier Patient Education  2021 [employer].

## 2020-03-22 ENCOUNTER — Encounter: Payer: Self-pay | Admitting: Family Medicine

## 2020-03-22 ENCOUNTER — Telehealth (INDEPENDENT_AMBULATORY_CARE_PROVIDER_SITE_OTHER): Payer: PPO | Admitting: Family Medicine

## 2020-03-22 ENCOUNTER — Other Ambulatory Visit: Payer: Self-pay

## 2020-03-22 VITALS — Ht 69.0 in | Wt 275.0 lb

## 2020-03-22 DIAGNOSIS — E1159 Type 2 diabetes mellitus with other circulatory complications: Secondary | ICD-10-CM

## 2020-03-22 DIAGNOSIS — M79672 Pain in left foot: Secondary | ICD-10-CM

## 2020-03-22 DIAGNOSIS — M7989 Other specified soft tissue disorders: Secondary | ICD-10-CM | POA: Diagnosis not present

## 2020-03-22 DIAGNOSIS — I152 Hypertension secondary to endocrine disorders: Secondary | ICD-10-CM

## 2020-03-22 DIAGNOSIS — R2242 Localized swelling, mass and lump, left lower limb: Secondary | ICD-10-CM

## 2020-03-22 MED ORDER — POTASSIUM CHLORIDE CRYS ER 20 MEQ PO TBCR: 20.0000 meq | EXTENDED_RELEASE_TABLET | Freq: Every day | ORAL | 0 refills | Status: DC

## 2020-03-22 MED ORDER — FUROSEMIDE 20 MG PO TABS: 20.0000 mg | ORAL_TABLET | Freq: Every day | ORAL | 0 refills | Status: DC

## 2020-03-22 NOTE — Progress Notes (Signed)
Virtual Visit via Telephone Note  I connected with Theresa Adkins on 03/22/20 at  2:00 PM EST by telephone and verified that I am speaking with the correct person using two identifiers.  Location: Patient: home Provider: office    I discussed the limitations, risks, security and privacy concerns of performing an evaluation and management service by telephone and the availability of in person appointments. I also discussed with the patient that there may be a patient responsible charge related to this service. The patient expressed understanding and agreed to proceed.   History of Present Illness:  4 day h/o left great toe pain and swelling , no trauma C/o leg and foot swelling bilaterally , denies PND, orthopnea, palpitations or chest pain Observations/Objective: Ht 5\' 9"  (1.753 m)   Wt 275 lb (124.7 kg)   BMI 40.61 kg/m  Good communication with no confusion and intact memory. Alert and oriented x 3 No signs of respiratory distress during speech    Assessment and Plan:  Hypertension associated with diabetes ([place]) DASH diet and commitment to daily physical activity for a minimum of 30 minutes discussed and encouraged, as a part of hypertension management. The importance of attaining a healthy weight is also discussed.  BP/Weight 03/22/2020 03/06/2020 01/20/2020 11/08/2019 09/29/2019 9/79/8921 02/27/4172  Systolic BP - - 081 448 185 631 497  Diastolic BP - - 80 82 85 81 81  Wt. (Lbs) 275 278 284 288 278 279 279  BMI 40.61 41.05 41.94 42.53 41.05 41.2 41.2       Morbid obesity (HCC)  Patient re-educated about  the importance of commitment to a  minimum of 150 minutes of exercise per week as able.  The importance of healthy food choices with portion control discussed, as well as eating regularly and within a 12 hour window most days. The need to choose "clean , green" food 50 to 75% of the time is discussed, as well as to make water the primary drink and set a goal of 64 ounces  water daily.    Weight /BMI 03/22/2020 03/06/2020 01/20/2020  WEIGHT 275 lb 278 lb 284 lb  HEIGHT 5\' 9"  5\' 9"  5\' 9"   BMI 40.61 kg/m2 41.05 kg/m2 41.94 kg/m2      Leg swelling bilateral leg swelling , needs to reduce salt intake , elevate legs and intermittent use of lasix and potassium  Localized swelling of toe of left foot Check uric acid level, if elevated, treat as gout   Follow Up Instructions:    I discussed the assessment and treatment plan with the patient. The patient was provided an opportunity to ask questions and all were answered. The patient agreed with the plan and demonstrated an understanding of the instructions.   The patient was advised to call back or seek an in-person evaluation if the symptoms worsen or if the condition fails to improve as anticipated.  I provided 12 minutes of non-face-to-face time during this encounter.   [name], MD

## 2020-03-22 NOTE — Patient Instructions (Signed)
F/U in office with Dr  Patel in 7 to 12 days, re evaluate leg swelling, and blood pressure, call if you need me sooner  Telephone f/u with Dr  in 4 weeks  New for leg swelling is furosemide  One daily and potassium one daily  Please keep legs elevated and reduce salt and processed foods  Please get uric acid level, chem 7and EGFR this week  Thanks for choosing [facility], we consider it a privelige to serve you.   Think about what you will eat, plan ahead. Choose " clean, green, fresh or frozen" over canned, processed or packaged foods which are more sugary, salty and fatty. 70 to 75% of food eaten should be vegetables and fruit. Three meals at set times with snacks allowed between meals, but they must be fruit or vegetables. Aim to eat over a 12 hour period , example 7 am to 7 pm, and STOP after  your last meal of the day. Drink water,generally about 64 ounces per day, no other drink is as healthy. Fruit juice is best enjoyed in a healthy way, by EATING the fruit.  It is important that you exercise regularly at least 30 minutes 5 times a week. If you develop chest pain, have severe difficulty breathing, or feel very tired, stop exercising immediately and seek medical attention     

## 2020-03-23 DIAGNOSIS — E1159 Type 2 diabetes mellitus with other circulatory complications: Secondary | ICD-10-CM | POA: Diagnosis not present

## 2020-03-23 DIAGNOSIS — M79672 Pain in left foot: Secondary | ICD-10-CM | POA: Diagnosis not present

## 2020-03-23 DIAGNOSIS — I152 Hypertension secondary to endocrine disorders: Secondary | ICD-10-CM | POA: Diagnosis not present

## 2020-03-23 DIAGNOSIS — R2242 Localized swelling, mass and lump, left lower limb: Secondary | ICD-10-CM | POA: Diagnosis not present

## 2020-03-24 ENCOUNTER — Other Ambulatory Visit: Payer: Self-pay | Admitting: Family Medicine

## 2020-03-24 LAB — BASIC METABOLIC PANEL
BUN/Creatinine Ratio: 11 — ABNORMAL LOW (ref 12–28)
BUN: 10 mg/dL (ref 8–27)
CO2: 23 mmol/L (ref 20–29)
Calcium: 9.4 mg/dL (ref 8.7–10.3)
Chloride: 101 mmol/L (ref 96–106)
Creatinine, Ser: 0.91 mg/dL (ref 0.57–1.00)
GFR calc Af Amer: 72 mL/min/{1.73_m2} (ref >59)
GFR calc non Af Amer: 62 mL/min/{1.73_m2} (ref >59)
Glucose: 161 mg/dL — ABNORMAL HIGH (ref 65–99)
Potassium: 4.2 mmol/L (ref 3.5–5.2)
Sodium: 142 mmol/L (ref 134–144)

## 2020-03-24 LAB — URIC ACID: Uric Acid: 8.4 mg/dL — ABNORMAL HIGH (ref 3.1–7.9)

## 2020-03-24 MED ORDER — ALLOPURINOL 300 MG PO TABS: 300.0000 mg | ORAL_TABLET | Freq: Every day | ORAL | 6 refills | Status: DC

## 2020-03-26 ENCOUNTER — Encounter: Payer: Self-pay | Admitting: Family Medicine

## 2020-03-26 DIAGNOSIS — M7989 Other specified soft tissue disorders: Secondary | ICD-10-CM | POA: Insufficient documentation

## 2020-03-26 DIAGNOSIS — R2242 Localized swelling, mass and lump, left lower limb: Secondary | ICD-10-CM | POA: Insufficient documentation

## 2020-03-26 NOTE — Assessment & Plan Note (Signed)
DASH diet and commitment to daily physical activity for a minimum of 30 minutes discussed and encouraged, as a part of hypertension management. The importance of attaining a healthy weight is also discussed.  BP/Weight 03/22/2020 03/06/2020 01/20/2020 11/08/2019 09/29/2019 9/67/8938 1/0/1751  Systolic BP - - 025 852 778 242 353  Diastolic BP - - 80 82 85 81 81  Wt. (Lbs) 275 278 284 288 278 279 279  BMI 40.61 41.05 41.94 42.53 41.05 41.2 41.2

## 2020-03-26 NOTE — Assessment & Plan Note (Signed)
bilateral leg swelling , needs to reduce salt intake , elevate legs and intermittent use of lasix and potassium

## 2020-03-26 NOTE — Assessment & Plan Note (Signed)
Check uric acid level, if elevated, treat as gout

## 2020-03-26 NOTE — Assessment & Plan Note (Signed)
  Patient re-educated about  the importance of commitment to a  minimum of 150 minutes of exercise per week as able.  The importance of healthy food choices with portion control discussed, as well as eating regularly and within a 12 hour window most days. The need to choose "clean , green" food 50 to 75% of the time is discussed, as well as to make water the primary drink and set a goal of 64 ounces water daily.    Weight /BMI 03/22/2020 03/06/2020 01/20/2020  WEIGHT 275 lb 278 lb 284 lb  HEIGHT 5\' 9"  5\' 9"  5\' 9"   BMI 40.61 kg/m2 41.05 kg/m2 41.94 kg/m2

## 2020-03-29 ENCOUNTER — Other Ambulatory Visit: Payer: Self-pay

## 2020-03-29 ENCOUNTER — Encounter: Payer: Self-pay | Admitting: Internal Medicine

## 2020-03-29 ENCOUNTER — Ambulatory Visit (INDEPENDENT_AMBULATORY_CARE_PROVIDER_SITE_OTHER): Payer: PPO | Admitting: Internal Medicine

## 2020-03-29 VITALS — BP 144/69 | HR 76 | Temp 98.6°F | Resp 18 | Ht 69.0 in | Wt 276.1 lb

## 2020-03-29 DIAGNOSIS — I10 Essential (primary) hypertension: Secondary | ICD-10-CM | POA: Diagnosis not present

## 2020-03-29 DIAGNOSIS — M109 Gout, unspecified: Secondary | ICD-10-CM

## 2020-03-29 DIAGNOSIS — M7989 Other specified soft tissue disorders: Secondary | ICD-10-CM

## 2020-03-29 DIAGNOSIS — L304 Erythema intertrigo: Secondary | ICD-10-CM | POA: Diagnosis not present

## 2020-03-29 MED ORDER — KETOCONAZOLE 2 % EX CREA: 1.0000 "application " | TOPICAL_CREAM | Freq: Every day | CUTANEOUS | 0 refills | Status: DC

## 2020-03-29 NOTE — Assessment & Plan Note (Addendum)
bilateral leg swelling , needs to reduce salt intake , elevate legs and intermittent use of lasix and potassium  Advised to use compression stockings

## 2020-03-29 NOTE — Assessment & Plan Note (Signed)
BP Readings from Last 1 Encounters:  03/29/20 (!) 144/69   Elevated currently On Amlodipine and Bisoprolol-HCTZ Has not been taking Lasix Avoid adjusting any dose for now due to compliance concerns Counseled for compliance with the medications Advised DASH diet and moderate exercise/walking, at least 150 mins/week

## 2020-03-29 NOTE — Patient Instructions (Signed)
Please continue to take medications as prescribed.  Please take Furosemide every other day and take Potassium along with it.  Please try to keep legs elevated at nighttime and sit in a recliner chair while in sitting position if possible to help with leg swelling.  Please avoid prolonged standing.

## 2020-03-29 NOTE — Assessment & Plan Note (Signed)
Had it on left toe  Uric acid: 8.4 Has been placed on Allopurinol Will check uric acid level later

## 2020-03-29 NOTE — Progress Notes (Signed)
Established Patient Office Visit  Subjective:  Patient ID: Theresa Adkins, female    DOB: Oct 10, 1945  Age: 75 y.o. MRN: [id number]  CC:  Chief Complaint  Patient presents with  . Follow-up    Re eval leg swelling this is better when she is not on her feet. She did have a round of gout this is better and re eval bp     HPI Theresa Adkins is a 75 year old female with PMH of HTN, type 2 DM, HLD, breast ca and morbid obesity who presents for follow up of leg swelling.  She had been given Allopurinol for elevated uric acid, which she has been tolerating. She had an episode of gout on second digit of the left foot, which has improved now. She used Tart juice to help with the pain. She denies any fever, chills or foot pain.  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 She has not been taking Lasix and has not followed leg elevation instructions. Denies using compression stockings as they cause pain.  Her BP was elevated today. Takes medications regularly. Patient denies headache, dizziness, chest pain, dyspnea or palpitations.  She mentions having rash underneath her left breast, which causes itching sensation.   Past Medical History:  Diagnosis Date  . Allergic rhinitis, seasonal   . Arthritis   . Breast cancer Theresa Adkins) dx 02/ 2015---  oncologist-  Theresa Adkins/ Theresa Adkins   dx Right breast upper-outer quadrant DCIS, high grade (Tis N0), ER+, PR negative--- 07-01-2013  s/p  right breast lumpectomy w/ sln bxs and radiation therapy (08-24-2013 to 09-20-2013)  . Diabetes mellitus type II   . Diabetic neuropathy ([place])   . Heart murmur   . Hidradenitis axillaris    chronic  . History of adenomatous polyp of colon    03-04-2008  tubular adenoma  . History of radiation therapy 08-24-2013 to 09-20-2013   total 50Gy  . Hyperlipidemia   . Hypertension   . Personal history of radiation therapy   .  PMB (postmenopausal bleeding) 05/14/2016  . Thickened endometrium   . Wears dentures    upper  . Wears glasses     Past Surgical History:  Procedure Laterality Date  . BREAST BIOPSY Right 04/16/2016  . BREAST LUMPECTOMY Right 08/2013  . BREAST LUMPECTOMY WITH NEEDLE LOCALIZATION AND AXILLARY SENTINEL LYMPH NODE BX Right 07/01/2013   Procedure: BREAST LUMPECTOMY WITH NEEDLE LOCALIZATION AND AXILLARY SENTINEL LYMPH NODE BX;  Surgeon: Merrie Roof, MD;  Location: [facility];  Service: General;  Laterality: Right;  . BREAST SURGERY  1970s   benign tumor removed from unspecified breast   . COLONOSCOPY N/A 11/27/2018   Procedure: COLONOSCOPY;  Surgeon: Theresa Binder, MD;  [facility];  Service: Endoscopy;  Laterality: N/A;  8:30  . COLONOSCOPY W/ POLYPECTOMY  03/04/2008  . DILATATION & CURRETTAGE/HYSTEROSCOPY WITH RESECTOCOPE N/A 05/14/2016   Procedure: [place];  Surgeon: Theresa Manges, MD;  Location: [facility];  Service: Gynecology;  Laterality: N/A;  . POLYPECTOMY  11/27/2018   Procedure: POLYPECTOMY;  Surgeon: Theresa Binder, MD;  [facility];  Service: Endoscopy;;  colon    Family History  Problem Relation Age of Onset  . Cancer Brother 65       bone   . Obesity Sister   . Heart  disease Mother   . Cancer Sister 9       leukemia   . Obesity Sister     Social History   Socioeconomic History  . Marital status: Divorced    Spouse name: Not on file  . Number of children: 1  . Years of education: Not on file  . Highest education level: Not on file  Occupational History  . Occupation: Employed   Tobacco Use  . Smoking status: Former Smoker    Packs/day: 1.00    Years: 25.00    Pack years: 25.00    Types: Cigarettes    Quit date: 02/17/2002    Years since quitting: 18.1  . Smokeless tobacco: Never Used  Substance and Sexual Activity  . Alcohol use: No  . Drug use: No  . Sexual  activity: Never  Other Topics Concern  . Not on file  Social History Narrative  . Not on file   Social Determinants of Health   Financial Resource Strain: Low Risk   . Difficulty of Paying Living Expenses: Not hard at all  Food Insecurity: No Food Insecurity  . Worried About Charity fundraiser in the Last Year: Never true  . Ran Out of Food in the Last Year: Never true  Transportation Needs: No Transportation Needs  . Lack of Transportation (Medical): No  . Lack of Transportation (Non-Medical): No  Physical Activity: Inactive  . Days of Exercise per Week: 0 days  . Minutes of Exercise per Session: 0 min  Stress: No Stress Concern Present  . Feeling of Stress : Not at all  Social Connections: Socially Isolated  . Frequency of Communication with Friends and Family: More than three times a week  . Frequency of Social Gatherings with Friends and Family: More than three times a week  . Attends Religious Services: Never  . Active Member of Clubs or Organizations: No  . Attends Archivist Meetings: Never  . Marital Status: Divorced  Human resources officer Violence: Not At Risk  . Fear of Current or Ex-Partner: No  . Emotionally Abused: No  . Physically Abused: No  . Sexually Abused: No    Outpatient Medications Prior to Visit  Medication Sig Dispense Refill  . allopurinol (ZYLOPRIM) 300 MG tablet Take 1 tablet (300 mg total) by mouth daily. 30 tablet 6  . amLODipine (NORVASC) 10 MG tablet Take 1 tablet (10 mg total) by mouth daily. 90 tablet 1  . atorvastatin (LIPITOR) 10 MG tablet Take 1 tablet by mouth once daily 90 tablet 3  . bisoprolol-hydrochlorothiazide (ZIAC) 10-6.25 MG tablet Take 1 tablet by mouth once daily 90 tablet 3  . Cholecalciferol (VITAMIN D3) 50 MCG (2000 UT) CHEW Chew 4,000 Units by mouth daily.     . ferrous sulfate 325 (65 FE) MG tablet Take 325 mg by mouth daily with breakfast.    . gabapentin (NEURONTIN) 300 MG capsule Take 1 capsule (300 mg total) by  mouth at bedtime. 30 capsule 3  . metFORMIN (GLUCOPHAGE) 1000 MG tablet TAKE 1 TABLET BY MOUTH ONCE DAILY WITH A MEAL. 90 tablet 0  . Multiple Vitamin (MULTIVITAMIN) tablet Take 1 tablet by mouth daily.    Theresa Adkins UNABLE TO FIND Diabetic shoes x 1  Inserts x 3 pair  Dx e11.9 1 each 0  . furosemide (LASIX) 20 MG tablet Take 1 tablet (20 mg total) by mouth daily. (Patient not taking: Reported on 03/29/2020) 30 tablet 0  . potassium chloride SA (KLOR-CON) 20 MEQ  tablet Take 1 tablet (20 mEq total) by mouth daily. (Patient not taking: Reported on 03/29/2020) 30 tablet 0   No facility-administered medications prior to visit.    Allergies  Allergen Reactions  . Ace Inhibitors Other (See Comments)    Cough  . Aspirin Nausea Only    ROS Review of Systems  Constitutional: Negative for chills and fever.  HENT: Negative for congestion, sinus pressure, sinus pain and sore throat.   Eyes: Negative for pain and discharge.  Respiratory: Negative for cough and shortness of breath.   Cardiovascular: Positive for leg swelling. Negative for chest pain and palpitations.  Gastrointestinal: Negative for abdominal pain, constipation, diarrhea, nausea and vomiting.  Endocrine: Negative for polydipsia and polyuria.  Genitourinary: Negative for dysuria and hematuria.  Musculoskeletal: Positive for arthralgias and joint swelling. Negative for neck pain and neck stiffness.  Skin: Negative for rash.  Neurological: Negative for dizziness and weakness.  Psychiatric/Behavioral: Negative for agitation and behavioral problems.      Objective:    Physical Exam Vitals reviewed.  Constitutional:      General: She is not in acute distress.    Appearance: She is not diaphoretic.  HENT:     Head: Normocephalic and atraumatic.     Nose: Nose normal. No congestion.     Mouth/Throat:     Mouth: Mucous membranes are moist.     Pharynx: No posterior oropharyngeal erythema.  Eyes:     General: No scleral icterus.     Extraocular Movements: Extraocular movements intact.     Pupils: Pupils are equal, round, and reactive to light.  Cardiovascular:     Rate and Rhythm: Normal rate and regular rhythm.     Pulses: Normal pulses.     Heart sounds: Normal heart sounds. No murmur heard.   Pulmonary:     Breath sounds: Normal breath sounds. No wheezing or rales.  Abdominal:     Palpations: Abdomen is soft.     Tenderness: There is no abdominal tenderness.  Musculoskeletal:     Cervical back: Neck supple. No tenderness.     Right lower leg: Edema (1+) present.     Left lower leg: Edema (1+) present.  Skin:    General: Skin is warm.     Comments: Whitish scales underneath the left breast area  Neurological:     General: No focal deficit present.     Mental Status: She is alert and oriented to person, place, and time.  Psychiatric:        Mood and Affect: Mood normal.        Behavior: Behavior normal.     BP (!) 144/69 (BP Location: Right Arm, Patient Position: Sitting, Cuff Size: Normal)   Pulse 76   Temp 98.6 F (37 C) (Oral)   Resp 18   Ht 5\' 9"  (1.753 m)   Wt 276 lb 1.9 oz (125.2 kg)   SpO2 95%   BMI 40.78 kg/m  Wt Readings from Last 3 Encounters:  03/29/20 276 lb 1.9 oz (125.2 kg)  03/22/20 275 lb (124.7 kg)  03/06/20 278 lb (126.1 kg)     There are no preventive care reminders to display for this patient.  There are no preventive care reminders to display for this patient.  Lab Results  Component Value Date   TSH 2.58 11/02/2019   Lab Results  Component Value Date   WBC 6.3 11/02/2019   HGB 11.3 (L) 11/02/2019   HCT 34.9 (L) 11/02/2019   MCV  81.5 11/02/2019   PLT 343 11/02/2019   Lab Results  Component Value Date   NA 142 03/23/2020   K 4.2 03/23/2020   CO2 23 03/23/2020   GLUCOSE 161 (H) 03/23/2020   BUN 10 03/23/2020   CREATININE 0.91 03/23/2020   BILITOT 0.5 03/02/2020   ALKPHOS 69 03/02/2020   AST 19 03/02/2020   ALT 14 03/02/2020   PROT 7.5 03/02/2020    ALBUMIN 4.5 03/02/2020   CALCIUM 9.4 03/23/2020   Lab Results  Component Value Date   CHOL 171 03/02/2020   Lab Results  Component Value Date   HDL 46 03/02/2020   Lab Results  Component Value Date   LDLCALC 90 03/02/2020   Lab Results  Component Value Date   TRIG 207 (H) 03/02/2020   Lab Results  Component Value Date   CHOLHDL 3.7 03/02/2020   Lab Results  Component Value Date   HGBA1C 8.0 (H) 03/02/2020      Assessment & Plan:   Problem List Items Addressed This Visit      Cardiovascular and Mediastinum   HTN (hypertension) - Primary    BP Readings from Last 1 Encounters:  03/29/20 (!) 144/69   Elevated currently On Amlodipine and Bisoprolol-HCTZ Has not been taking Lasix Avoid adjusting any dose for now due to compliance concerns Counseled for compliance with the medications Advised DASH diet and moderate exercise/walking, at least 150 mins/week         Other   Leg swelling    bilateral leg swelling , needs to reduce salt intake , elevate legs and intermittent use of lasix and potassium  Advised to use compression stockings      Gout    Had it on left toe  Uric acid: 8.4 Has been placed on Allopurinol Will check uric acid level later       Other Visit Diagnoses    Intertrigo       Relevant Medications   ketoconazole (NIZORAL) 2 % cream      Meds ordered this encounter  Medications  . ketoconazole (NIZORAL) 2 % cream    Sig: Apply 1 application topically daily.    Dispense:  15 g    Refill:  0    Follow-up: Return in about 4 months (around 07/27/2020).    Lindell Spar, MD

## 2020-04-05 ENCOUNTER — Other Ambulatory Visit: Payer: Self-pay | Admitting: Family Medicine

## 2020-04-19 ENCOUNTER — Other Ambulatory Visit: Payer: Self-pay

## 2020-04-19 ENCOUNTER — Telehealth (INDEPENDENT_AMBULATORY_CARE_PROVIDER_SITE_OTHER): Payer: PPO | Admitting: Family Medicine

## 2020-04-19 ENCOUNTER — Encounter: Payer: Self-pay | Admitting: Family Medicine

## 2020-04-19 VITALS — Ht 69.0 in | Wt 270.0 lb

## 2020-04-19 DIAGNOSIS — E559 Vitamin D deficiency, unspecified: Secondary | ICD-10-CM

## 2020-04-19 DIAGNOSIS — E782 Mixed hyperlipidemia: Secondary | ICD-10-CM | POA: Diagnosis not present

## 2020-04-19 DIAGNOSIS — E790 Hyperuricemia without signs of inflammatory arthritis and tophaceous disease: Secondary | ICD-10-CM | POA: Diagnosis not present

## 2020-04-19 DIAGNOSIS — R21 Rash and other nonspecific skin eruption: Secondary | ICD-10-CM

## 2020-04-19 DIAGNOSIS — I10 Essential (primary) hypertension: Secondary | ICD-10-CM | POA: Diagnosis not present

## 2020-04-19 DIAGNOSIS — E1140 Type 2 diabetes mellitus with diabetic neuropathy, unspecified: Secondary | ICD-10-CM | POA: Diagnosis not present

## 2020-04-19 NOTE — Patient Instructions (Addendum)
Please reschedule office follow up to April 28 , morning appt with me, re evaluate rash and blood sugar, call if you need me sooner  Continue to test blood sugar regularly, fasting goal is 80 to 130  2 hours after any meal or bedtime is 130 to 180  Please  get fasting lipid, cmp and EGFr and HBa1C and uric acid level, April 13 or shortly after  Think about what you will eat, plan ahead. Choose " clean, green, fresh or frozen" over canned, processed or packaged foods which are more sugary, salty and fatty. 70 to 75% of food eaten should be vegetables and fruit. Three meals at set times with snacks allowed between meals, but they must be fruit or vegetables. Aim to eat over a 12 hour period , example 7 am to 7 pm, and STOP after  your last meal of the day. Drink water,generally about 64 ounces per day, no other drink is as healthy. Fruit juice is best enjoyed in a healthy way, by EATING the fruit.  It is important that you exercise regularly at least 30 minutes 5 times a week. If you develop chest pain, have severe difficulty breathing, or feel very tired, stop exercising immediately and seek medical attention    Thanks for choosing [facility], we consider it a privelige to serve you.

## 2020-04-19 NOTE — Progress Notes (Signed)
Virtual Visit via Telephone Note  I connected with Theresa Adkins on 04/19/20 at  2:40 PM EST by telephone and verified that I am speaking with the correct person using two identifiers.  Location: Patient: home Provider: office   I discussed the limitations, risks, security and privacy concerns of performing an evaluation and management service by telephone and the availability of in person appointments. I also discussed with the patient that there may be a patient responsible charge related to this service. The patient expressed understanding and agreed to proceed.   History of Present Illness:   f/u uncontrolled diabetes,  testing regularly, and  when she does her FBG is seldom over 130 Still no regular exercise C/o black  Rash under breasts which she wants re evaluated at next visit Denies recent fever or chills. Denies sinus pressure, nasal congestion, ear pain or sore throat. Denies chest congestion, productive cough or wheezing. Denies chest pains, palpitations and leg swelling Denies abdominal pain, nausea, vomiting,diarrhea or constipation.   Denies dysuria, frequency, hesitancy or incontinence. Denies joint pain, swelling and limitation in mobility. Denies headaches, seizures, numbness, or tingling. Denies depression, anxiety or insomnia. Denies polyuria, polydipsia, blurred vision , or hypoglycemic episodes.      Observations/Objective: Ht 5\' 9"  (1.753 m)   Wt 270 lb (122.5 kg)   BMI 39.87 kg/m  Good communication with no confusion and intact memory. Alert and oriented x 3 No signs of respiratory distress during speech    Assessment and Plan: Controlled type 2 diabetes with neuropathy (HCC) Uncontrolled , but reporting improved blood sugar, re evall in full at next visit. Starting and exercise program is again encouraged Theresa Adkins is reminded of the importance of commitment to daily physical activity for 30 minutes or more, as able and the need to limit  carbohydrate intake to 30 to 60 grams per meal to help with blood sugar control.   The need to take medication as prescribed, test blood sugar as directed, and to call between visits if there is a concern that blood sugar is uncontrolled is also discussed.   Theresa Adkins is reminded of the importance of daily foot exam, annual eye examination, and good blood sugar, blood pressure and cholesterol control.  Diabetic Labs Latest Ref Rng & Units 03/23/2020 03/02/2020 11/08/2019 11/02/2019 08/02/2019  HbA1c 4.8 - 5.6 % - 8.0(H) 7.4(A) - 7.7(A)  Microalbumin mg/dL - - - 3.3 -  Micro/Creat Ratio <30 mcg/mg creat - - - - -  Chol 100 - 199 mg/dL - 171 - 174 -  HDL >39 mg/dL - 46 - 54 -  Calc LDL 0 - 99 mg/dL - 90 - 89 -  Triglycerides 0 - 149 mg/dL - 207(H) - 226(H) -  Creatinine 0.57 - 1.00 mg/dL 0.91 0.83 - 0.70 -   BP/Weight 04/19/2020 03/29/2020 03/22/2020 03/06/2020 01/20/2020 11/08/2019 3/41/9622  Systolic BP - 297 - - 989 211 941  Diastolic BP - 69 - - 80 82 85  Wt. (Lbs) 270 276.12 275 278 284 288 278  BMI 39.87 40.78 40.61 41.05 41.94 42.53 41.05   Foot/eye exam completion dates Latest Ref Rng & Units 01/20/2020 08/16/2019  Eye Exam No Retinopathy - No Retinopathy  Foot exam Order - - -  Foot Form Completion - Done -        Morbid obesity ([place])  Patient re-educated about  the importance of commitment to a  minimum of 150 minutes of exercise per week as able.  The importance  of healthy food choices with portion control discussed, as well as eating regularly and within a 12 hour window most days. The need to choose "clean , green" food 50 to 75% of the time is discussed, as well as to make water the primary drink and set a goal of 64 ounces water daily.    Weight /BMI 04/19/2020 03/29/2020 03/22/2020  WEIGHT 270 lb 276 lb 1.9 oz 275 lb  HEIGHT 5\' 9"  5\' 9"  5\' 9"   BMI 39.87 kg/m2 40.78 kg/m2 40.61 kg/m2      HTN (hypertension) DASH diet and commitment to daily physical activity for a minimum of 30  minutes discussed and encouraged, as a part of hypertension management. The importance of attaining a healthy weight is also discussed.  BP/Weight 04/19/2020 03/29/2020 03/22/2020 03/06/2020 01/20/2020 11/08/2019 05/28/7351  Systolic BP - 299 - - 242 683 419  Diastolic BP - 69 - - 80 82 85  Wt. (Lbs) 270 276.12 275 278 284 288 278  BMI 39.87 40.78 40.61 41.05 41.94 42.53 41.05         Follow Up Instructions:    I discussed the assessment and treatment plan with the patient. The patient was provided an opportunity to ask questions and all were answered. The patient agreed with the plan and demonstrated an understanding of the instructions.   The patient was advised to call back or seek an in-person evaluation if the symptoms worsen or if the condition fails to improve as anticipated.  I provided 14 minutes of non-face-to-face time during this encounter.   [name], MD

## 2020-04-22 ENCOUNTER — Other Ambulatory Visit: Payer: Self-pay | Admitting: Family Medicine

## 2020-04-23 ENCOUNTER — Encounter: Payer: Self-pay | Admitting: Family Medicine

## 2020-04-23 DIAGNOSIS — R21 Rash and other nonspecific skin eruption: Secondary | ICD-10-CM | POA: Insufficient documentation

## 2020-04-23 NOTE — Assessment & Plan Note (Signed)
DASH diet and commitment to daily physical activity for a minimum of 30 minutes discussed and encouraged, as a part of hypertension management. The importance of attaining a healthy weight is also discussed.  BP/Weight 04/19/2020 03/29/2020 03/22/2020 03/06/2020 01/20/2020 11/08/2019 09/12/3662  Systolic BP - 403 - - 474 259 563  Diastolic BP - 69 - - 80 82 85  Wt. (Lbs) 270 276.12 275 278 284 288 278  BMI 39.87 40.78 40.61 41.05 41.94 42.53 41.05

## 2020-04-23 NOTE — Assessment & Plan Note (Signed)
Uncontrolled , but reporting improved blood sugar, re evall in full at next visit. Starting and exercise program is again encouraged Theresa Adkins is reminded of the importance of commitment to daily physical activity for 30 minutes or more, as able and the need to limit carbohydrate intake to 30 to 60 grams per meal to help with blood sugar control.   The need to take medication as prescribed, test blood sugar as directed, and to call between visits if there is a concern that blood sugar is uncontrolled is also discussed.   Theresa Adkins is reminded of the importance of daily foot exam, annual eye examination, and good blood sugar, blood pressure and cholesterol control.  Diabetic Labs Latest Ref Rng & Units 03/23/2020 03/02/2020 11/08/2019 11/02/2019 08/02/2019  HbA1c 4.8 - 5.6 % - 8.0(H) 7.4(A) - 7.7(A)  Microalbumin mg/dL - - - 3.3 -  Micro/Creat Ratio <30 mcg/mg creat - - - - -  Chol 100 - 199 mg/dL - 171 - 174 -  HDL >39 mg/dL - 46 - 54 -  Calc LDL 0 - 99 mg/dL - 90 - 89 -  Triglycerides 0 - 149 mg/dL - 207(H) - 226(H) -  Creatinine 0.57 - 1.00 mg/dL 0.91 0.83 - 0.70 -   BP/Weight 04/19/2020 03/29/2020 03/22/2020 03/06/2020 01/20/2020 11/08/2019 09/12/2033  Systolic BP - 597 - - 416 384 536  Diastolic BP - 69 - - 80 82 85  Wt. (Lbs) 270 276.12 275 278 284 288 278  BMI 39.87 40.78 40.61 41.05 41.94 42.53 41.05   Foot/eye exam completion dates Latest Ref Rng & Units 01/20/2020 08/16/2019  Eye Exam No Retinopathy - No Retinopathy  Foot exam Order - - -  Foot Form Completion - Done -

## 2020-04-23 NOTE — Assessment & Plan Note (Signed)
  Patient re-educated about  the importance of commitment to a  minimum of 150 minutes of exercise per week as able.  The importance of healthy food choices with portion control discussed, as well as eating regularly and within a 12 hour window most days. The need to choose "clean , green" food 50 to 75% of the time is discussed, as well as to make water the primary drink and set a goal of 64 ounces water daily.    Weight /BMI 04/19/2020 03/29/2020 03/22/2020  WEIGHT 270 lb 276 lb 1.9 oz 275 lb  HEIGHT 5\' 9"  5\' 9"  5\' 9"   BMI 39.87 kg/m2 40.78 kg/m2 40.61 kg/m2

## 2020-05-11 ENCOUNTER — Telehealth: Payer: Self-pay

## 2020-05-11 ENCOUNTER — Other Ambulatory Visit: Payer: Self-pay

## 2020-05-11 MED ORDER — RELION PREMIER TEST VI STRP: ORAL_STRIP | 5 refills | Status: DC

## 2020-05-11 NOTE — Telephone Encounter (Signed)
Patient needing refills sent to [facility] for rely on premier blood glucose test strips ph# [phone]

## 2020-05-11 NOTE — Telephone Encounter (Signed)
Refills sent

## 2020-05-19 ENCOUNTER — Other Ambulatory Visit: Payer: Self-pay | Admitting: Family Medicine

## 2020-05-19 DIAGNOSIS — Z1231 Encounter for screening mammogram for malignant neoplasm of breast: Secondary | ICD-10-CM

## 2020-06-04 ENCOUNTER — Other Ambulatory Visit: Payer: Self-pay | Admitting: Family Medicine

## 2020-06-04 DIAGNOSIS — E785 Hyperlipidemia, unspecified: Secondary | ICD-10-CM

## 2020-06-07 ENCOUNTER — Ambulatory Visit: Payer: PPO | Admitting: Family Medicine

## 2020-06-09 DIAGNOSIS — E790 Hyperuricemia without signs of inflammatory arthritis and tophaceous disease: Secondary | ICD-10-CM | POA: Diagnosis not present

## 2020-06-09 DIAGNOSIS — E1140 Type 2 diabetes mellitus with diabetic neuropathy, unspecified: Secondary | ICD-10-CM | POA: Diagnosis not present

## 2020-06-09 DIAGNOSIS — E782 Mixed hyperlipidemia: Secondary | ICD-10-CM | POA: Diagnosis not present

## 2020-06-10 LAB — CMP14+EGFR
ALT: 12 IU/L (ref 0–32)
AST: 14 IU/L (ref 0–40)
Albumin/Globulin Ratio: 1.5 (ref 1.2–2.2)
Albumin: 4.4 g/dL (ref 3.7–4.7)
Alkaline Phosphatase: 69 IU/L (ref 44–121)
BUN/Creatinine Ratio: 12 (ref 12–28)
BUN: 7 mg/dL — ABNORMAL LOW (ref 8–27)
Bilirubin Total: 0.4 mg/dL (ref 0.0–1.2)
CO2: 24 mmol/L (ref 20–29)
Calcium: 9.5 mg/dL (ref 8.7–10.3)
Chloride: 99 mmol/L (ref 96–106)
Creatinine, Ser: 0.59 mg/dL (ref 0.57–1.00)
Globulin, Total: 3 g/dL (ref 1.5–4.5)
Glucose: 144 mg/dL — ABNORMAL HIGH (ref 65–99)
Potassium: 4.3 mmol/L (ref 3.5–5.2)
Sodium: 143 mmol/L (ref 134–144)
Total Protein: 7.4 g/dL (ref 6.0–8.5)
eGFR: 95 mL/min/{1.73_m2} (ref >59)

## 2020-06-10 LAB — LIPID PANEL
Chol/HDL Ratio: 3.9 ratio (ref 0.0–4.4)
Cholesterol, Total: 189 mg/dL (ref 100–199)
HDL: 49 mg/dL (ref >39)
LDL Chol Calc (NIH): 88 mg/dL (ref 0–99)
Triglycerides: 320 mg/dL — ABNORMAL HIGH (ref 0–149)
VLDL Cholesterol Cal: 52 mg/dL — ABNORMAL HIGH (ref 5–40)

## 2020-06-10 LAB — URIC ACID: Uric Acid: 5.3 mg/dL (ref 3.1–7.9)

## 2020-06-10 LAB — HEMOGLOBIN A1C
Est. average glucose Bld gHb Est-mCnc: 174 mg/dL
Hgb A1c MFr Bld: 7.7 % — ABNORMAL HIGH (ref 4.8–5.6)

## 2020-06-13 ENCOUNTER — Ambulatory Visit: Payer: PPO | Admitting: Family Medicine

## 2020-06-15 ENCOUNTER — Other Ambulatory Visit: Payer: Self-pay

## 2020-06-15 ENCOUNTER — Ambulatory Visit (INDEPENDENT_AMBULATORY_CARE_PROVIDER_SITE_OTHER): Payer: PPO | Admitting: Family Medicine

## 2020-06-15 ENCOUNTER — Encounter: Payer: Self-pay | Admitting: Family Medicine

## 2020-06-15 VITALS — BP 130/69 | HR 80 | Resp 16 | Ht 69.0 in | Wt 277.0 lb

## 2020-06-15 DIAGNOSIS — M542 Cervicalgia: Secondary | ICD-10-CM | POA: Diagnosis not present

## 2020-06-15 DIAGNOSIS — R21 Rash and other nonspecific skin eruption: Secondary | ICD-10-CM | POA: Diagnosis not present

## 2020-06-15 DIAGNOSIS — I10 Essential (primary) hypertension: Secondary | ICD-10-CM | POA: Diagnosis not present

## 2020-06-15 DIAGNOSIS — E1140 Type 2 diabetes mellitus with diabetic neuropathy, unspecified: Secondary | ICD-10-CM | POA: Diagnosis not present

## 2020-06-15 DIAGNOSIS — E782 Mixed hyperlipidemia: Secondary | ICD-10-CM

## 2020-06-15 DIAGNOSIS — M7989 Other specified soft tissue disorders: Secondary | ICD-10-CM | POA: Diagnosis not present

## 2020-06-15 DIAGNOSIS — E559 Vitamin D deficiency, unspecified: Secondary | ICD-10-CM

## 2020-06-15 MED ORDER — FLUCONAZOLE 100 MG PO TABS: 100.0000 mg | ORAL_TABLET | Freq: Every day | ORAL | 0 refills | Status: AC

## 2020-06-15 MED ORDER — GABAPENTIN 100 MG PO CAPS: ORAL_CAPSULE | ORAL | 0 refills | Status: DC

## 2020-06-15 MED ORDER — AMLODIPINE BESYLATE 10 MG PO TABS: 10.0000 mg | ORAL_TABLET | Freq: Every day | ORAL | 3 refills | Status: DC

## 2020-06-15 MED ORDER — POTASSIUM CHLORIDE CRYS ER 20 MEQ PO TBCR: 20.0000 meq | EXTENDED_RELEASE_TABLET | Freq: Every day | ORAL | 3 refills | Status: DC

## 2020-06-15 MED ORDER — FUROSEMIDE 20 MG PO TABS
20.0000 mg | ORAL_TABLET | Freq: Every day | ORAL | 3 refills | Status: DC
Start: 2020-06-15 — End: 2021-02-02

## 2020-06-15 MED ORDER — TERBINAFINE HCL 250 MG PO TABS: 250.0000 mg | ORAL_TABLET | Freq: Every day | ORAL | 0 refills | Status: DC

## 2020-06-15 NOTE — Patient Instructions (Addendum)
F/U in 3.5 months in office , ca;ll if you need me sooner  Td ( tetanus) is due , please check your pharmacist for cost and get at the [facility] if you decide to  Please schedule neurosurgery evaluation for abnormal brain scan at checkout, patient is now agreeing to get this done    Medications sent for leg swelling, fungal and yeast infections and arm pain  Do not take atorvastatin on the days you taker the fluconazole tablet  Use cornstarch to keep skin folds dry and lower risk if infection  Congrats on improved blood sugar  Non fast HBa1C, chem 7 and eGFr, cBC, tSH and vit D 1 week before next appointment   Thanks for choosing [facility], we consider it a privelige to serve you.

## 2020-06-19 ENCOUNTER — Encounter: Payer: Self-pay | Admitting: Family Medicine

## 2020-06-19 NOTE — Progress Notes (Signed)
Theresa Adkins     MRN: [id number]      DOB: June 03, 1945   HPI Theresa Adkins is here for follow up and re-evaluation of chronic medical conditions, medication management and review of any available recent lab and radiology data.  Preventive health is updated, specifically  Cancer screening and Immunization.   Questions or concerns regarding consultations or procedures which the PT has had in the interim are  addressed. The PT denies any adverse reactions to current medications since the last visit.  C/o intermittent leg swelling, has benefited from lasix in the past. Denies pND or orthopnea C/o arm numbness, will keep a[ppt with neurosurgery Denies polyuria, polydipsia, blurred vision , or hypoglycemic episodes.    ROS Denies recent fever or chills. Denies sinus pressure, nasal congestion, ear pain or sore throat. Denies chest congestion, productive cough or wheezing. Denies chest pains, palpitations  Denies abdominal pain, nausea, vomiting,diarrhea or constipation.   Denies dysuria, frequency, hesitancy or incontinence. . Denies headaches, seizures, . Denies depression, anxiety or insomnia. Denies skin break down or rash.   PE  BP 130/69   Pulse 80   Resp 16   Ht 5\' 9"  (1.753 m)   Wt 277 lb (125.6 kg)   SpO2 94%   BMI 40.91 kg/m   Patient alert and oriented and in no cardiopulmonary distress.  HEENT: No facial asymmetry, EOMI,     Neck supple .  Chest: Clear to auscultation bilaterally.  CVS: S1, S2 no murmurs, no S3.Regular rate.  ABD: Soft non tender.   Ext: one plus pitting  edema  MS: decreased  ROM spine, shoulders, hips and knees.  Skin: Intact, no ulcerations or rash noted.  Psych: Good eye contact, normal affect. Memory intact not anxious or depressed appearing.  CNS: CN 2-12 intact, power,  normal throughout.no focal deficits noted.   Assessment & Plan  HTN (hypertension) Controlled, no change in medication DASH diet and commitment to daily physical  activity for a minimum of 30 minutes discussed and encouraged, as a part of hypertension management. The importance of attaining a healthy weight is also discussed.  BP/Weight 06/15/2020 04/19/2020 03/29/2020 03/22/2020 03/06/2020 01/20/2020 10/17/5619  Systolic BP 308 - 657 - - 846 962  Diastolic BP 69 - 69 - - 80 82  Wt. (Lbs) 277 270 276.12 275 278 284 288  BMI 40.91 39.87 40.78 40.61 41.05 41.94 42.53       Controlled type 2 diabetes with neuropathy [facility]) Theresa Adkins is reminded of the importance of commitment to daily physical activity for 30 minutes or more, as able and the need to limit carbohydrate intake to 30 to 60 grams per meal to help with blood sugar control.   The need to take medication as prescribed, test blood sugar as directed, and to call between visits if there is a concern that blood sugar is uncontrolled is also discussed.   Theresa Adkins is reminded of the importance of daily foot exam, annual eye examination, and good blood sugar, blood pressure and cholesterol control.  Diabetic Labs Latest Ref Rng & Units 06/09/2020 03/23/2020 03/02/2020 11/08/2019 11/02/2019  HbA1c 4.8 - 5.6 % 7.7(H) - 8.0(H) 7.4(A) -  Microalbumin mg/dL - - - - 3.3  Micro/Creat Ratio <30 mcg/mg creat - - - - -  Chol 100 - 199 mg/dL 189 - 171 - 174  HDL >39 mg/dL 49 - 46 - 54  Calc LDL 0 - 99 mg/dL 88 - 90 - 89  Triglycerides 0 -  149 mg/dL 320(H) - 207(H) - 226(H)  Creatinine 0.57 - 1.00 mg/dL 0.59 0.91 0.83 - 0.70   BP/Weight 06/15/2020 04/19/2020 03/29/2020 03/22/2020 03/06/2020 01/20/2020 6/38/4536  Systolic BP 468 - 032 - - 122 482  Diastolic BP 69 - 69 - - 80 82  Wt. (Lbs) 277 270 276.12 275 278 284 288  BMI 40.91 39.87 40.78 40.61 41.05 41.94 42.53   Foot/eye exam completion dates Latest Ref Rng & Units 01/20/2020 08/16/2019  Eye Exam No Retinopathy - No Retinopathy  Foot exam Order - - -  Foot Form Completion - Done -      Controlled, no change in medication   Rash and nonspecific skin eruption Fungal  and yeast infections in skin folds Terbinafine and fluconazole prescribed short erm, has topicals also  Leg swelling Resume furosemide and potassium  Hyperlipidemia Hyperlipidemia:Low fat diet discussed and encouraged.   Lipid Panel  Lab Results  Component Value Date   CHOL 189 06/09/2020   HDL 49 06/09/2020   LDLCALC 88 06/09/2020   TRIG 320 (H) 06/09/2020   CHOLHDL 3.9 06/09/2020    Needs to reduce fat in diet   Morbid obesity ([place])  Patient re-educated about  the importance of commitment to a  minimum of 150 minutes of exercise per week as able.  The importance of healthy food choices with portion control discussed, as well as eating regularly and within a 12 hour window most days. The need to choose "clean , green" food 50 to 75% of the time is discussed, as well as to make water the primary drink and set a goal of 64 ounces water daily.    Weight /BMI 06/15/2020 04/19/2020 03/29/2020  WEIGHT 277 lb 270 lb 276 lb 1.9 oz  HEIGHT 5\' 9"  5\' 9"  5\' 9"   BMI 40.91 kg/m2 39.87 kg/m2 40.78 kg/m2      Neck pain Radiates to arm, gabapentin prescribed, needs neurosurg eval for abn brain scan

## 2020-06-19 NOTE — Assessment & Plan Note (Signed)
Radiates to arm, gabapentin prescribed, needs neurosurg eval for abn brain scan

## 2020-06-19 NOTE — Assessment & Plan Note (Signed)
Controlled, no change in medication DASH diet and commitment to daily physical activity for a minimum of 30 minutes discussed and encouraged, as a part of hypertension management. The importance of attaining a healthy weight is also discussed.  BP/Weight 06/15/2020 04/19/2020 03/29/2020 03/22/2020 03/06/2020 01/20/2020 4/88/8916  Systolic BP 945 - 038 - - 882 800  Diastolic BP 69 - 69 - - 80 82  Wt. (Lbs) 277 270 276.12 275 278 284 288  BMI 40.91 39.87 40.78 40.61 41.05 41.94 42.53

## 2020-06-19 NOTE — Assessment & Plan Note (Signed)
Resume furosemide and potassium

## 2020-06-19 NOTE — Assessment & Plan Note (Signed)
  Patient re-educated about  the importance of commitment to a  minimum of 150 minutes of exercise per week as able.  The importance of healthy food choices with portion control discussed, as well as eating regularly and within a 12 hour window most days. The need to choose "clean , green" food 50 to 75% of the time is discussed, as well as to make water the primary drink and set a goal of 64 ounces water daily.    Weight /BMI 06/15/2020 04/19/2020 03/29/2020  WEIGHT 277 lb 270 lb 276 lb 1.9 oz  HEIGHT 5\' 9"  5\' 9"  5\' 9"   BMI 40.91 kg/m2 39.87 kg/m2 40.78 kg/m2

## 2020-06-19 NOTE — Assessment & Plan Note (Signed)
Fungal and yeast infections in skin folds Terbinafine and fluconazole prescribed short erm, has topicals also

## 2020-06-19 NOTE — Assessment & Plan Note (Signed)
Hyperlipidemia:Low fat diet discussed and encouraged.   Lipid Panel  Lab Results  Component Value Date   CHOL 189 06/09/2020   HDL 49 06/09/2020   LDLCALC 88 06/09/2020   TRIG 320 (H) 06/09/2020   CHOLHDL 3.9 06/09/2020    Needs to reduce fat in diet

## 2020-06-19 NOTE — Assessment & Plan Note (Signed)
Theresa Adkins is reminded of the importance of commitment to daily physical activity for 30 minutes or more, as able and the need to limit carbohydrate intake to 30 to 60 grams per meal to help with blood sugar control.   The need to take medication as prescribed, test blood sugar as directed, and to call between visits if there is a concern that blood sugar is uncontrolled is also discussed.   Theresa Adkins is reminded of the importance of daily foot exam, annual eye examination, and good blood sugar, blood pressure and cholesterol control.  Diabetic Labs Latest Ref Rng & Units 06/09/2020 03/23/2020 03/02/2020 11/08/2019 11/02/2019  HbA1c 4.8 - 5.6 % 7.7(H) - 8.0(H) 7.4(A) -  Microalbumin mg/dL - - - - 3.3  Micro/Creat Ratio <30 mcg/mg creat - - - - -  Chol 100 - 199 mg/dL 189 - 171 - 174  HDL >39 mg/dL 49 - 46 - 54  Calc LDL 0 - 99 mg/dL 88 - 90 - 89  Triglycerides 0 - 149 mg/dL 320(H) - 207(H) - 226(H)  Creatinine 0.57 - 1.00 mg/dL 0.59 0.91 0.83 - 0.70   BP/Weight 06/15/2020 04/19/2020 03/29/2020 03/22/2020 03/06/2020 01/20/2020 0/11/9321  Systolic BP 557 - 322 - - 025 427  Diastolic BP 69 - 69 - - 80 82  Wt. (Lbs) 277 270 276.12 275 278 284 288  BMI 40.91 39.87 40.78 40.61 41.05 41.94 42.53   Foot/eye exam completion dates Latest Ref Rng & Units 01/20/2020 08/16/2019  Eye Exam No Retinopathy - No Retinopathy  Foot exam Order - - -  Foot Form Completion - Done -      Controlled, no change in medication

## 2020-06-23 DIAGNOSIS — D329 Benign neoplasm of meninges, unspecified: Secondary | ICD-10-CM | POA: Diagnosis not present

## 2020-06-23 DIAGNOSIS — Z6841 Body Mass Index (BMI) 40.0 and over, adult: Secondary | ICD-10-CM | POA: Diagnosis not present

## 2020-06-23 DIAGNOSIS — I10 Essential (primary) hypertension: Secondary | ICD-10-CM | POA: Diagnosis not present

## 2020-06-23 DIAGNOSIS — M40202 Unspecified kyphosis, cervical region: Secondary | ICD-10-CM | POA: Diagnosis not present

## 2020-07-02 ENCOUNTER — Other Ambulatory Visit: Payer: Self-pay | Admitting: Family Medicine

## 2020-07-11 ENCOUNTER — Ambulatory Visit
Admission: RE | Admit: 2020-07-11 | Discharge: 2020-07-11 | Disposition: A | Discharge status: Home / Self Care | Payer: PPO | Source: Ambulatory Visit | Attending: Family Medicine | Admitting: Family Medicine

## 2020-07-11 ENCOUNTER — Other Ambulatory Visit: Payer: Self-pay

## 2020-07-11 DIAGNOSIS — Z1231 Encounter for screening mammogram for malignant neoplasm of breast: Secondary | ICD-10-CM

## 2020-07-24 ENCOUNTER — Other Ambulatory Visit: Payer: Self-pay | Admitting: Family Medicine

## 2020-08-15 ENCOUNTER — Other Ambulatory Visit: Payer: Self-pay | Admitting: Otolaryngology

## 2020-08-15 DIAGNOSIS — H52203 Unspecified astigmatism, bilateral: Secondary | ICD-10-CM | POA: Diagnosis not present

## 2020-08-15 DIAGNOSIS — H903 Sensorineural hearing loss, bilateral: Secondary | ICD-10-CM

## 2020-08-15 DIAGNOSIS — H2513 Age-related nuclear cataract, bilateral: Secondary | ICD-10-CM | POA: Diagnosis not present

## 2020-08-15 DIAGNOSIS — E1136 Type 2 diabetes mellitus with diabetic cataract: Secondary | ICD-10-CM | POA: Diagnosis not present

## 2020-08-15 DIAGNOSIS — H524 Presbyopia: Secondary | ICD-10-CM | POA: Diagnosis not present

## 2020-08-15 DIAGNOSIS — H5213 Myopia, bilateral: Secondary | ICD-10-CM | POA: Diagnosis not present

## 2020-08-15 DIAGNOSIS — H25013 Cortical age-related cataract, bilateral: Secondary | ICD-10-CM | POA: Diagnosis not present

## 2020-08-15 LAB — HM DIABETES EYE EXAM

## 2020-09-17 ENCOUNTER — Other Ambulatory Visit: Payer: Self-pay | Admitting: Family Medicine

## 2020-09-17 DIAGNOSIS — E785 Hyperlipidemia, unspecified: Secondary | ICD-10-CM

## 2020-10-01 ENCOUNTER — Other Ambulatory Visit: Payer: Self-pay | Admitting: Family Medicine

## 2020-10-02 ENCOUNTER — Ambulatory Visit: Payer: PPO | Admitting: Family Medicine

## 2020-10-03 ENCOUNTER — Ambulatory Visit (INDEPENDENT_AMBULATORY_CARE_PROVIDER_SITE_OTHER): Payer: PPO

## 2020-10-03 ENCOUNTER — Other Ambulatory Visit: Payer: Self-pay

## 2020-10-03 VITALS — BP 145/73 | HR 75 | Temp 98.3°F | Resp 20 | Ht 69.0 in | Wt 264.0 lb

## 2020-10-03 DIAGNOSIS — Z0000 Encounter for general adult medical examination without abnormal findings: Secondary | ICD-10-CM | POA: Diagnosis not present

## 2020-10-03 DIAGNOSIS — E1140 Type 2 diabetes mellitus with diabetic neuropathy, unspecified: Secondary | ICD-10-CM | POA: Diagnosis not present

## 2020-10-03 DIAGNOSIS — I10 Essential (primary) hypertension: Secondary | ICD-10-CM | POA: Diagnosis not present

## 2020-10-03 DIAGNOSIS — E559 Vitamin D deficiency, unspecified: Secondary | ICD-10-CM | POA: Diagnosis not present

## 2020-10-03 NOTE — Progress Notes (Signed)
Subjective:   Theresa Adkins is a 75 y.o. female who presents for Medicare Annual (Subsequent) preventive examination.        Objective:    There were no vitals filed for this visit. There is no height or weight on file to calculate BMI.  Advanced Directives 09/28/2019 11/27/2018 09/22/2017 06/24/2016 05/14/2016 08/09/2013 06/25/2013  Does Patient Have a Medical Advance Directive? No No No No Yes Patient does not have advance directive;Patient would not like information Patient does not have advance directive;Patient would not like information  Type of Advance Directive - - - - Living will;Healthcare Power of Attorney - -  Does patient want to make changes to medical advance directive? - - - - No - Patient declined - -  Copy of [place] in Chart? - - - - No - copy requested - -  Would patient like information on creating a medical advance directive? No - Patient declined No - Patient declined Yes (ED - Information included in AVS) No - Patient declined - - -    Current Medications (verified) Outpatient Encounter Medications as of 10/03/2020  Medication Sig   allopurinol (ZYLOPRIM) 300 MG tablet Take 1 tablet (300 mg total) by mouth daily. (Patient not taking: Reported on 06/15/2020)   amLODipine (NORVASC) 10 MG tablet Take 1 tablet (10 mg total) by mouth daily.   atorvastatin (LIPITOR) 10 MG tablet Take 1 tablet by mouth once daily   bisoprolol-hydrochlorothiazide (ZIAC) 10-6.25 MG tablet Take 1 tablet by mouth once daily   Cholecalciferol (VITAMIN D3) 50 MCG (2000 UT) CHEW Chew 4,000 Units by mouth daily.    ferrous sulfate 325 (65 FE) MG tablet Take 325 mg by mouth daily with breakfast.   furosemide (LASIX) 20 MG tablet Take 1 tablet (20 mg total) by mouth daily.   gabapentin (NEURONTIN) 100 MG capsule Take one capsule by mouth at bedtime as needed, for arm pain   glucose blood (RELION PREMIER TEST) test strip Use as instructed once daily dx e11.9   ketoconazole (NIZORAL)  2 % cream Apply 1 application topically daily.   metFORMIN (GLUCOPHAGE) 1000 MG tablet TAKE 1 TABLET BY MOUTH ONCE DAILY WITH A MEAL   Multiple Vitamin (MULTIVITAMIN) tablet Take 1 tablet by mouth daily.   potassium chloride SA (KLOR-CON) 20 MEQ tablet Take 1 tablet (20 mEq total) by mouth daily. (Patient not taking: Reported on 06/15/2020)   potassium chloride SA (KLOR-CON) 20 MEQ tablet Take 1 tablet (20 mEq total) by mouth daily.   terbinafine (LAMISIL) 250 MG tablet Take 1 tablet (250 mg total) by mouth daily.   UNABLE TO FIND Diabetic shoes x 1  Inserts x 3 pair  Dx e11.9   No facility-administered encounter medications on file as of 10/03/2020.    Allergies (verified) Ace inhibitors and Aspirin   History: Past Medical History:  Diagnosis Date   Allergic rhinitis, seasonal    Arthritis    Breast cancer [facility]) dx 02/ 2015---  oncologist-  dr shadad/ dr Lisbeth Renshaw   dx Right breast upper-outer quadrant DCIS, high grade (Tis N0), ER+, PR negative--- 07-01-2013  s/p  right breast lumpectomy w/ sln bxs and radiation therapy (08-24-2013 to 09-20-2013)   Diabetes mellitus type II    Diabetic neuropathy ([place])    Heart murmur    Hidradenitis axillaris    chronic   History of adenomatous polyp of colon    03-04-2008  tubular adenoma   History of radiation therapy 08-24-2013 to 09-20-2013  total 50Gy   Hyperlipidemia    Hypertension    Personal history of radiation therapy    PMB (postmenopausal bleeding) 05/14/2016   Thickened endometrium    Wears dentures    upper   Wears glasses    Past Surgical History:  Procedure Laterality Date   BREAST BIOPSY Right 04/16/2016   BREAST LUMPECTOMY Right 08/2013   BREAST LUMPECTOMY WITH NEEDLE LOCALIZATION AND AXILLARY SENTINEL LYMPH NODE BX Right 07/01/2013   Procedure: BREAST LUMPECTOMY WITH NEEDLE LOCALIZATION AND AXILLARY SENTINEL LYMPH NODE BX;  Surgeon: Merrie Roof, MD;  Location: [facility];  Service: General;  Laterality: Right;   BREAST  SURGERY  1970s   benign tumor removed from unspecified breast    COLONOSCOPY N/A 11/27/2018   Procedure: COLONOSCOPY;  Surgeon: Danie Binder, MD;  [facility];  Service: Endoscopy;  Laterality: N/A;  8:30   COLONOSCOPY W/ POLYPECTOMY  03/04/2008   DILATATION & CURRETTAGE/HYSTEROSCOPY WITH RESECTOCOPE N/A 05/14/2016   Procedure: [place];  Surgeon: Eldred Manges, MD;  Location: [facility];  Service: Gynecology;  Laterality: N/A;   POLYPECTOMY  11/27/2018   Procedure: POLYPECTOMY;  Surgeon: Danie Binder, MD;  [facility];  Service: Endoscopy;;  colon   Family History  Problem Relation Age of Onset   Cancer Brother 10       bone    Obesity Sister    Heart disease Mother    Cancer Sister 30       leukemia    Obesity Sister    Social History   Socioeconomic History   Marital status: Divorced    Spouse name: Not on file   Number of children: 1   Years of education: Not on file   Highest education level: Not on file  Occupational History   Occupation: Employed   Tobacco Use   Smoking status: Former    Packs/day: 1.00    Years: 25.00    Pack years: 25.00    Types: Cigarettes    Quit date: 02/17/2002    Years since quitting: 18.6   Smokeless tobacco: Never  Substance and Sexual Activity   Alcohol use: No   Drug use: No   Sexual activity: Never  Other Topics Concern   Not on file  Social History Narrative   Not on file   Social Determinants of Health   Financial Resource Strain: Not on file  Food Insecurity: Not on file  Transportation Needs: Not on file  Physical Activity: Not on file  Stress: Not on file  Social Connections: Not on file    Tobacco Counseling Counseling given: Not Answered   Clinical Intake:                 Diabetic? Yes          Activities of Daily Living No flowsheet data found.  Patient Care Team: Fayrene Helper, MD as PCP -  General Rothbart, Cristopher Estimable, MD (Inactive) (Cardiology) Jovita Kussmaul, MD as Consulting Physician (General Surgery) Rutherford Guys, MD as Consulting Physician (Ophthalmology)  Indicate any recent Medical Services you may have received from other than [facility] providers in the past year (date may be approximate).     Assessment:   This is a routine wellness examination for [place].  Hearing/Vision screen No results found.  Dietary issues and exercise activities discussed:     Goals Addressed   None   Depression Screen [facility] 2/9  Scores 04/19/2020 03/29/2020 03/22/2020 01/20/2020 09/28/2019 08/25/2019 08/02/2019  PHQ - 2 Score 0 0 0 0 0 2 0  PHQ- 9 Score - - - - - 2 -    Fall Risk Fall Risk  06/15/2020 04/19/2020 03/29/2020 03/22/2020 03/06/2020  Falls in the past year? 1 0 0 0 0  Number falls in past yr: 0 0 0 0 0  Injury with Fall? 0 0 0 0 0  Risk for fall due to : - No Fall Risks No Fall Risks No Fall Risks -  Follow up - Falls evaluation completed Falls evaluation completed Falls evaluation completed -    FALL RISK PREVENTION PERTAINING TO THE HOME:  Any stairs in or around the home? No  If so, are there any without handrails? No  Home free of loose throw rugs in walkways, pet beds, electrical cords, etc? Yes  Adequate lighting in your home to reduce risk of falls? Yes   ASSISTIVE DEVICES UTILIZED TO PREVENT FALLS:  Life alert? No  Use of a cane, walker or w/c? No  Grab bars in the bathroom? No  Shower chair or bench in shower? No  Elevated toilet seat or a handicapped toilet? No   TIMED UP AND GO:  Was the test performed? Yes .  Length of time to ambulate 10 feet: 40 sec.   Gait slow and steady with assistive device  Cognitive Function: MMSE - Mini Mental State Exam 04/28/2014  Orientation to time 5  Orientation to Place 5  Registration 3  Attention/ Calculation 5  Recall 3  Language- name 2 objects 2  Language- repeat 1  Language- follow 3 step command 3  Language- read &  follow direction 1  Write a sentence 1  Copy design 1  Total score 30     6CIT Screen 09/28/2019 09/25/2018 09/22/2017 06/24/2016  What Year? 0 points 0 points 0 points 0 points  What month? 0 points 0 points 0 points 0 points  What time? 0 points 0 points 0 points 0 points  Count back from 20 0 points 0 points 0 points 0 points  Months in reverse 0 points 0 points 0 points 0 points  Repeat phrase 0 points 0 points 0 points 0 points  Total Score 0 0 0 0    Immunizations Immunization History  Administered Date(s) Administered   Fluad Quad(high Dose 65+) 11/08/2019   Influenza Split 01/13/2012, 11/10/2018   Influenza,inj,Quad PF,6+ Mos 11/13/2012, 11/25/2013, 11/01/2014, 12/21/2015, 12/03/2016, 12/05/2017   PFIZER(Purple Top)SARS-COV-2 Vaccination 04/11/2019, 05/05/2019, 01/24/2020   Pneumococcal Conjugate-13 12/29/2014   Pneumococcal Polysaccharide-23 01/19/2016   Tdap 06/05/2010   Zoster, Live 06/05/2010    TDAP status: Up to date  Flu Vaccine status: Up to date  Pneumococcal vaccine status: Up to date  Covid-19 vaccine status: Completed vaccines  Qualifies for Shingles Vaccine? Yes   Zostavax completed Yes   Shingrix Completed?: Yes  Screening Tests Health Maintenance  Topic Date Due   Zoster Vaccines- Shingrix (1 of 2) 10/08/1964   COVID-19 Vaccine (4 - Booster for Pfizer series) 04/23/2020   INFLUENZA VACCINE  09/18/2020   TETANUS/TDAP  06/15/2021 (Originally 06/04/2020)   HEMOGLOBIN A1C  12/09/2020   FOOT EXAM  01/19/2021   OPHTHALMOLOGY EXAM  08/15/2021   MAMMOGRAM  07/12/2022   COLONOSCOPY (Pts 45-26yr Insurance coverage will need to be confirmed)  11/26/2028   DEXA SCAN  Completed   Hepatitis C Screening  Completed   PNA vac Low Risk Adult  Completed  HPV VACCINES  Aged Out    Health Maintenance  Health Maintenance Due  Topic Date Due   Zoster Vaccines- Shingrix (1 of 2) 10/08/1964   COVID-19 Vaccine (4 - Booster for Pfizer series) 04/23/2020    INFLUENZA VACCINE  09/18/2020    Colorectal cancer screening: Type of screening: Colonoscopy. Completed normal. Repeat every 10 years  Mammogram status: Completed normal. Repeat every year  Bone Density status: Completed normal. Results reflect: Bone density results: NORMAL. Repeat every 10 years.  Lung Cancer Screening: (Low Dose CT Chest recommended if Age 13-80 years, 30 pack-year currently smoking OR have quit w/in 15years.) does not qualify.    Additional Screening:  Hepatitis C Screening: does qualify; Completed.   Vision Screening: Recommended annual ophthalmology exams for early detection of glaucoma and other disorders of the eye. Is the patient up to date with their annual eye exam?  Yes  Who is the provider or what is the name of the office in which the patient attends annual eye exams? Dr. Gershon Crane  If pt is not established with a provider, would they like to be referred to a provider to establish care? No .   Dental Screening: Recommended annual dental exams for proper oral hygiene  Community Resource Referral / Chronic Care Management: CRR required this visit?  No   CCM required this visit?  No      Plan:     I have personally reviewed and noted the following in the patient's chart:   Medical and social history Use of alcohol, tobacco or illicit drugs  Current medications and supplements including opioid prescriptions.  Functional ability and status Nutritional status Physical activity Advanced directives List of other physicians Hospitalizations, surgeries, and ER visits in previous 12 months Vitals Screenings to include cognitive, depression, and falls Referrals and appointments  In addition, I have reviewed and discussed with patient certain preventive protocols, quality metrics, and best practice recommendations. A written personalized care plan for preventive services as well as general preventive health recommendations were provided to patient.      Lonn Georgia, LPN   [phone]   Nurse Notes: Pt consents to AWV via face to face, pt is present in the office during the time of visit and is utd on health maintenance goals. Provider is in the office today during the time of visit. This visit took approx 30 minutes to conduct.

## 2020-10-03 NOTE — Patient Instructions (Signed)
[name] , Thank you for taking time to come for your Medicare Wellness Visit. I appreciate your ongoing commitment to your health goals. Please review the following plan we discussed and let me know if I can assist you in the future.   Screening recommendations/referrals: Colonoscopy: 11/26/2028  Mammogram: 07/11/2021 Bone Density: Completed   Recommended yearly ophthalmology/optometry visit for glaucoma screening and checkup Recommended yearly dental visit for hygiene and checkup  Vaccinations: Influenza vaccine: Fall of 2022  Pneumococcal vaccine: Completed  Tdap vaccine: Up to date next due 2032 Shingles vaccine: Complete     Advanced directives: Working on these papers.   Conditions/risks identified: None   Next appointment: 10/06/20 @ 8:40 am   Preventive Care 65 Years and Older, Female Preventive care refers to lifestyle choices and visits with your health care provider that can promote health and wellness. What does preventive care include? A yearly physical exam. This is also called an annual well check. Dental exams once or twice a year. Routine eye exams. Ask your health care provider how often you should have your eyes checked. Personal lifestyle choices, including: Daily care of your teeth and gums. Regular physical activity. Eating a healthy diet. Avoiding tobacco and drug use. Limiting alcohol use. Practicing safe sex. Taking low-dose aspirin every day. Taking vitamin and mineral supplements as recommended by your health care provider. What happens during an annual well check? The services and screenings done by your health care provider during your annual well check will depend on your age, overall health, lifestyle risk factors, and family history of disease. Counseling  Your health care provider may ask you questions about your: Alcohol use. Tobacco use. Drug use. Emotional well-being. Home and relationship well-being. Sexual activity. Eating  habits. History of falls. Memory and ability to understand (cognition). Work and work Statistician. Reproductive health. Screening  You may have the following tests or measurements: Height, weight, and BMI. Blood pressure. Lipid and cholesterol levels. These may be checked every 5 years, or more frequently if you are over 73 years old. Skin check. Lung cancer screening. You may have this screening every year starting at age 73 if you have a 30-pack-year history of smoking and currently smoke or have quit within the past 15 years. Fecal occult blood test (FOBT) of the stool. You may have this test every year starting at age 18. Flexible sigmoidoscopy or colonoscopy. You may have a sigmoidoscopy every 5 years or a colonoscopy every 10 years starting at age 4. Hepatitis C blood test. Hepatitis B blood test. Sexually transmitted disease (STD) testing. Diabetes screening. This is done by checking your blood sugar (glucose) after you have not eaten for a while (fasting). You may have this done every 1-3 years. Bone density scan. This is done to screen for osteoporosis. You may have this done starting at age 7. Mammogram. This may be done every 1-2 years. Talk to your health care provider about how often you should have regular mammograms. Talk with your health care provider about your test results, treatment options, and if necessary, the need for more tests. Vaccines  Your health care provider may recommend certain vaccines, such as: Influenza vaccine. This is recommended every year. Tetanus, diphtheria, and acellular pertussis (Tdap, Td) vaccine. You may need a Td booster every 10 years. Zoster vaccine. You may need this after age 76. Pneumococcal 13-valent conjugate (PCV13) vaccine. One dose is recommended after age 65. Pneumococcal polysaccharide (PPSV23) vaccine. One dose is recommended after age 37. Talk to  your health care provider about which screenings and vaccines you need and how  often you need them. This information is not intended to replace advice given to you by your health care provider. Make sure you discuss any questions you have with your health care provider. Document Released: 03/03/2015 Document Revised: 10/25/2015 Document Reviewed: 12/06/2014 Elsevier Interactive Patient Education  2017 [employer] Prevention in the Home Falls can cause injuries. They can happen to people of all ages. There are many things you can do to make your home safe and to help prevent falls. What can I do on the outside of my home? Regularly fix the edges of walkways and driveways and fix any cracks. Remove anything that might make you trip as you walk through a door, such as a raised step or threshold. Trim any bushes or trees on the path to your home. Use bright outdoor lighting. Clear any walking paths of anything that might make someone trip, such as rocks or tools. Regularly check to see if handrails are loose or broken. Make sure that both sides of any steps have handrails. Any raised decks and porches should have guardrails on the edges. Have any leaves, snow, or ice cleared regularly. Use sand or salt on walking paths during winter. Clean up any spills in your garage right away. This includes oil or grease spills. What can I do in the bathroom? Use night lights. Install grab bars by the toilet and in the tub and shower. Do not use towel bars as grab bars. Use non-skid mats or decals in the tub or shower. If you need to sit down in the shower, use a plastic, non-slip stool. Keep the floor dry. Clean up any water that spills on the floor as soon as it happens. Remove soap buildup in the tub or shower regularly. Attach bath mats securely with double-sided non-slip rug tape. Do not have throw rugs and other things on the floor that can make you trip. What can I do in the bedroom? Use night lights. Make sure that you have a light by your bed that is easy to  reach. Do not use any sheets or blankets that are too big for your bed. They should not hang down onto the floor. Have a firm chair that has side arms. You can use this for support while you get dressed. Do not have throw rugs and other things on the floor that can make you trip. What can I do in the kitchen? Clean up any spills right away. Avoid walking on wet floors. Keep items that you use a lot in easy-to-reach places. If you need to reach something above you, use a strong step stool that has a grab bar. Keep electrical cords out of the way. Do not use floor polish or wax that makes floors slippery. If you must use wax, use non-skid floor wax. Do not have throw rugs and other things on the floor that can make you trip. What can I do with my stairs? Do not leave any items on the stairs. Make sure that there are handrails on both sides of the stairs and use them. Fix handrails that are broken or loose. Make sure that handrails are as long as the stairways. Check any carpeting to make sure that it is firmly attached to the stairs. Fix any carpet that is loose or worn. Avoid having throw rugs at the top or bottom of the stairs. If you do have throw rugs, attach  them to the floor with carpet tape. Make sure that you have a light switch at the top of the stairs and the bottom of the stairs. If you do not have them, ask someone to add them for you. What else can I do to help prevent falls? Wear shoes that: Do not have high heels. Have rubber bottoms. Are comfortable and fit you well. Are closed at the toe. Do not wear sandals. If you use a stepladder: Make sure that it is fully opened. Do not climb a closed stepladder. Make sure that both sides of the stepladder are locked into place. Ask someone to hold it for you, if possible. Clearly mark and make sure that you can see: Any grab bars or handrails. First and last steps. Where the edge of each step is. Use tools that help you move  around (mobility aids) if they are needed. These include: Canes. Walkers. Scooters. Crutches. Turn on the lights when you go into a dark area. Replace any light bulbs as soon as they burn out. Set up your furniture so you have a clear path. Avoid moving your furniture around. If any of your floors are uneven, fix them. If there are any pets around you, be aware of where they are. Review your medicines with your doctor. Some medicines can make you feel dizzy. This can increase your chance of falling. Ask your doctor what other things that you can do to help prevent falls. This information is not intended to replace advice given to you by your health care provider. Make sure you discuss any questions you have with your health care provider. Document Released: 12/01/2008 Document Revised: 07/13/2015 Document Reviewed: 03/11/2014 Elsevier Interactive Patient Education  2017 [employer].

## 2020-10-04 LAB — CBC
Hematocrit: 37 % (ref 34.0–46.6)
Hemoglobin: 12.1 g/dL (ref 11.1–15.9)
MCH: 26.5 pg — ABNORMAL LOW (ref 26.6–33.0)
MCHC: 32.7 g/dL (ref 31.5–35.7)
MCV: 81 fL (ref 79–97)
Platelets: 343 10*3/uL (ref 150–450)
RBC: 4.56 x10E6/uL (ref 3.77–5.28)
RDW: 15.5 % — ABNORMAL HIGH (ref 11.7–15.4)
WBC: 6.4 10*3/uL (ref 3.4–10.8)

## 2020-10-04 LAB — BMP8+EGFR
BUN/Creatinine Ratio: 14 (ref 12–28)
BUN: 11 mg/dL (ref 8–27)
CO2: 24 mmol/L (ref 20–29)
Calcium: 9.5 mg/dL (ref 8.7–10.3)
Chloride: 99 mmol/L (ref 96–106)
Creatinine, Ser: 0.76 mg/dL (ref 0.57–1.00)
Glucose: 137 mg/dL — ABNORMAL HIGH (ref 65–99)
Potassium: 4.2 mmol/L (ref 3.5–5.2)
Sodium: 140 mmol/L (ref 134–144)
eGFR: 82 mL/min/{1.73_m2} (ref >59)

## 2020-10-04 LAB — TSH: TSH: 1.87 u[IU]/mL (ref 0.450–4.500)

## 2020-10-04 LAB — HEMOGLOBIN A1C
Est. average glucose Bld gHb Est-mCnc: 163 mg/dL
Hgb A1c MFr Bld: 7.3 % — ABNORMAL HIGH (ref 4.8–5.6)

## 2020-10-04 LAB — VITAMIN D 25 HYDROXY (VIT D DEFICIENCY, FRACTURES): Vit D, 25-Hydroxy: 30.9 ng/mL (ref 30.0–100.0)

## 2020-10-06 ENCOUNTER — Encounter: Payer: Self-pay | Admitting: Family Medicine

## 2020-10-06 ENCOUNTER — Other Ambulatory Visit: Payer: Self-pay

## 2020-10-06 ENCOUNTER — Ambulatory Visit (INDEPENDENT_AMBULATORY_CARE_PROVIDER_SITE_OTHER): Payer: PPO | Admitting: Family Medicine

## 2020-10-06 VITALS — BP 130/72 | HR 76 | Resp 16 | Ht 69.0 in | Wt 266.0 lb

## 2020-10-06 DIAGNOSIS — E782 Mixed hyperlipidemia: Secondary | ICD-10-CM

## 2020-10-06 DIAGNOSIS — L732 Hidradenitis suppurativa: Secondary | ICD-10-CM | POA: Diagnosis not present

## 2020-10-06 DIAGNOSIS — E1140 Type 2 diabetes mellitus with diabetic neuropathy, unspecified: Secondary | ICD-10-CM

## 2020-10-06 DIAGNOSIS — E559 Vitamin D deficiency, unspecified: Secondary | ICD-10-CM | POA: Diagnosis not present

## 2020-10-06 DIAGNOSIS — I10 Essential (primary) hypertension: Secondary | ICD-10-CM | POA: Diagnosis not present

## 2020-10-06 MED ORDER — DOXYCYCLINE HYCLATE 100 MG PO TABS: 100.0000 mg | ORAL_TABLET | Freq: Two times a day (BID) | ORAL | 0 refills | Status: DC

## 2020-10-06 NOTE — Progress Notes (Signed)
Theresa Adkins     MRN: [id number]      DOB: 04/28/1945   HPI Theresa Adkins is here for follow up and re-evaluation of chronic medical conditions, medication management and review of any available recent lab and radiology data.  Preventive health is updated, specifically  Cancer screening and Immunization.   Questions or concerns regarding consultations or procedures which the PT has had in the interim are  addressed. The PT denies any adverse reactions to current medications since the last visit.  C/o ski infection with boils under armpits and area on buttock still bleeds after sitting for a long time, no fever, will not let me examine Working on change in diet with excellent weight loss and improved blood sugar Denies polyuria, polydipsia, blurred vision , or hypoglycemic episodes.   ROS Denies recent fever or chills. Denies sinus pressure, nasal congestion, ear pain or sore throat. Denies chest congestion, productive cough or wheezing. Denies chest pains, palpitations and leg swelling Denies abdominal pain, nausea, vomiting,diarrhea or constipation.   Denies dysuria, frequency, hesitancy or incontinence. Denies joint pain, swelling and limitation in mobility. Denies headaches, seizures, numbness, or tingling. Denies depression, anxiety or insomnia.  PE  BP 130/72   Pulse 76   Resp 16   Ht '5\' 9"'$  (1.753 m)   Wt 266 lb (120.7 kg)   SpO2 94%   BMI 39.28 kg/m   Patient alert and oriented and in no cardiopulmonary distress.  HEENT: No facial asymmetry, EOMI,     Neck supple .  Chest: Clear to auscultation bilaterally.  CVS: S1, S2 no murmurs, no S3.Regular rate.  ABD: Soft non tender.   Ext: No edema  MS: Adequate though reduced  ROM spine, shoulders, hips and knees.   Psych: Good eye contact, normal affect. Memory intact not anxious or depressed appearing.  CNS: CN 2-12 intact, power,  normal throughout.no focal deficits noted.   Assessment & Plan  HTN  (hypertension) Controlled, no change in medication DASH diet and commitment to daily physical activity for a minimum of 30 minutes discussed and encouraged, as a part of hypertension management. The importance of attaining a healthy weight is also discussed.  BP/Weight 10/06/2020 10/03/2020 06/15/2020 04/19/2020 03/29/2020 03/22/2020 0000000  Systolic BP AB-123456789 [id number] - 123456 - -  Diastolic BP 72 73 69 - 69 - -  Wt. (Lbs) 266 264 277 270 276.12 275 278  BMI 39.28 38.99 40.91 39.87 40.78 40.61 41.05       HIDRADENITIS curent  Falre reported, doxycycline prescribed  Controlled type 2 diabetes with neuropathy ([place]) Improved, she is applauded on this Theresa Adkins is reminded of the importance of commitment to daily physical activity for 30 minutes or more, as able and the need to limit carbohydrate intake to 30 to 60 grams per meal to help with blood sugar control.   The need to take medication as prescribed, test blood sugar as directed, and to call between visits if there is a concern that blood sugar is uncontrolled is also discussed.   Theresa Adkins is reminded of the importance of daily foot exam, annual eye examination, and good blood sugar, blood pressure and cholesterol control.  Diabetic Labs Latest Ref Rng & Units 10/03/2020 06/09/2020 03/23/2020 03/02/2020 11/08/2019  HbA1c 4.8 - 5.6 % 7.3(H) 7.7(H) - 8.0(H) 7.4(A)  Microalbumin mg/dL - - - - -  Micro/Creat Ratio <30 mcg/mg creat - - - - -  Chol 100 - 199 mg/dL - 189 - 171 -  HDL >39 mg/dL - 49 - 46 -  Calc LDL 0 - 99 mg/dL - 88 - 90 -  Triglycerides 0 - 149 mg/dL - 320(H) - 207(H) -  Creatinine 0.57 - 1.00 mg/dL 0.76 0.59 0.91 0.83 -   BP/Weight 10/06/2020 10/03/2020 06/15/2020 04/19/2020 03/29/2020 03/22/2020 0000000  Systolic BP [id number] [id number] - 123456 - -  Diastolic BP 72 73 69 - 69 - -  Wt. (Lbs) 266 264 277 270 276.12 275 278  BMI 39.28 38.99 40.91 39.87 40.78 40.61 41.05   Foot/eye exam completion dates Latest Ref Rng & Units 08/15/2020 01/20/2020   Eye Exam No Retinopathy No Retinopathy -  Foot exam Order - - -  Foot Form Completion - - Done        Morbid obesity ([facility]) Improved. Pt applauded on succesful weight loss through lifestyle change, and encouraged to continue same. Weight loss goal set for the next several months.   Hyperlipidemia Needs to reduce fat in diet Hyperlipidemia:Low fat diet discussed and encouraged.   Lipid Panel  Lab Results  Component Value Date   CHOL 189 06/09/2020   HDL 49 06/09/2020   LDLCALC 88 06/09/2020   TRIG 320 (H) 06/09/2020   CHOLHDL 3.9 06/09/2020     Updated lab needed at/ before next visit.   Vitamin D deficiency Needs to commit to vit D , 4000 IU daily

## 2020-10-06 NOTE — Assessment & Plan Note (Signed)
Improved. Pt applauded on succesful weight loss through lifestyle change, and encouraged to continue same. Weight loss goal set for the next several months.  

## 2020-10-06 NOTE — Assessment & Plan Note (Signed)
Improved, she is applauded on this Theresa Adkins is reminded of the importance of commitment to daily physical activity for 30 minutes or more, as able and the need to limit carbohydrate intake to 30 to 60 grams per meal to help with blood sugar control.   The need to take medication as prescribed, test blood sugar as directed, and to call between visits if there is a concern that blood sugar is uncontrolled is also discussed.   Theresa Adkins is reminded of the importance of daily foot exam, annual eye examination, and good blood sugar, blood pressure and cholesterol control.  Diabetic Labs Latest Ref Rng & Units 10/03/2020 06/09/2020 03/23/2020 03/02/2020 11/08/2019  HbA1c 4.8 - 5.6 % 7.3(H) 7.7(H) - 8.0(H) 7.4(A)  Microalbumin mg/dL - - - - -  Micro/Creat Ratio <30 mcg/mg creat - - - - -  Chol 100 - 199 mg/dL - 189 - 171 -  HDL >39 mg/dL - 49 - 46 -  Calc LDL 0 - 99 mg/dL - 88 - 90 -  Triglycerides 0 - 149 mg/dL - 320(H) - 207(H) -  Creatinine 0.57 - 1.00 mg/dL 0.76 0.59 0.91 0.83 -   BP/Weight 10/06/2020 10/03/2020 06/15/2020 04/19/2020 03/29/2020 03/22/2020 0000000  Systolic BP [id number] Q000111Q [id number] - 123456 - -  Diastolic BP 72 73 69 - 69 - -  Wt. (Lbs) 266 264 277 270 276.12 275 278  BMI 39.28 38.99 40.91 39.87 40.78 40.61 41.05   Foot/eye exam completion dates Latest Ref Rng & Units 08/15/2020 01/20/2020  Eye Exam No Retinopathy No Retinopathy -  Foot exam Order - - -  Foot Form Completion - - Done

## 2020-10-06 NOTE — Assessment & Plan Note (Signed)
Needs to reduce fat in diet Hyperlipidemia:Low fat diet discussed and encouraged.   Lipid Panel  Lab Results  Component Value Date   CHOL 189 06/09/2020   HDL 49 06/09/2020   LDLCALC 88 06/09/2020   TRIG 320 (H) 06/09/2020   CHOLHDL 3.9 06/09/2020     Updated lab needed at/ before next visit.

## 2020-10-06 NOTE — Assessment & Plan Note (Signed)
Controlled, no change in medication DASH diet and commitment to daily physical activity for a minimum of 30 minutes discussed and encouraged, as a part of hypertension management. The importance of attaining a healthy weight is also discussed.  BP/Weight 10/06/2020 10/03/2020 06/15/2020 04/19/2020 03/29/2020 03/22/2020 0000000  Systolic BP [id number] Q000111Q [id number] - 123456 - -  Diastolic BP 72 73 69 - 69 - -  Wt. (Lbs) 266 264 277 270 276.12 275 278  BMI 39.28 38.99 40.91 39.87 40.78 40.61 41.05

## 2020-10-06 NOTE — Patient Instructions (Addendum)
Annual physical exam in office with MD 12/14 or after call if you need me sooner  CONGRATS on weight loss and improved health, keep it up!  Doxycycline an antibiotic is prescribed for recurrent skin infection  You NEED shingrix vaccines ( 2)   Covid booster #2  Flu vaccine , this will be available her in September  Please take vit D3, 4000 IU every day  Fasting lipid, cmp and EGFR, HBa1C and microalb 5 to 7 days before December appointment  Thanks for choosing [facility], we consider it a privelige to serve you.

## 2020-10-06 NOTE — Assessment & Plan Note (Signed)
curent  Falre reported, doxycycline prescribed

## 2020-10-06 NOTE — Assessment & Plan Note (Signed)
Needs to commit to vit D , 4000 IU daily

## 2020-12-10 ENCOUNTER — Other Ambulatory Visit: Payer: Self-pay | Admitting: Family Medicine

## 2020-12-10 DIAGNOSIS — E785 Hyperlipidemia, unspecified: Secondary | ICD-10-CM

## 2020-12-11 ENCOUNTER — Other Ambulatory Visit: Payer: Self-pay

## 2020-12-11 ENCOUNTER — Ambulatory Visit (INDEPENDENT_AMBULATORY_CARE_PROVIDER_SITE_OTHER): Payer: PPO

## 2020-12-11 ENCOUNTER — Encounter (INDEPENDENT_AMBULATORY_CARE_PROVIDER_SITE_OTHER): Payer: Self-pay

## 2020-12-11 DIAGNOSIS — Z23 Encounter for immunization: Secondary | ICD-10-CM

## 2021-01-14 ENCOUNTER — Other Ambulatory Visit: Payer: Self-pay | Admitting: Family Medicine

## 2021-01-30 DIAGNOSIS — E1140 Type 2 diabetes mellitus with diabetic neuropathy, unspecified: Secondary | ICD-10-CM | POA: Diagnosis not present

## 2021-01-30 DIAGNOSIS — E782 Mixed hyperlipidemia: Secondary | ICD-10-CM | POA: Diagnosis not present

## 2021-01-31 LAB — CMP14+EGFR
ALT: 12 IU/L (ref 0–32)
AST: 15 IU/L (ref 0–40)
Albumin/Globulin Ratio: 1.7 (ref 1.2–2.2)
Albumin: 4.5 g/dL (ref 3.7–4.7)
Alkaline Phosphatase: 72 IU/L (ref 44–121)
BUN/Creatinine Ratio: 10 — ABNORMAL LOW (ref 12–28)
BUN: 7 mg/dL — ABNORMAL LOW (ref 8–27)
Bilirubin Total: 0.4 mg/dL (ref 0.0–1.2)
CO2: 26 mmol/L (ref 20–29)
Calcium: 9.4 mg/dL (ref 8.7–10.3)
Chloride: 100 mmol/L (ref 96–106)
Creatinine, Ser: 0.71 mg/dL (ref 0.57–1.00)
Globulin, Total: 2.7 g/dL (ref 1.5–4.5)
Glucose: 145 mg/dL — ABNORMAL HIGH (ref 70–99)
Potassium: 4.5 mmol/L (ref 3.5–5.2)
Sodium: 138 mmol/L (ref 134–144)
Total Protein: 7.2 g/dL (ref 6.0–8.5)
eGFR: 89 mL/min/{1.73_m2} (ref >59)

## 2021-01-31 LAB — HEMOGLOBIN A1C
Est. average glucose Bld gHb Est-mCnc: 171 mg/dL
Hgb A1c MFr Bld: 7.6 % — ABNORMAL HIGH (ref 4.8–5.6)

## 2021-01-31 LAB — LIPID PANEL
Chol/HDL Ratio: 3.9 ratio (ref 0.0–4.4)
Cholesterol, Total: 182 mg/dL (ref 100–199)
HDL: 47 mg/dL (ref >39)
LDL Chol Calc (NIH): 96 mg/dL (ref 0–99)
Triglycerides: 230 mg/dL — ABNORMAL HIGH (ref 0–149)
VLDL Cholesterol Cal: 39 mg/dL (ref 5–40)

## 2021-01-31 LAB — MICROALBUMIN / CREATININE URINE RATIO

## 2021-02-02 ENCOUNTER — Encounter (INDEPENDENT_AMBULATORY_CARE_PROVIDER_SITE_OTHER): Payer: Self-pay

## 2021-02-02 ENCOUNTER — Ambulatory Visit (INDEPENDENT_AMBULATORY_CARE_PROVIDER_SITE_OTHER): Payer: PPO | Admitting: Family Medicine

## 2021-02-02 ENCOUNTER — Other Ambulatory Visit: Payer: Self-pay

## 2021-02-02 ENCOUNTER — Encounter: Payer: Self-pay | Admitting: Family Medicine

## 2021-02-02 VITALS — BP 117/70 | HR 94 | Resp 17 | Ht 69.0 in | Wt 274.0 lb

## 2021-02-02 DIAGNOSIS — M7989 Other specified soft tissue disorders: Secondary | ICD-10-CM | POA: Diagnosis not present

## 2021-02-02 DIAGNOSIS — E790 Hyperuricemia without signs of inflammatory arthritis and tophaceous disease: Secondary | ICD-10-CM

## 2021-02-02 DIAGNOSIS — Z0000 Encounter for general adult medical examination without abnormal findings: Secondary | ICD-10-CM | POA: Diagnosis not present

## 2021-02-02 DIAGNOSIS — R600 Localized edema: Secondary | ICD-10-CM

## 2021-02-02 DIAGNOSIS — L089 Local infection of the skin and subcutaneous tissue, unspecified: Secondary | ICD-10-CM | POA: Diagnosis not present

## 2021-02-02 DIAGNOSIS — L729 Follicular cyst of the skin and subcutaneous tissue, unspecified: Secondary | ICD-10-CM | POA: Diagnosis not present

## 2021-02-02 DIAGNOSIS — M109 Gout, unspecified: Secondary | ICD-10-CM

## 2021-02-02 DIAGNOSIS — E1140 Type 2 diabetes mellitus with diabetic neuropathy, unspecified: Secondary | ICD-10-CM

## 2021-02-02 MED ORDER — CEPHALEXIN 500 MG PO CAPS: 500.0000 mg | ORAL_CAPSULE | Freq: Two times a day (BID) | ORAL | 0 refills | Status: DC

## 2021-02-02 MED ORDER — FUROSEMIDE 20 MG PO TABS: ORAL_TABLET | ORAL | 3 refills | Status: DC

## 2021-02-02 MED ORDER — POTASSIUM CHLORIDE CRYS ER 20 MEQ PO TBCR: EXTENDED_RELEASE_TABLET | ORAL | 3 refills | Status: DC

## 2021-02-02 NOTE — Progress Notes (Signed)
Compla   Theresa Adkins     MRN: [id number]      DOB: 11/27/1945  HPI: Patient is in for annual physical exam. No other health concerns are expressed or addressed at the visit. Recent labs,  are reviewed. Immunization is reviewed , and  updated if needed.   PE: BP 117/70    Pulse 94    Resp 17    Ht 5\' 9"  (1.753 m)    Wt 274 lb 0.6 oz (124.3 kg)    SpO2 94%    BMI 40.47 kg/m   Pleasant  female, alert and oriented x 3, in no cardio-pulmonary distress. Afebrile. HEENT No facial trauma or asymetry. Sinuses non tender.  Extra occullar muscles intact.. External ears normal, . Neck: supple, no adenopathy,JVD or thyromegaly.No bruits.  Chest: Clear to ascultation bilaterally.No crackles or wheezes. Non tender to palpation  Cardiovascular system; Heart sounds normal,  S1 and  S2 ,no S3.  No murmur, or thrill. Apical beat not displaced Peripheral pulses normal. One plus pitting edema bilatrally  Abdomen: Soft, non tender, no organomegaly or masses. No bruits. Bowel sounds normal. No guarding, tenderness or rebound.    Musculoskeletal exam: Decreased  ROM of spine, hips , shoulders and knees.  deformity ,swelling and crepitus noted. No muscle wasting or atrophy.   Neurologic: Cranial nerves 2 to 12 intact. Power, tone ,sensation and reflexes normal throughout. disturbance in gait. No tremor.  Skin: Infected cyst between buttock folds, tender, not draining , max diameter approx 5cm  Pigmentation normal throughout  Psych; Normal mood and affect. Judgement and concentration normal   Assessment & Plan:  Annual physical exam Annual exam as documented. Counseling done  re healthy lifestyle involving commitment to 150 minutes exercise per week, heart healthy diet, and attaining healthy weight.The importance of adequate sleep also discussed. Regular seat belt use and home safety, is also discussed. Changes in health habits are decided on by the patient with goals and time  frames  set for achieving them. Immunization and cancer screening needs are specifically addressed at this visit.   Infected cyst of skin Keflex prescribed  Edema of leg Increase furosemide dose

## 2021-02-02 NOTE — Patient Instructions (Addendum)
F/u in 3.5 months, call if you need me sooner ° °[place] today ° °New higher dose of furosemide 20 mg TWO daily and potassium 20 meq two dai;y ° °Aspirin dose is 81 mg daily ° °Keflex is prescribe for 1 week for infected cyst ° °hBA1C, chem 7 and EGFr, uric acid 5 days before next visit ° °It is important that you exercise regularly at least 30 minutes 5 times a week. If you develop chest pain, have severe difficulty breathing, or feel very tired, stop exercising immediately and seek medical attention  ° °Think about what you will eat, plan ahead. °Choose " clean, green, fresh or frozen" over canned, processed or packaged foods which are more sugary, salty and fatty. °70 to 75% of food eaten should be vegetables and fruit. °Three meals at set times with snacks allowed between meals, but they must be fruit or vegetables. °Aim to eat over a 12 hour period , example 7 am to 7 pm, and STOP after  your last meal of the day. °Drink water,generally about 64 ounces per day, no other drink is as healthy. Fruit juice is best enjoyed in a healthy way, by EATING the fruit. ° °

## 2021-02-04 LAB — MICROALBUMIN / CREATININE URINE RATIO
Creatinine, Urine: 239.1 mg/dL
Microalb/Creat Ratio: 15 mg/g creat (ref 0–29)
Microalbumin, Urine: 36.9 ug/mL

## 2021-02-05 ENCOUNTER — Encounter: Payer: Self-pay | Admitting: Family Medicine

## 2021-02-05 DIAGNOSIS — R600 Localized edema: Secondary | ICD-10-CM | POA: Insufficient documentation

## 2021-02-05 DIAGNOSIS — L089 Local infection of the skin and subcutaneous tissue, unspecified: Secondary | ICD-10-CM | POA: Insufficient documentation

## 2021-02-05 NOTE — Assessment & Plan Note (Signed)
Keflex prescribed.

## 2021-02-05 NOTE — Assessment & Plan Note (Signed)

## 2021-02-05 NOTE — Assessment & Plan Note (Signed)
Increase furosemide dose

## 2021-03-11 ENCOUNTER — Other Ambulatory Visit: Payer: Self-pay | Admitting: Family Medicine

## 2021-03-11 DIAGNOSIS — E785 Hyperlipidemia, unspecified: Secondary | ICD-10-CM

## 2021-03-29 ENCOUNTER — Other Ambulatory Visit: Payer: Self-pay | Admitting: Family Medicine

## 2021-04-16 ENCOUNTER — Other Ambulatory Visit: Payer: Self-pay | Admitting: Family Medicine

## 2021-04-30 ENCOUNTER — Other Ambulatory Visit (HOSPITAL_COMMUNITY): Payer: Self-pay | Admitting: Neurosurgery

## 2021-04-30 ENCOUNTER — Other Ambulatory Visit: Payer: Self-pay | Admitting: Neurosurgery

## 2021-04-30 DIAGNOSIS — D329 Benign neoplasm of meninges, unspecified: Secondary | ICD-10-CM

## 2021-05-18 ENCOUNTER — Ambulatory Visit: Payer: PPO | Admitting: Family Medicine

## 2021-05-23 ENCOUNTER — Encounter (HOSPITAL_COMMUNITY): Payer: Self-pay

## 2021-05-23 ENCOUNTER — Ambulatory Visit (HOSPITAL_COMMUNITY): Admission: RE | Admit: 2021-05-23 | Payer: PPO | Source: Ambulatory Visit

## 2021-05-25 ENCOUNTER — Other Ambulatory Visit: Payer: Self-pay | Admitting: Family Medicine

## 2021-05-25 DIAGNOSIS — Z1231 Encounter for screening mammogram for malignant neoplasm of breast: Secondary | ICD-10-CM

## 2021-05-31 ENCOUNTER — Ambulatory Visit: Payer: PPO | Admitting: Family Medicine

## 2021-06-09 ENCOUNTER — Other Ambulatory Visit: Payer: Self-pay | Admitting: Family Medicine

## 2021-06-10 ENCOUNTER — Other Ambulatory Visit: Payer: Self-pay | Admitting: Family Medicine

## 2021-06-10 DIAGNOSIS — E785 Hyperlipidemia, unspecified: Secondary | ICD-10-CM

## 2021-06-18 DIAGNOSIS — E790 Hyperuricemia without signs of inflammatory arthritis and tophaceous disease: Secondary | ICD-10-CM | POA: Diagnosis not present

## 2021-06-18 DIAGNOSIS — E782 Mixed hyperlipidemia: Secondary | ICD-10-CM | POA: Diagnosis not present

## 2021-06-18 DIAGNOSIS — E1140 Type 2 diabetes mellitus with diabetic neuropathy, unspecified: Secondary | ICD-10-CM | POA: Diagnosis not present

## 2021-06-19 LAB — BMP8+EGFR
BUN/Creatinine Ratio: 11 — ABNORMAL LOW (ref 12–28)
BUN: 7 mg/dL — ABNORMAL LOW (ref 8–27)
CO2: 25 mmol/L (ref 20–29)
Calcium: 10 mg/dL (ref 8.7–10.3)
Chloride: 102 mmol/L (ref 96–106)
Creatinine, Ser: 0.63 mg/dL (ref 0.57–1.00)
Glucose: 143 mg/dL — ABNORMAL HIGH (ref 70–99)
Potassium: 4.3 mmol/L (ref 3.5–5.2)
Sodium: 143 mmol/L (ref 134–144)
eGFR: 92 mL/min/{1.73_m2} (ref >59)

## 2021-06-19 LAB — HEMOGLOBIN A1C
Est. average glucose Bld gHb Est-mCnc: 154 mg/dL
Hgb A1c MFr Bld: 7 % — ABNORMAL HIGH (ref 4.8–5.6)

## 2021-06-19 LAB — URIC ACID: Uric Acid: 5.8 mg/dL (ref 3.1–7.9)

## 2021-06-20 ENCOUNTER — Encounter: Payer: Self-pay | Admitting: Family Medicine

## 2021-06-20 ENCOUNTER — Ambulatory Visit (INDEPENDENT_AMBULATORY_CARE_PROVIDER_SITE_OTHER): Payer: PPO | Admitting: Family Medicine

## 2021-06-20 VITALS — BP 130/65 | HR 72 | Ht 69.0 in | Wt 262.1 lb

## 2021-06-20 DIAGNOSIS — E1140 Type 2 diabetes mellitus with diabetic neuropathy, unspecified: Secondary | ICD-10-CM

## 2021-06-20 DIAGNOSIS — I10 Essential (primary) hypertension: Secondary | ICD-10-CM | POA: Diagnosis not present

## 2021-06-20 DIAGNOSIS — E782 Mixed hyperlipidemia: Secondary | ICD-10-CM

## 2021-06-20 NOTE — Assessment & Plan Note (Signed)
DASH diet and commitment to daily physical activity for a minimum of 30 minutes discussed and encouraged, as a part of hypertension management. ?The importance of attaining a healthy weight is also discussed. ? ? ?  06/20/2021  ?  8:51 AM 02/02/2021  ?  9:02 AM 10/06/2020  ?  8:43 AM 10/03/2020  ?  9:19 AM 06/15/2020  ? 10:09 AM 04/19/2020  ?  1:33 PM 03/29/2020  ?  2:05 PM  ?BP/Weight  ?Systolic BP 626 948 546 270 130  144  ?Diastolic BP 65 70 72 73 69  69  ?Wt. (Lbs) 262.08 274.04 266 264 277 270 276.12  ?BMI 38.7 kg/m2 40.47 kg/m2 39.28 kg/m2 38.99 kg/m2 40.91 kg/m2 39.87 kg/m2 40.78 kg/m2  ? ? ? ?Controlled, no change in medication ? ?

## 2021-06-20 NOTE — Assessment & Plan Note (Signed)
Hyperlipidemia:Low fat diet discussed and encouraged. ? ? ?Lipid Panel  ?Lab Results  ?Component Value Date  ? CHOL 182 01/30/2021  ? HDL 47 01/30/2021  ? [place] 96 01/30/2021  ? TRIG 230 (H) 01/30/2021  ? CHOLHDL 3.9 01/30/2021  ? ? ? ?Updated lab needed at/ before next visit. ? ?

## 2021-06-20 NOTE — Progress Notes (Signed)
? ?Theresa Adkins     MRN: [id number]      DOB: 1945-12-30 ? ? ?HPI ?Theresa Adkins is here for follow up and re-evaluation of chronic medical conditions, medication management and review of any available recent lab and radiology data.  ?Preventive health is updated, specifically  Cancer screening and Immunization.   ?Questions or concerns regarding consultations or procedures which the PT has had in the interim are  addressed. ?The PT denies any adverse reactions to current medications since the last visit.  ?There are no new concerns.  ?C/o sore on buttock , worse with using riding mower, and sitting for long periods, does not want surgery eval now, denies purulent drainage or fever ?\Has worked successfully on weight losss and improved blood sugar, which is great ?Requests diabetic shoes ?ROS ?Denies recent fever or chills. ?Denies sinus pressure, nasal congestion, ear pain or sore throat. ?Denies chest congestion, productive cough or wheezing. ?Denies chest pains, palpitations and leg swelling ?Denies abdominal pain, nausea, vomiting,diarrhea or constipation.   ?Denies dysuria, frequency, hesitancy or incontinence. ?Denies joint pain, swelling and limitation in mobility. ?Denies headaches, seizures, numbness, or tingling. ?Denies depression, anxiety or insomnia. ?PE ? ?BP 130/65   Pulse 72   Ht '5\' 9"'$  (1.753 m)   Wt 262 lb 1.3 oz (118.9 kg)   SpO2 93%   BMI 38.70 kg/m?  ? ?Patient alert and oriented and in no cardiopulmonary distress. ? ?HEENT: No facial asymmetry, EOMI,     Neck supple . ? ?Chest: Clear to auscultation bilaterally. ? ?CVS: S1, S2 no murmurs, no S3.Regular rate. ? ?ABD: Soft non tender.  ? ?Ext: No edema ? ?MS: Adequate ROM spine, shoulders, hips and knees.ed. ? ?Psych: Good eye contact, normal affect. Memory intact not anxious or depressed appearing. ? ?CNS: CN 2-12 intact, power,  normal throughout.no focal deficits noted. ? ? ?Assessment & Plan ? ?HTN (hypertension) ?DASH diet and commitment to  daily physical activity for a minimum of 30 minutes discussed and encouraged, as a part of hypertension management. ?The importance of attaining a healthy weight is also discussed. ? ? ?  06/20/2021  ?  8:51 AM 02/02/2021  ?  9:02 AM 10/06/2020  ?  8:43 AM 10/03/2020  ?  9:19 AM 06/15/2020  ? 10:09 AM 04/19/2020  ?  1:33 PM 03/29/2020  ?  2:05 PM  ?BP/Weight  ?Systolic BP 938 182 993 716 130  144  ?Diastolic BP 65 70 72 73 69  69  ?Wt. (Lbs) 262.08 274.04 266 264 277 270 276.12  ?BMI 38.7 kg/m2 40.47 kg/m2 39.28 kg/m2 38.99 kg/m2 40.91 kg/m2 39.87 kg/m2 40.78 kg/m2  ? ? ? ?Controlled, no change in medication ? ? ?Controlled type 2 diabetes with neuropathy ([place]) ?Improved, no med change ?Theresa Adkins is reminded of the importance of commitment to daily physical activity for 30 minutes or more, as able and the need to limit carbohydrate intake to 30 to 60 grams per meal to help with blood sugar control.  ? ?The need to take medication as prescribed, test blood sugar as directed, and to call between visits if there is a concern that blood sugar is uncontrolled is also discussed.  ? ?Theresa Adkins is reminded of the importance of daily foot exam, annual eye examination, and good blood sugar, blood pressure and cholesterol control. ?Has neuropathy and ankle deformity qualifies for diabetic shoes ? ? ?  Latest Ref Rng & Units 06/18/2021  ?  9:56 AM 02/02/2021  ?  11:20 AM 01/30/2021  ? 11:21 AM 10/03/2020  ?  9:11 AM 06/09/2020  ?  8:46 AM  ?Diabetic Labs  ?HbA1c 4.8 - 5.6 % 7.0    7.6   7.3   7.7    ?Micro/Creat Ratio 0 - 29 mg/g creat  15       ?Chol 100 - 199 mg/dL   182    189    ?HDL >39 mg/dL   47    49    ?Calc LDL 0 - 99 mg/dL   96    88    ?Triglycerides 0 - 149 mg/dL   230    320    ?Creatinine 0.57 - 1.00 mg/dL 0.63    0.71   0.76   0.59    ? ? ?  06/20/2021  ?  8:51 AM 02/02/2021  ?  9:02 AM 10/06/2020  ?  8:43 AM 10/03/2020  ?  9:19 AM 06/15/2020  ? 10:09 AM 04/19/2020  ?  1:33 PM 03/29/2020  ?  2:05 PM  ?BP/Weight  ?Systolic BP 270 786 754  492 130  144  ?Diastolic BP 65 70 72 73 69  69  ?Wt. (Lbs) 262.08 274.04 266 264 277 270 276.12  ?BMI 38.7 kg/m2 40.47 kg/m2 39.28 kg/m2 38.99 kg/m2 40.91 kg/m2 39.87 kg/m2 40.78 kg/m2  ? ? ?  06/20/2021  ?  8:40 AM 02/02/2021  ?  9:00 AM  ?Foot/eye exam completion dates  ?Foot Form Completion Done Done  ? ? ? ? ? ? ?Hyperlipidemia ?Hyperlipidemia:Low fat diet discussed and encouraged. ? ? ?Lipid Panel  ?Lab Results  ?Component Value Date  ? CHOL 182 01/30/2021  ? HDL 47 01/30/2021  ? [place] 96 01/30/2021  ? TRIG 230 (H) 01/30/2021  ? CHOLHDL 3.9 01/30/2021  ? ? ? ?Updated lab needed at/ before next visit. ? ? ?Morbid obesity ([place]) ?[id number] ? ?Patient re-educated about  the importance of commitment to a  minimum of 150 minutes of exercise per week as able. ? ?The importance of healthy food choices with portion control discussed, as well as eating regularly and within a 12 hour window most days. ?The need to choose "clean , green" food 50 to 75% of the time is discussed, as well as to make water the primary drink and set a goal of 64 ounces water daily. ? ?  ? ?  06/20/2021  ?  8:51 AM 02/02/2021  ?  9:02 AM 10/06/2020  ?  8:43 AM  ?Weight /BMI  ?Weight 262 lb 1.3 oz 274 lb 0.6 oz 266 lb  ?Height '5\' 9"'$  (1.753 m) '5\' 9"'$  (1.753 m) '5\' 9"'$  (1.753 m)  ?BMI 38.7 kg/m2 40.47 kg/m2 39.28 kg/m2  ? ? ? ? ?

## 2021-06-20 NOTE — Patient Instructions (Addendum)
F/U in 4 months, call if you need me sooner ? ?Please add hepatic panel and lipid panel to lab ? ?Exam qualifies you for shoes ? ? ?Please get shingrix vaccines at your pharmacy ? ?Thanks for choosing [facility], we consider it a privelige to serve you. ? ?Labs to be drawn 5 days before next visitr will be ordered after review of added on labs ? ?

## 2021-06-20 NOTE — Assessment & Plan Note (Signed)
[id number] ? ?Patient re-educated about  the importance of commitment to a  minimum of 150 minutes of exercise per week as able. ? ?The importance of healthy food choices with portion control discussed, as well as eating regularly and within a 12 hour window most days. ?The need to choose "clean , green" food 50 to 75% of the time is discussed, as well as to make water the primary drink and set a goal of 64 ounces water daily. ? ?  ? ?  06/20/2021  ?  8:51 AM 02/02/2021  ?  9:02 AM 10/06/2020  ?  8:43 AM  ?Weight /BMI  ?Weight 262 lb 1.3 oz 274 lb 0.6 oz 266 lb  ?Height '5\' 9"'$  (1.753 m) '5\' 9"'$  (1.753 m) '5\' 9"'$  (1.753 m)  ?BMI 38.7 kg/m2 40.47 kg/m2 39.28 kg/m2  ? ? ? ?

## 2021-06-20 NOTE — Assessment & Plan Note (Addendum)
Improved, no med change ?Theresa Adkins is reminded of the importance of commitment to daily physical activity for 30 minutes or more, as able and the need to limit carbohydrate intake to 30 to 60 grams per meal to help with blood sugar control.  ? ?The need to take medication as prescribed, test blood sugar as directed, and to call between visits if there is a concern that blood sugar is uncontrolled is also discussed.  ? ?Theresa Adkins is reminded of the importance of daily foot exam, annual eye examination, and good blood sugar, blood pressure and cholesterol control. ?Has neuropathy and ankle deformity qualifies for diabetic shoes ? ? ?  Latest Ref Rng & Units 06/18/2021  ?  9:56 AM 02/02/2021  ? 11:20 AM 01/30/2021  ? 11:21 AM 10/03/2020  ?  9:11 AM 06/09/2020  ?  8:46 AM  ?Diabetic Labs  ?HbA1c 4.8 - 5.6 % 7.0    7.6   7.3   7.7    ?Micro/Creat Ratio 0 - 29 mg/g creat  15       ?Chol 100 - 199 mg/dL   182    189    ?HDL >39 mg/dL   47    49    ?Calc LDL 0 - 99 mg/dL   96    88    ?Triglycerides 0 - 149 mg/dL   230    320    ?Creatinine 0.57 - 1.00 mg/dL 0.63    0.71   0.76   0.59    ? ? ?  06/20/2021  ?  8:51 AM 02/02/2021  ?  9:02 AM 10/06/2020  ?  8:43 AM 10/03/2020  ?  9:19 AM 06/15/2020  ? 10:09 AM 04/19/2020  ?  1:33 PM 03/29/2020  ?  2:05 PM  ?BP/Weight  ?Systolic BP 062 376 283 151 130  144  ?Diastolic BP 65 70 72 73 69  69  ?Wt. (Lbs) 262.08 274.04 266 264 277 270 276.12  ?BMI 38.7 kg/m2 40.47 kg/m2 39.28 kg/m2 38.99 kg/m2 40.91 kg/m2 39.87 kg/m2 40.78 kg/m2  ? ? ?  06/20/2021  ?  8:40 AM 02/02/2021  ?  9:00 AM  ?Foot/eye exam completion dates  ?Foot Form Completion Done Done  ? ? ? ? ? ?

## 2021-06-21 LAB — LIPID PANEL
Chol/HDL Ratio: 3.6 ratio (ref 0.0–4.4)
Cholesterol, Total: 176 mg/dL (ref 100–199)
HDL: 49 mg/dL (ref >39)
LDL Chol Calc (NIH): 89 mg/dL (ref 0–99)
Triglycerides: 230 mg/dL — ABNORMAL HIGH (ref 0–149)
VLDL Cholesterol Cal: 38 mg/dL (ref 5–40)

## 2021-06-21 LAB — HEPATIC FUNCTION PANEL
ALT: 12 IU/L (ref 0–32)
AST: 18 IU/L (ref 0–40)
Albumin: 4.5 g/dL (ref 3.7–4.7)
Alkaline Phosphatase: 62 IU/L (ref 44–121)
Bilirubin Total: 0.4 mg/dL (ref 0.0–1.2)
Bilirubin, Direct: 0.1 mg/dL (ref 0.00–0.40)
Total Protein: 7.6 g/dL (ref 6.0–8.5)

## 2021-06-21 LAB — SPECIMEN STATUS REPORT

## 2021-06-28 ENCOUNTER — Other Ambulatory Visit: Payer: Self-pay | Admitting: Family Medicine

## 2021-07-02 ENCOUNTER — Telehealth: Payer: Self-pay | Admitting: Family Medicine

## 2021-07-02 ENCOUNTER — Other Ambulatory Visit: Payer: Self-pay

## 2021-07-02 MED ORDER — METFORMIN HCL 1000 MG PO TABS: ORAL_TABLET | ORAL | 0 refills | Status: DC

## 2021-07-02 NOTE — Telephone Encounter (Signed)
Refills sent

## 2021-07-02 NOTE — Telephone Encounter (Signed)
Pt called stating that pharmacy has not got a response to refill the metformin. Can you please look into this? And call pt? ? ? ?[facility]  ?

## 2021-07-12 ENCOUNTER — Ambulatory Visit
Admission: RE | Admit: 2021-07-12 | Discharge: 2021-07-12 | Disposition: A | Discharge status: Home / Self Care | Payer: PPO | Source: Ambulatory Visit | Attending: Family Medicine | Admitting: Family Medicine

## 2021-07-12 DIAGNOSIS — Z1231 Encounter for screening mammogram for malignant neoplasm of breast: Secondary | ICD-10-CM | POA: Diagnosis not present

## 2021-07-22 ENCOUNTER — Other Ambulatory Visit: Payer: Self-pay | Admitting: Family Medicine

## 2021-07-23 ENCOUNTER — Telehealth: Payer: Self-pay

## 2021-07-23 NOTE — Telephone Encounter (Signed)
Patient called need med refill today  bisoprolol-hydrochlorothiazide (ZIAC) 10-6.25 MG tablet  Pharmacy: [facility]

## 2021-08-16 DIAGNOSIS — H524 Presbyopia: Secondary | ICD-10-CM | POA: Diagnosis not present

## 2021-08-16 DIAGNOSIS — E1136 Type 2 diabetes mellitus with diabetic cataract: Secondary | ICD-10-CM | POA: Diagnosis not present

## 2021-08-16 DIAGNOSIS — H5213 Myopia, bilateral: Secondary | ICD-10-CM | POA: Diagnosis not present

## 2021-08-16 DIAGNOSIS — H52203 Unspecified astigmatism, bilateral: Secondary | ICD-10-CM | POA: Diagnosis not present

## 2021-08-16 DIAGNOSIS — H25813 Combined forms of age-related cataract, bilateral: Secondary | ICD-10-CM | POA: Diagnosis not present

## 2021-09-09 ENCOUNTER — Other Ambulatory Visit: Payer: Self-pay | Admitting: Family Medicine

## 2021-09-09 DIAGNOSIS — E785 Hyperlipidemia, unspecified: Secondary | ICD-10-CM

## 2021-09-28 ENCOUNTER — Other Ambulatory Visit: Payer: Self-pay | Admitting: Family Medicine

## 2021-10-18 ENCOUNTER — Other Ambulatory Visit: Payer: Self-pay | Admitting: Family Medicine

## 2021-10-23 DIAGNOSIS — E559 Vitamin D deficiency, unspecified: Secondary | ICD-10-CM | POA: Diagnosis not present

## 2021-10-23 DIAGNOSIS — I10 Essential (primary) hypertension: Secondary | ICD-10-CM | POA: Diagnosis not present

## 2021-10-23 DIAGNOSIS — M109 Gout, unspecified: Secondary | ICD-10-CM | POA: Diagnosis not present

## 2021-10-23 DIAGNOSIS — E782 Mixed hyperlipidemia: Secondary | ICD-10-CM | POA: Diagnosis not present

## 2021-10-24 LAB — LIPID PANEL
Chol/HDL Ratio: 3.3 ratio (ref 0.0–4.4)
Cholesterol, Total: 164 mg/dL (ref 100–199)
HDL: 50 mg/dL (ref >39)
LDL Chol Calc (NIH): 75 mg/dL (ref 0–99)
Triglycerides: 237 mg/dL — ABNORMAL HIGH (ref 0–149)
VLDL Cholesterol Cal: 39 mg/dL (ref 5–40)

## 2021-10-24 LAB — HEPATIC FUNCTION PANEL
ALT: 13 IU/L (ref 0–32)
AST: 12 IU/L (ref 0–40)
Albumin: 4.5 g/dL (ref 3.8–4.8)
Alkaline Phosphatase: 71 IU/L (ref 44–121)
Bilirubin Total: 0.3 mg/dL (ref 0.0–1.2)
Bilirubin, Direct: <0.1 mg/dL (ref 0.00–0.40)
Total Protein: 7.4 g/dL (ref 6.0–8.5)

## 2021-10-26 ENCOUNTER — Encounter: Payer: Self-pay | Admitting: Family Medicine

## 2021-10-26 ENCOUNTER — Ambulatory Visit (INDEPENDENT_AMBULATORY_CARE_PROVIDER_SITE_OTHER): Payer: PPO | Admitting: Family Medicine

## 2021-10-26 VITALS — BP 150/80 | HR 86 | Resp 18 | Ht 69.0 in | Wt 272.1 lb

## 2021-10-26 DIAGNOSIS — A630 Anogenital (venereal) warts: Secondary | ICD-10-CM | POA: Diagnosis not present

## 2021-10-26 DIAGNOSIS — E782 Mixed hyperlipidemia: Secondary | ICD-10-CM | POA: Diagnosis not present

## 2021-10-26 DIAGNOSIS — E559 Vitamin D deficiency, unspecified: Secondary | ICD-10-CM | POA: Diagnosis not present

## 2021-10-26 DIAGNOSIS — E1140 Type 2 diabetes mellitus with diabetic neuropathy, unspecified: Secondary | ICD-10-CM | POA: Diagnosis not present

## 2021-10-26 DIAGNOSIS — I10 Essential (primary) hypertension: Secondary | ICD-10-CM | POA: Diagnosis not present

## 2021-10-26 DIAGNOSIS — R2242 Localized swelling, mass and lump, left lower limb: Secondary | ICD-10-CM

## 2021-10-26 DIAGNOSIS — M109 Gout, unspecified: Secondary | ICD-10-CM | POA: Diagnosis not present

## 2021-10-26 LAB — POCT GLYCOSYLATED HEMOGLOBIN (HGB A1C)
HbA1c POC (<> result, manual entry): 6.9 % (ref 4.0–5.6)
HbA1c, POC (controlled diabetic range): 6.9 % (ref 0.0–7.0)

## 2021-10-26 MED ORDER — FENOFIBRATE 48 MG PO TABS: 48.0000 mg | ORAL_TABLET | Freq: Every day | ORAL | 5 refills | Status: DC

## 2021-10-26 NOTE — Patient Instructions (Addendum)
F/u in 2 months, re evaluate blood pressure and weight  You are referred to Gen surgery , Dr Constance Haw, re ulcers  and warts  Fenofibrate is added to medication to help ;lower triglycerides  Please change food choice     NURSE please add to recent labs chem 7 and EGFr, uric acid, TSH, vit D  GlycoHb today in office  Nurse please obtain shingrix #2 from [place] and document

## 2021-10-26 NOTE — Progress Notes (Unsigned)
Theresa Adkins     MRN: [id number]      DOB: [birth year]   HPI Theresa Adkins is here for follow up and re-evaluation of chronic medical conditions, medication management and review of any available recent lab and radiology data.  Preventive health is updated, specifically  Cancer screening and Immunization.  Delaying flu vaccine Increased pain and bloody drainage from buttock ulcers x `1 year, no documented fever or chills, wants consultation to address  ROS Denies recent fever or chills. Denies sinus pressure, nasal congestion, ear pain or sore throat. Denies chest congestion, productive cough or wheezing. Denies chest pains, palpitations and leg swelling Denies abdominal pain, nausea, vomiting,diarrhea or constipation.   Denies dysuria, frequency, hesitancy or incontinence. Chronic  joint pain, swelling and limitation in mobility. Denies headaches, seizures, numbness, or tingling. Denies depression, anxiety or insomnia.  PE  BP (!) 142/68 (BP Location: Left Arm, Cuff Size: Large)   Pulse 86   Resp 18   Ht '5\' 9"'$  (1.753 m)   Wt 272 lb 1.3 oz (123.4 kg)   SpO2 92%   BMI 40.18 kg/m   Patient alert and oriented and in no cardiopulmonary distress.Pt in pain  HEENT: No facial asymmetry, EOMI,     Neck supple .  Chest: Clear to auscultation bilaterally.  CVS: S1, S2 no murmurs, no S3.Regular rate.  ABD: Soft non tender.   Ext: No edema  MS: decreased  ROM spine, shoulders, hips and knees.  Skin: anogenital warts and ulcer.  Psych: Good eye contact, normal affect. Memory intact not anxious or depressed appearing.  CNS: CN 2-12 intact, power,  normal throughout.no focal deficits noted.   Assessment & Plan  Controlled type 2 diabetes with neuropathy [facility]) Ms. Dubose is reminded of the importance of commitment to daily physical activity for 30 minutes or more, as able and the need to limit carbohydrate intake to 30 to 60 grams per meal to help with blood sugar control.   The  need to take medication as prescribed, test blood sugar as directed, and to call between visits if there is a concern that blood sugar is uncontrolled is also discussed.   Ms. Stegmann is reminded of the importance of daily foot exam, annual eye examination, and good blood sugar, blood pressure and cholesterol control. Improved and controlled     Latest Ref Rng & Units 10/26/2021    9:25 AM 10/23/2021   11:11 AM 06/18/2021    9:56 AM 02/02/2021   11:20 AM 01/30/2021   11:21 AM  Diabetic Labs  HbA1c 0.0 - 7.0 % 4.0 - 5.6 % 6.9    6.9   7.0   7.6   Micro/Creat Ratio 0 - 29 mg/g creat    15    Chol 100 - 199 mg/dL  164  176   182   HDL >39 mg/dL  50  49   47   Calc LDL 0 - 99 mg/dL  75  89   96   Triglycerides 0 - 149 mg/dL  237  230   230   Creatinine 0.57 - 1.00 mg/dL  0.63  0.63   0.71       10/26/2021    9:35 AM 10/26/2021    8:40 AM 10/26/2021    8:30 AM 06/20/2021    8:51 AM 02/02/2021    9:02 AM 10/06/2020    8:43 AM 10/03/2020    9:19 AM  BP/Weight  Systolic BP 333 545 625 638 117  101 751  Diastolic BP 80 68 78 65 70 72 73  Wt. (Lbs)   272.08 262.08 274.04 266 264  BMI   40.18 kg/m2 38.7 kg/m2 40.47 kg/m2 39.28 kg/m2 38.99 kg/m2      06/20/2021    8:40 AM 02/02/2021    9:00 AM  Foot/eye exam completion dates  Foot Form Completion Done Done        Hyperlipidemia Hyperlipidemia:Low fat diet discussed and encouraged.   Lipid Panel  Lab Results  Component Value Date   CHOL 164 10/23/2021   HDL 50 10/23/2021   LDLCALC 75 10/23/2021   TRIG 237 (H) 10/23/2021   CHOLHDL 3.3 10/23/2021     Add fenofibrate and lower fat intake  Morbid obesity ([facility])  Patient re-educated about  the importance of commitment to a  minimum of 150 minutes of exercise per week as able.  The importance of healthy food choices with portion control discussed, as well as eating regularly and within a 12 hour window most days. The need to choose "clean , green" food 50 to 75% of the time is  discussed, as well as to make water the primary drink and set a goal of 64 ounces water daily.       10/26/2021    8:30 AM 06/20/2021    8:51 AM 02/02/2021    9:02 AM  Weight /BMI  Weight 272 lb 1.3 oz 262 lb 1.3 oz 274 lb 0.6 oz  Height '5\' 9"'$  (1.753 m) '5\' 9"'$  (1.753 m) '5\' 9"'$  (1.753 m)  BMI 40.18 kg/m2 38.7 kg/m2 40.47 kg/m2      Anogenital warts Refer gen surgeery  Gout No recent flare, not taking allopurinol regularly, check uric acid level if good , no need to resume allopurinol

## 2021-10-27 ENCOUNTER — Encounter: Payer: Self-pay | Admitting: Family Medicine

## 2021-10-27 NOTE — Assessment & Plan Note (Signed)
Hyperlipidemia:Low fat diet discussed and encouraged.   Lipid Panel  Lab Results  Component Value Date   CHOL 164 10/23/2021   HDL 50 10/23/2021   LDLCALC 75 10/23/2021   TRIG 237 (H) 10/23/2021   CHOLHDL 3.3 10/23/2021     Add fenofibrate and lower fat intake

## 2021-10-27 NOTE — Assessment & Plan Note (Signed)
No recent flare, not taking allopurinol regularly, check uric acid level if good , no need to resume allopurinol

## 2021-10-27 NOTE — Assessment & Plan Note (Signed)
Refer gen surgeery

## 2021-10-27 NOTE — Assessment & Plan Note (Signed)
Ms. Steinkamp is reminded of the importance of commitment to daily physical activity for 30 minutes or more, as able and the need to limit carbohydrate intake to 30 to 60 grams per meal to help with blood sugar control.   The need to take medication as prescribed, test blood sugar as directed, and to call between visits if there is a concern that blood sugar is uncontrolled is also discussed.   Ms. Bastien is reminded of the importance of daily foot exam, annual eye examination, and good blood sugar, blood pressure and cholesterol control. Improved and controlled     Latest Ref Rng & Units 10/26/2021    9:25 AM 10/23/2021   11:11 AM 06/18/2021    9:56 AM 02/02/2021   11:20 AM 01/30/2021   11:21 AM  Diabetic Labs  HbA1c 0.0 - 7.0 % 4.0 - 5.6 % 6.9    6.9   7.0   7.6   Micro/Creat Ratio 0 - 29 mg/g creat    15    Chol 100 - 199 mg/dL  164  176   182   HDL >39 mg/dL  50  49   47   Calc LDL 0 - 99 mg/dL  75  89   96   Triglycerides 0 - 149 mg/dL  237  230   230   Creatinine 0.57 - 1.00 mg/dL  0.63  0.63   0.71       10/26/2021    9:35 AM 10/26/2021    8:40 AM 10/26/2021    8:30 AM 06/20/2021    8:51 AM 02/02/2021    9:02 AM 10/06/2020    8:43 AM 10/03/2020    9:19 AM  BP/Weight  Systolic BP 621 308 657 846 962 952 841  Diastolic BP 80 68 78 65 70 72 73  Wt. (Lbs)   272.08 262.08 274.04 266 264  BMI   40.18 kg/m2 38.7 kg/m2 40.47 kg/m2 39.28 kg/m2 38.99 kg/m2      06/20/2021    8:40 AM 02/02/2021    9:00 AM  Foot/eye exam completion dates  Foot Form Completion Done Done

## 2021-10-27 NOTE — Assessment & Plan Note (Signed)
  Patient re-educated about  the importance of commitment to a  minimum of 150 minutes of exercise per week as able.  The importance of healthy food choices with portion control discussed, as well as eating regularly and within a 12 hour window most days. The need to choose "clean , green" food 50 to 75% of the time is discussed, as well as to make water the primary drink and set a goal of 64 ounces water daily.       10/26/2021    8:30 AM 06/20/2021    8:51 AM 02/02/2021    9:02 AM  Weight /BMI  Weight 272 lb 1.3 oz 262 lb 1.3 oz 274 lb 0.6 oz  Height '5\' 9"'$  (1.753 m) '5\' 9"'$  (1.753 m) '5\' 9"'$  (1.753 m)  BMI 40.18 kg/m2 38.7 kg/m2 40.47 kg/m2

## 2021-10-30 LAB — BASIC METABOLIC PANEL WITH GFR
BUN/Creatinine Ratio: 8 — ABNORMAL LOW (ref 12–28)
BUN: 5 mg/dL — ABNORMAL LOW (ref 8–27)
CO2: 19 mmol/L — ABNORMAL LOW (ref 20–29)
Calcium: 9.4 mg/dL (ref 8.7–10.3)
Chloride: 99 mmol/L (ref 96–106)
Creatinine, Ser: 0.63 mg/dL (ref 0.57–1.00)
Glucose: 142 mg/dL — ABNORMAL HIGH (ref 70–99)
Potassium: 4.3 mmol/L (ref 3.5–5.2)
Sodium: 141 mmol/L (ref 134–144)
eGFR: 92 mL/min/{1.73_m2}

## 2021-10-30 LAB — URIC ACID: Uric Acid: 5.4 mg/dL (ref 3.1–7.9)

## 2021-10-30 LAB — SPECIMEN STATUS REPORT

## 2021-10-30 LAB — TSH: TSH: 1.69 u[IU]/mL (ref 0.450–4.500)

## 2021-10-30 LAB — VITAMIN D 25 HYDROXY (VIT D DEFICIENCY, FRACTURES): Vit D, 25-Hydroxy: 30.8 ng/mL (ref 30.0–100.0)

## 2021-11-08 ENCOUNTER — Ambulatory Visit (INDEPENDENT_AMBULATORY_CARE_PROVIDER_SITE_OTHER): Payer: PPO

## 2021-11-08 DIAGNOSIS — Z0000 Encounter for general adult medical examination without abnormal findings: Secondary | ICD-10-CM

## 2021-11-08 NOTE — Patient Instructions (Signed)
  Theresa Adkins , Thank you for taking time to come for your Medicare Wellness Visit. I appreciate your ongoing commitment to your health goals. Please review the following plan we discussed and let me know if I can assist you in the future.   These are the goals we discussed:  Goals      Exercise 3x per week (30 min per time)     Recommend starting a routine exercise program at least 3 days a week for 30-45 minutes at a time as tolerated.       Patient Stated     Not at this time.     Social and Functional Skills Optimized     Evidence-based guidance:  Assess level of social support; promote maintaining links with family, friends and community to reduce social isolation.  Assess level of function related to basic activities of daily living that include eating, dressing, bathing, as well as instrumental activities of daily living such as shopping, managing finances and use of devices.  Encourage continuation of daily life components such as self-care, home maintenance, financial management, volunteer activities, education opportunities and hobbies.  Refer to occupational or physical therapy to develop comprehensive rehabilitation plan to improve or maintain activities of daily living; consider inclusion of endurance, balance and resistance-training.  Consider complementary therapy such as yoga, music, gardening, outdoor activities, aromatherapy and tai chi.   Notes: "I would like to get to [place]"         This is a list of the screening recommended for you and due dates:  Health Maintenance  Topic Date Due   COVID-19 Vaccine (4 - Pfizer risk series) 03/20/2020   Eye exam for diabetics  08/15/2021   Zoster (Shingles) Vaccine (2 of 2) 08/17/2021   Flu Shot  02/06/2022*   Yearly kidney health urinalysis for diabetes  02/02/2022   Hemoglobin A1C  04/26/2022   Complete foot exam   06/21/2022   Yearly kidney function blood test for diabetes  10/24/2022   Tetanus Vaccine  09/11/2030    Pneumonia Vaccine  Completed   DEXA scan (bone density measurement)  Completed   Hepatitis C Screening: USPSTF Recommendation to screen - Ages 57-79 yo.  Completed   HPV Vaccine  Aged Out   Colon Cancer Screening  Discontinued  *Topic was postponed. The date shown is not the original due date.

## 2021-11-08 NOTE — Progress Notes (Signed)
Subjective:   COLLETTE PESCADOR is a 76 y.o. female who presents for Medicare Annual (Subsequent) preventive examination.  Review of Systems    I connected with  JANELLA ROGALA on 11/08/21 by a audio enabled telemedicine application and verified that I am speaking with the correct person using two identifiers.  Patient Location: Home  Provider Location: Office/Clinic  I discussed the limitations of evaluation and management by telemedicine. The patient expressed understanding and agreed to proceed.        Objective:    There were no vitals filed for this visit. There is no height or weight on file to calculate BMI.     10/03/2020    9:29 AM 09/28/2019    8:50 AM 11/27/2018    7:43 AM 09/22/2017    9:31 AM 06/24/2016    9:32 AM 05/14/2016    6:24 AM 08/09/2013    8:25 AM  Advanced Directives  Does Patient Have a Medical Advance Directive? No No No No No Yes Patient does not have advance directive;Patient would not like information  Type of Advance Directive      Living will;Healthcare Power of Attorney   Does patient want to make changes to medical advance directive?      No - Patient declined   Copy of [place] in Chart?      No - copy requested   Would patient like information on creating a medical advance directive? No - Patient declined No - Patient declined No - Patient declined Yes (ED - Information included in AVS) No - Patient declined      Current Medications (verified) Outpatient Encounter Medications as of 11/08/2021  Medication Sig   amLODipine (NORVASC) 10 MG tablet Take 1 tablet by mouth once daily   aspirin 325 MG EC tablet Take 325 mg by mouth daily. Pt takes 1 daily   atorvastatin (LIPITOR) 10 MG tablet Take 1 tablet by mouth once daily   bisoprolol-hydrochlorothiazide (ZIAC) 10-6.25 MG tablet Take 1 tablet by mouth once daily   Cholecalciferol (VITAMIN D3) 50 MCG (2000 UT) CHEW Chew 4,000 Units by mouth daily.    fenofibrate (TRICOR) 48 MG  tablet Take 1 tablet (48 mg total) by mouth daily.   ferrous sulfate 325 (65 FE) MG tablet Take 325 mg by mouth daily with breakfast.   gabapentin (NEURONTIN) 100 MG capsule Take one capsule by mouth at bedtime as needed, for arm pain   glucose blood (RELION PREMIER TEST) test strip Use as instructed once daily dx e11.9   metFORMIN (GLUCOPHAGE) 1000 MG tablet TAKE 1 TABLET BY MOUTH ONCE DAILY WITH A MEAL   Multiple Vitamin (MULTIVITAMIN) tablet Take 1 tablet by mouth daily.   UNABLE TO FIND Diabetic shoes x 1  Inserts x 3 pair  Dx e11.9   No facility-administered encounter medications on file as of 11/08/2021.    Allergies (verified) Ace inhibitors   History: Past Medical History:  Diagnosis Date   Allergic rhinitis, seasonal    Arthritis    Breast cancer [facility]) dx 02/ 2015---  oncologist-  dr shadad/ dr Lisbeth Renshaw   dx Right breast upper-outer quadrant DCIS, high grade (Tis N0), ER+, PR negative--- 07-01-2013  s/p  right breast lumpectomy w/ sln bxs and radiation therapy (08-24-2013 to 09-20-2013)   Diabetes mellitus type II    Diabetic neuropathy (HCC)    Heart murmur    Hidradenitis axillaris    chronic   History of adenomatous polyp of colon  03-04-2008  tubular adenoma   History of radiation therapy 08-24-2013 to 09-20-2013   total 50Gy   Hyperlipidemia    Hypertension    Personal history of radiation therapy    PMB (postmenopausal bleeding) 05/14/2016   Thickened endometrium    Wears dentures    upper   Wears glasses    Past Surgical History:  Procedure Laterality Date   BREAST BIOPSY Right 04/16/2016   BREAST LUMPECTOMY Right 08/2013   BREAST LUMPECTOMY WITH NEEDLE LOCALIZATION AND AXILLARY SENTINEL LYMPH NODE BX Right 07/01/2013   Procedure: BREAST LUMPECTOMY WITH NEEDLE LOCALIZATION AND AXILLARY SENTINEL LYMPH NODE BX;  Surgeon: Merrie Roof, MD;  Location: [place];  Service: General;  Laterality: Right;   BREAST SURGERY  1970s   benign tumor removed from  unspecified breast    COLONOSCOPY N/A 11/27/2018   Procedure: COLONOSCOPY;  Surgeon: Danie Binder, MD;  [facility];  Service: Endoscopy;  Laterality: N/A;  8:30   COLONOSCOPY W/ POLYPECTOMY  03/04/2008   DILATATION & CURRETTAGE/HYSTEROSCOPY WITH RESECTOCOPE N/A 05/14/2016   Procedure: [place];  Surgeon: Eldred Manges, MD;  Location: [place];  Service: Gynecology;  Laterality: N/A;   POLYPECTOMY  11/27/2018   Procedure: POLYPECTOMY;  Surgeon: Danie Binder, MD;  [facility];  Service: Endoscopy;;  colon   Family History  Problem Relation Age of Onset   Cancer Brother 31       bone    Obesity Sister    Heart disease Mother    Cancer Sister 50       leukemia    Obesity Sister    Social History   Socioeconomic History   Marital status: Divorced    Spouse name: Not on file   Number of children: 1   Years of education: Not on file   Highest education level: Not on file  Occupational History   Occupation: Employed   Tobacco Use   Smoking status: Former    Packs/day: 1.00    Years: 25.00    Total pack years: 25.00    Types: Cigarettes    Quit date: 02/17/2002    Years since quitting: 19.7   Smokeless tobacco: Never  Substance and Sexual Activity   Alcohol use: No   Drug use: No   Sexual activity: Never  Other Topics Concern   Not on file  Social History Narrative   Not on file   Social Determinants of Health   Financial Resource Strain: Low Risk  (10/03/2020)   Overall Financial Resource Strain (CARDIA)    Difficulty of Paying Living Expenses: Not hard at all  Food Insecurity: No Food Insecurity (10/03/2020)   Hunger Vital Sign    Worried About Running Out of Food in the Last Year: Never true    [place] in the Last Year: Never true  Transportation Needs: No Transportation Needs (10/03/2020)   PRAPARE - Hydrologist (Medical): No    Lack of  Transportation (Non-Medical): No  Physical Activity: Inactive (10/03/2020)   Exercise Vital Sign    Days of Exercise per Week: 0 days    Minutes of Exercise per Session: 0 min  Stress: No Stress Concern Present (10/03/2020)   Sargeant    Feeling of Stress : Not at all  Social Connections: Socially Isolated (10/03/2020)   Social Connection and Isolation Panel [NHANES]  Frequency of Communication with Friends and Family: Three times a week    Frequency of Social Gatherings with Friends and Family: Twice a week    Attends Religious Services: Never    Marine scientist or Organizations: No    Attends Music therapist: Never    Marital Status: Divorced    Tobacco Counseling Counseling given: Not Answered   Clinical Intake:                 Diabetic?yes Nutrition Risk Assessment:  Has the patient had any N/V/D within the last 2 months?  No  Does the patient have any non-healing wounds? Yes  Has the patient had any unintentional weight loss or weight gain?  No   Diabetes:  Is the patient diabetic?  Yes  If diabetic, was a CBG obtained today?  No  Did the patient bring in their glucometer from home?  No  How often do you monitor your CBG's? Not often.   Financial Strains and Diabetes Management:  Are you having any financial strains with the device, your supplies or your medication? No .  Does the patient want to be seen by Chronic Care Management for management of their diabetes?  No  Would the patient like to be referred to a Nutritionist or for Diabetic Management?  No   Diabetic Exams:  Diabetic Eye Exam: Completed 08/15/20 Diabetic Foot Exam: Completed 06/20/21           Activities of Daily Living     No data to display           Patient Care Team: Fayrene Helper, MD as PCP - General (Family Medicine) Rothbart, Cristopher Estimable, MD (Inactive) (Cardiology) Jovita Kussmaul, MD as Consulting Physician (General Surgery) Rutherford Guys, MD as Consulting Physician (Ophthalmology)  Indicate any recent Medical Services you may have received from other than [facility] providers in the past year (date may be approximate).     Assessment:   This is a routine wellness examination for [place].  Hearing/Vision screen No results found.  Dietary issues and exercise activities discussed:     Goals Addressed   None   Depression Screen    10/26/2021    8:33 AM 06/20/2021    8:52 AM 02/02/2021    9:05 AM 10/03/2020    9:37 AM 10/03/2020    9:26 AM 04/19/2020    1:34 PM 03/29/2020    2:08 PM  PHQ 2/9 Scores  PHQ - 2 Score 0 0 0 0 0 0 0  PHQ- 9 Score   0        Fall Risk    10/26/2021    8:33 AM 06/20/2021    8:52 AM 02/02/2021    9:05 AM 10/06/2020    8:46 AM 10/03/2020    9:30 AM  Fall Risk   Falls in the past year? 0 0 '1 1 1  '$ Number falls in past yr: 0 0 1 0 0  Injury with Fall? 0 0 0 0 0  Risk for fall due to : No Fall Risks No Fall Risks   Impaired balance/gait  Follow up Falls evaluation completed Falls evaluation completed   Falls evaluation completed    FALL RISK PREVENTION PERTAINING TO THE HOME:  Any stairs in or around the home? No  If so, are there any without handrails? No  Home free of loose throw rugs in walkways, pet beds, electrical cords, etc? Yes  Adequate lighting in your  home to reduce risk of falls? Yes   ASSISTIVE DEVICES UTILIZED TO PREVENT FALLS:  Life alert? No  Use of a cane, walker or w/c? No  Grab bars in the bathroom? No  Shower chair or bench in shower? No  Elevated toilet seat or a handicapped toilet? No      04/28/2014    8:06 AM  MMSE - Mini Mental State Exam  Orientation to time 5  Orientation to Place 5  Registration 3  Attention/ Calculation 5  Recall 3  Language- name 2 objects 2  Language- repeat 1  Language- follow 3 step command 3  Language- read & follow direction 1  Write a sentence 1  Copy design 1   Total score 30        10/03/2020    9:37 AM 09/28/2019    8:52 AM 09/25/2018    9:52 AM 09/22/2017    9:40 AM 06/24/2016    9:34 AM  6CIT Screen  What Year? 0 points 0 points 0 points 0 points 0 points  What month? 0 points 0 points 0 points 0 points 0 points  What time? 0 points 0 points 0 points 0 points 0 points  Count back from 20 0 points 0 points 0 points 0 points 0 points  Months in reverse  0 points 0 points 0 points 0 points  Repeat phrase  0 points 0 points 0 points 0 points  Total Score  0 points 0 points 0 points 0 points    Immunizations Immunization History  Administered Date(s) Administered   Fluad Quad(high Dose 65+) 11/08/2019, 12/11/2020   Influenza Split 01/13/2012, 11/10/2018   Influenza,inj,Quad PF,6+ Mos 11/13/2012, 11/25/2013, 11/01/2014, 12/21/2015, 12/03/2016, 12/05/2017   Influenza-Unspecified 12/21/2015   PFIZER(Purple Top)SARS-COV-2 Vaccination 04/11/2019, 05/05/2019, 01/24/2020   Pneumococcal Conjugate-13 12/29/2014   Pneumococcal Polysaccharide-23 01/19/2016   Pneumococcal-Unspecified 01/19/2016   Tdap 06/05/2010, 09/10/2020   Zoster Recombinat (Shingrix) 06/22/2021   Zoster, Live 06/05/2010   Zoster, Unspecified 06/05/2010    TDAP status: Up to date  Flu Vaccine status: Due, Education has been provided regarding the importance of this vaccine. Advised may receive this vaccine at local pharmacy or Health Dept. Aware to provide a copy of the vaccination record if obtained from local pharmacy or Health Dept. Verbalized acceptance and understanding.  Pneumococcal vaccine status: Up to date  Covid-19 vaccine status: Completed vaccines  Qualifies for Shingles Vaccine? Yes   Zostavax completed Yes   Shingrix Completed?: No.    Education has been provided regarding the importance of this vaccine. Patient has been advised to call insurance company to determine out of pocket expense if they have not yet received this vaccine. Advised may also receive  vaccine at local pharmacy or Health Dept. Verbalized acceptance and understanding.  Screening Tests Health Maintenance  Topic Date Due   COVID-19 Vaccine (4 - Pfizer risk series) 03/20/2020   OPHTHALMOLOGY EXAM  08/15/2021   Zoster Vaccines- Shingrix (2 of 2) 08/17/2021   INFLUENZA VACCINE  02/06/2022 (Originally 09/18/2021)   Diabetic kidney evaluation - Urine ACR  02/02/2022   HEMOGLOBIN A1C  04/26/2022   FOOT EXAM  06/21/2022   Diabetic kidney evaluation - GFR measurement  10/24/2022   TETANUS/TDAP  09/11/2030   Pneumonia Vaccine 36+ Years old  Completed   DEXA SCAN  Completed   Hepatitis C Screening  Completed   HPV VACCINES  Aged Out   COLONOSCOPY (Pts 45-20yr Insurance coverage will need to be confirmed)  Discontinued  Health Maintenance  Health Maintenance Due  Topic Date Due   COVID-19 Vaccine (4 - Pfizer risk series) 03/20/2020   OPHTHALMOLOGY EXAM  08/15/2021   Zoster Vaccines- Shingrix (2 of 2) 08/17/2021    Colorectal cancer screening: No longer required.   Mammogram status: No longer required due to age.  Bone Density status: Completed 05/05/14. Results reflect: Bone density results: NORMAL. Repeat every   years.  Lung Cancer Screening: (Low Dose CT Chest recommended if Age 52-80 years, 30 pack-year currently smoking OR have quit w/in 15years.) does not qualify.   Lung Cancer Screening Referral: no  Additional Screening:  Hepatitis C Screening: does qualify; Completed 04/26/15  Vision Screening: Recommended annual ophthalmology exams for early detection of glaucoma and other disorders of the eye. Is the patient up to date with their annual eye exam?  Yes  Who is the provider or what is the name of the office in which the patient attends annual eye exams? N/a If pt is not established with a provider, would they like to be referred to a provider to establish care? No .   Dental Screening: Recommended annual dental exams for proper oral hygiene  Community  Resource Referral / Chronic Care Management: CRR required this visit?  No   CCM required this visit?  No      Plan:     I have personally reviewed and noted the following in the patient's chart:   Medical and social history Use of alcohol, tobacco or illicit drugs  Current medications and supplements including opioid prescriptions. Patient is not currently taking opioid prescriptions. Functional ability and status Nutritional status Physical activity Advanced directives List of other physicians Hospitalizations, surgeries, and ER visits in previous 12 months Vitals Screenings to include cognitive, depression, and falls Referrals and appointments  In addition, I have reviewed and discussed with patient certain preventive protocols, quality metrics, and best practice recommendations. A written personalized care plan for preventive services as well as general preventive health recommendations were provided to patient.     Quentin Angst, [place]   11/08/2021

## 2021-11-13 ENCOUNTER — Encounter: Payer: PPO | Admitting: Nurse Practitioner

## 2021-11-30 ENCOUNTER — Ambulatory Visit (INDEPENDENT_AMBULATORY_CARE_PROVIDER_SITE_OTHER): Payer: PPO | Admitting: Family Medicine

## 2021-11-30 ENCOUNTER — Encounter: Payer: Self-pay | Admitting: Family Medicine

## 2021-11-30 ENCOUNTER — Telehealth: Payer: Self-pay | Admitting: Family Medicine

## 2021-11-30 VITALS — BP 152/80 | HR 82 | Ht 69.0 in | Wt 275.0 lb

## 2021-11-30 DIAGNOSIS — Z23 Encounter for immunization: Secondary | ICD-10-CM | POA: Diagnosis not present

## 2021-11-30 DIAGNOSIS — I10 Essential (primary) hypertension: Secondary | ICD-10-CM

## 2021-11-30 DIAGNOSIS — E782 Mixed hyperlipidemia: Secondary | ICD-10-CM

## 2021-11-30 DIAGNOSIS — A630 Anogenital (venereal) warts: Secondary | ICD-10-CM | POA: Diagnosis not present

## 2021-11-30 DIAGNOSIS — E1140 Type 2 diabetes mellitus with diabetic neuropathy, unspecified: Secondary | ICD-10-CM | POA: Diagnosis not present

## 2021-11-30 MED ORDER — TIRZEPATIDE 5 MG/0.5ML ~~LOC~~ SOAJ: 5.0000 mg | SUBCUTANEOUS | 1 refills | Status: DC

## 2021-11-30 MED ORDER — VALSARTAN 80 MG PO TABS: 80.0000 mg | ORAL_TABLET | Freq: Every day | ORAL | 3 refills | Status: DC

## 2021-11-30 MED ORDER — TIRZEPATIDE 2.5 MG/0.5ML ~~LOC~~ SOAJ: 2.5000 mg | SUBCUTANEOUS | 0 refills | Status: DC

## 2021-11-30 NOTE — Progress Notes (Signed)
Theresa Adkins     MRN: [id number]      DOB: [birth year]   HPI Ms. Treloar is here for follow up and re-evaluation of chronic medical conditions, medication management and review of any available recent lab and radiology data.  Preventive health is updated, specifically  Cancer screening and Immunization.   Questions or concerns regarding consultations or procedures which the PT has had in the interim are  addressed.Still needs surgery eval annd is waiting till next year The PT denies any adverse reactions to current medications since the last visit.  Overeating and eating sweets, now interested in new additonal blood sugar med   ROS Denies recent fever or chills. Denies sinus pressure, nasal congestion, ear pain or sore throat. Denies chest congestion, productive cough or wheezing. Denies chest pains, palpitations and leg swelling Denies abdominal pain, nausea, vomiting,diarrhea or constipation.   Denies dysuria, frequency, hesitancy or incontinence. Denies uncontrolled  joint pain, swelling and limitation in mobility. Denies headaches, seizures, numbness, or tingling. Denies depression, anxiety or insomnia.    PE  BP 138/82 (BP Location: Left Leg, Patient Position: Sitting)   Pulse 82   Ht '5\' 9"'$  (1.753 m)   Wt 275 lb (124.7 kg)   SpO2 94%   BMI 40.61 kg/m   Patient alert and oriented and in no cardiopulmonary distress.  HEENT: No facial asymmetry, EOMI,     Neck supple .  Chest: Clear to auscultation bilaterally.  CVS: S1, S2 no murmurs, no S3.Regular rate.  ABD: Soft non tender.   Ext: No edema  MS: Adequate though reduced ROM spine, shoulders, hips and knees.  Skin: Intact, no ulcerations or rash noted.  Psych: Good eye contact, normal affect. Memory intact not anxious or depressed appearing.  CNS: CN 2-12 intact, power,  normal throughout.no focal deficits noted.   Assessment & Plan  HTN (hypertension) Uncontrolled , add valsartan 80 mg daily DASH diet and  commitment to daily physical activity for a minimum of 30 minutes discussed and encouraged, as a part of hypertension management. The importance of attaining a healthy weight is also discussed.     11/30/2021    9:08 AM 11/30/2021    8:40 AM 10/26/2021    9:35 AM 10/26/2021    8:40 AM 10/26/2021    8:30 AM 06/20/2021    8:51 AM 02/02/2021    9:02 AM  BP/Weight  Systolic BP 644 [phone]  Diastolic BP 80 82 80 68 78 65 70  Wt. (Lbs)  275   272.08 262.08 274.04  BMI  40.61 kg/m2   40.18 kg/m2 38.7 kg/m2 40.47 kg/m2       Controlled type 2 diabetes with neuropathy [facility]) Ms. Macy is reminded of the importance of commitment to daily physical activity for 30 minutes or more, as able and the need to limit carbohydrate intake to 30 to 60 grams per meal to help with blood sugar control.   The need to take medication as prescribed, test blood sugar as directed, and to call between visits if there is a concern that blood sugar is uncontrolled is also discussed.   Ms. Chestnutt is reminded of the importance of daily foot exam, annual eye examination, and good blood sugar, blood pressure and cholesterol control.     Latest Ref Rng & Units 10/26/2021    9:25 AM 10/23/2021   11:11 AM 06/18/2021    9:56 AM 02/02/2021   11:20 AM 01/30/2021   11:21 AM  Diabetic Labs  HbA1c 0.0 - 7.0 % 4.0 - 5.6 % 6.9    6.9   7.0   7.6   Micro/Creat Ratio 0 - 29 mg/g creat    15    Chol 100 - 199 mg/dL  164  176   182   HDL >39 mg/dL  50  49   47   Calc LDL 0 - 99 mg/dL  75  89   96   Triglycerides 0 - 149 mg/dL  237  230   230   Creatinine 0.57 - 1.00 mg/dL  0.63  0.63   0.71       11/30/2021    9:08 AM 11/30/2021    8:40 AM 10/26/2021    9:35 AM 10/26/2021    8:40 AM 10/26/2021    8:30 AM 06/20/2021    8:51 AM 02/02/2021    9:02 AM  BP/Weight  Systolic BP 809 983 382 505 [phone]  Diastolic BP 80 82 80 68 78 65 70  Wt. (Lbs)  275   272.08 262.08 274.04  BMI  40.61 kg/m2   40.18 kg/m2 38.7 kg/m2  40.47 kg/m2      06/20/2021    8:40 AM 02/02/2021    9:00 AM  Foot/eye exam completion dates  Foot Form Completion Done Done      Add mounjaro weekly, 2.5 mg andthen 5 mg, may need to reduce metformin  Morbid obesity ([place])  Patient re-educated about  the importance of commitment to a  minimum of 150 minutes of exercise per week as able.  The importance of healthy food choices with portion control discussed, as well as eating regularly and within a 12 hour window most days. The need to choose "clean , green" food 50 to 75% of the time is discussed, as well as to make water the primary drink and set a goal of 64 ounces water daily.       11/30/2021    8:40 AM 10/26/2021    8:30 AM 06/20/2021    8:51 AM  Weight /BMI  Weight 275 lb 272 lb 1.3 oz 262 lb 1.3 oz  Height '5\' 9"'$  (1.753 m) '5\' 9"'$  (1.753 m) '5\' 9"'$  (1.753 m)  BMI 40.61 kg/m2 40.18 kg/m2 38.7 kg/m2      Anogenital warts Refer to colorectal surgeon, Dr Leighton Ruff

## 2021-11-30 NOTE — Telephone Encounter (Signed)
Patient called in regard to tirzepatide [facility]) 2.5 MG/0.5ML Pen    Med is too expensive for patient. Wants to check on alternatives, wants a cll back

## 2021-11-30 NOTE — Assessment & Plan Note (Signed)
  Patient re-educated about  the importance of commitment to a  minimum of 150 minutes of exercise per week as able.  The importance of healthy food choices with portion control discussed, as well as eating regularly and within a 12 hour window most days. The need to choose "clean , green" food 50 to 75% of the time is discussed, as well as to make water the primary drink and set a goal of 64 ounces water daily.       11/30/2021    8:40 AM 10/26/2021    8:30 AM 06/20/2021    8:51 AM  Weight /BMI  Weight 275 lb 272 lb 1.3 oz 262 lb 1.3 oz  Height '5\' 9"'$  (1.753 m) '5\' 9"'$  (1.753 m) '5\' 9"'$  (1.753 m)  BMI 40.61 kg/m2 40.18 kg/m2 38.7 kg/m2

## 2021-11-30 NOTE — Assessment & Plan Note (Signed)
Uncontrolled , add valsartan 80 mg daily DASH diet and commitment to daily physical activity for a minimum of 30 minutes discussed and encouraged, as a part of hypertension management. The importance of attaining a healthy weight is also discussed.     11/30/2021    9:08 AM 11/30/2021    8:40 AM 10/26/2021    9:35 AM 10/26/2021    8:40 AM 10/26/2021    8:30 AM 06/20/2021    8:51 AM 02/02/2021    9:02 AM  BP/Weight  Systolic BP 198 022 179 [phone] 824  Diastolic BP 80 82 80 68 78 65 70  Wt. (Lbs)  275   272.08 262.08 274.04  BMI  40.61 kg/m2   40.18 kg/m2 38.7 kg/m2 40.47 kg/m2

## 2021-11-30 NOTE — Assessment & Plan Note (Signed)
Theresa Adkins is reminded of the importance of commitment to daily physical activity for 30 minutes or more, as able and the need to limit carbohydrate intake to 30 to 60 grams per meal to help with blood sugar control.   The need to take medication as prescribed, test blood sugar as directed, and to call between visits if there is a concern that blood sugar is uncontrolled is also discussed.   Theresa Adkins is reminded of the importance of daily foot exam, annual eye examination, and good blood sugar, blood pressure and cholesterol control.     Latest Ref Rng & Units 10/26/2021    9:25 AM 10/23/2021   11:11 AM 06/18/2021    9:56 AM 02/02/2021   11:20 AM 01/30/2021   11:21 AM  Diabetic Labs  HbA1c 0.0 - 7.0 % 4.0 - 5.6 % 6.9    6.9   7.0   7.6   Micro/Creat Ratio 0 - 29 mg/g creat    15    Chol 100 - 199 mg/dL  164  176   182   HDL >39 mg/dL  50  49   47   Calc LDL 0 - 99 mg/dL  75  89   96   Triglycerides 0 - 149 mg/dL  237  230   230   Creatinine 0.57 - 1.00 mg/dL  0.63  0.63   0.71       11/30/2021    9:08 AM 11/30/2021    8:40 AM 10/26/2021    9:35 AM 10/26/2021    8:40 AM 10/26/2021    8:30 AM 06/20/2021    8:51 AM 02/02/2021    9:02 AM  BP/Weight  Systolic BP 644 034 742 595 [phone]  Diastolic BP 80 82 80 68 78 65 70  Wt. (Lbs)  275   272.08 262.08 274.04  BMI  40.61 kg/m2   40.18 kg/m2 38.7 kg/m2 40.47 kg/m2      06/20/2021    8:40 AM 02/02/2021    9:00 AM  Foot/eye exam completion dates  Foot Form Completion Done Done      Add mounjaro weekly, 2.5 mg andthen 5 mg, may need to reduce metformin

## 2021-11-30 NOTE — Assessment & Plan Note (Signed)
Refer to colorectal surgeon, Dr Leighton Ruff

## 2021-11-30 NOTE — Patient Instructions (Addendum)
Keep follow-up with his appointment in December as before, call if you need me sooner.  Annual exam in mid to end January, please schedule at checkout  New additional medication for blood pressure is losartan 80 mg 1 daily.  Continue other blood pressure medications as before.  New additional medication for diabetes management is Mounjaro 2.5 mg once weekly.  You will have this dose for the first 4 weeks and the next 4 weeks the dose is being increased to 5 mg weekly.( Nurse please follow through on the [facility], or provide me with covered alternative, asap, please)  Flu vaccine today.  Fasting lipid panel CMP and EGFR and HbA1c 3 to 5 days before your next visit.  Please modify diet by reducing sweets and ice cream.  You are being referred to the surgeon in [facility] for a January appointment.  Thanks for choosing [facility], we consider it a privelige to serve you.

## 2021-12-04 NOTE — Telephone Encounter (Signed)
Patient picked up medication on 11/30/21 for a $40 copay.

## 2021-12-11 ENCOUNTER — Other Ambulatory Visit: Payer: Self-pay | Admitting: Family Medicine

## 2021-12-11 DIAGNOSIS — E785 Hyperlipidemia, unspecified: Secondary | ICD-10-CM

## 2021-12-30 ENCOUNTER — Other Ambulatory Visit: Payer: Self-pay | Admitting: Family Medicine

## 2022-01-07 DIAGNOSIS — L732 Hidradenitis suppurativa: Secondary | ICD-10-CM | POA: Diagnosis not present

## 2022-01-09 ENCOUNTER — Other Ambulatory Visit: Payer: Self-pay | Admitting: Family Medicine

## 2022-01-21 DIAGNOSIS — E1140 Type 2 diabetes mellitus with diabetic neuropathy, unspecified: Secondary | ICD-10-CM | POA: Diagnosis not present

## 2022-01-21 DIAGNOSIS — E782 Mixed hyperlipidemia: Secondary | ICD-10-CM | POA: Diagnosis not present

## 2022-01-21 DIAGNOSIS — I10 Essential (primary) hypertension: Secondary | ICD-10-CM | POA: Diagnosis not present

## 2022-01-22 LAB — CMP14+EGFR
ALT: 19 IU/L (ref 0–32)
AST: 19 IU/L (ref 0–40)
Albumin/Globulin Ratio: 1.6 (ref 1.2–2.2)
Albumin: 4.2 g/dL (ref 3.8–4.8)
Alkaline Phosphatase: 54 IU/L (ref 44–121)
BUN/Creatinine Ratio: 10 — ABNORMAL LOW (ref 12–28)
BUN: 8 mg/dL (ref 8–27)
Bilirubin Total: 0.3 mg/dL (ref 0.0–1.2)
CO2: 23 mmol/L (ref 20–29)
Calcium: 9.6 mg/dL (ref 8.7–10.3)
Chloride: 102 mmol/L (ref 96–106)
Creatinine, Ser: 0.83 mg/dL (ref 0.57–1.00)
Globulin, Total: 2.7 g/dL (ref 1.5–4.5)
Glucose: 148 mg/dL — ABNORMAL HIGH (ref 70–99)
Potassium: 4.5 mmol/L (ref 3.5–5.2)
Sodium: 142 mmol/L (ref 134–144)
Total Protein: 6.9 g/dL (ref 6.0–8.5)
eGFR: 73 mL/min/{1.73_m2} (ref >59)

## 2022-01-22 LAB — LIPID PANEL
Chol/HDL Ratio: 3.8 ratio (ref 0.0–4.4)
Cholesterol, Total: 160 mg/dL (ref 100–199)
HDL: 42 mg/dL (ref >39)
LDL Chol Calc (NIH): 91 mg/dL (ref 0–99)
Triglycerides: 153 mg/dL — ABNORMAL HIGH (ref 0–149)
VLDL Cholesterol Cal: 27 mg/dL (ref 5–40)

## 2022-01-22 LAB — HEMOGLOBIN A1C
Est. average glucose Bld gHb Est-mCnc: 169 mg/dL
Hgb A1c MFr Bld: 7.5 % — ABNORMAL HIGH (ref 4.8–5.6)

## 2022-01-24 ENCOUNTER — Encounter: Payer: Self-pay | Admitting: Family Medicine

## 2022-01-24 ENCOUNTER — Ambulatory Visit (INDEPENDENT_AMBULATORY_CARE_PROVIDER_SITE_OTHER): Payer: PPO | Admitting: Family Medicine

## 2022-01-24 VITALS — BP 128/70 | HR 72 | Ht 69.0 in | Wt 274.0 lb

## 2022-01-24 DIAGNOSIS — E1140 Type 2 diabetes mellitus with diabetic neuropathy, unspecified: Secondary | ICD-10-CM | POA: Diagnosis not present

## 2022-01-24 DIAGNOSIS — E782 Mixed hyperlipidemia: Secondary | ICD-10-CM

## 2022-01-24 DIAGNOSIS — L732 Hidradenitis suppurativa: Secondary | ICD-10-CM | POA: Diagnosis not present

## 2022-01-24 DIAGNOSIS — I10 Essential (primary) hypertension: Secondary | ICD-10-CM

## 2022-01-24 MED ORDER — EMPAGLIFLOZIN 25 MG PO TABS: 25.0000 mg | ORAL_TABLET | Freq: Every day | ORAL | 1 refills | Status: DC

## 2022-01-24 NOTE — Patient Instructions (Signed)
Follow-up in 13 weeks, call if you need me sooner.  Blood pressure is at goal, and current cholesterol has improved greatly.  Kidney and liver function are normal.  Work needs to be done on blood sugars starting with food choices.  New medication is prescribed Jardiance 25 mg 1 daily for your blood sugar control.  Continue metformin 1000 mg daily as before.  Nurse please obtain eye exam done in June 2023 for documentation.  Nonfasting HbA1c Chem-7 and EGFR to be done 3 to 5 days before your next visit also urine to be sent in for testing microalbuminuria.  Please do call back and let me know which dermatologist you are choosing to see.  You can look at the [facility] and see if there is any doctor there who you would choose otherwise if not, just please do try to locate a provider as soon as possible.  Shingrix vaccine #2 is past due please get this at your pharmacy

## 2022-01-27 ENCOUNTER — Encounter: Payer: Self-pay | Admitting: Family Medicine

## 2022-01-27 NOTE — Assessment & Plan Note (Signed)
Hyperlipidemia:Low fat diet discussed and encouraged.   Lipid Panel  Lab Results  Component Value Date   CHOL 160 01/21/2022   HDL 42 01/21/2022   LDLCALC 91 01/21/2022   TRIG 153 (H) 01/21/2022   CHOLHDL 3.8 01/21/2022     Needs to reduce dietary fat, TG slightly elevated

## 2022-01-27 NOTE — Assessment & Plan Note (Signed)
Undecided as far as definitive management at this time

## 2022-01-27 NOTE — Assessment & Plan Note (Signed)
  Patient re-educated about  the importance of commitment to a  minimum of 150 minutes of exercise per week as able.  The importance of healthy food choices with portion control discussed, as well as eating regularly and within a 12 hour window most days. The need to choose "clean , green" food 50 to 75% of the time is discussed, as well as to make water the primary drink and set a goal of 64 ounces water daily.       01/24/2022    8:43 AM 11/30/2021    8:40 AM 10/26/2021    8:30 AM  Weight /BMI  Weight 274 lb 0.6 oz 275 lb 272 lb 1.3 oz  Height '5\' 9"'$  (1.753 m) '5\' 9"'$  (1.753 m) '5\' 9"'$  (1.753 m)  BMI 40.47 kg/m2 40.61 kg/m2 40.18 kg/m2    unchnaged

## 2022-01-27 NOTE — Progress Notes (Signed)
Theresa Adkins     MRN: [id number]      DOB: [birth year]   HPI Theresa Adkins is here for follow up and re-evaluation of chronic medical conditions, medication management and review of any available recent lab and radiology data.  Preventive health is updated, specifically  Cancer screening and Immunization.   Questions or concerns regarding consultations or procedures which the PT has had in the interim are  addressed.Still considering next course of action re hydradenitis  The PT denies any adverse reactions to current medications since the last visit.  There are no new concerns.  There are no specific complaints  Denies polyuria, polydipsia, blurred vision , or hypoglycemic episodes. Blood sugar uncontrolled because of poor food choice states she will work on that  ROS Denies recent fever or chills. Denies sinus pressure, nasal congestion, ear pain or sore throat. Denies chest congestion, productive cough or wheezing. Denies chest pains, palpitations and leg swelling Denies abdominal pain, nausea, vomiting,diarrhea or constipation.   Denies dysuria, frequency, hesitancy or incontinence. Denies joint pain, swelling and limitation in mobility. Denies headaches, seizures, numbness, or tingling. Denies depression, anxiety or insomnia. Marland Kitchen   PE  BP 128/70   Pulse 72   Ht '5\' 9"'$  (1.753 m)   Wt 274 lb 0.6 oz (124.3 kg)   SpO2 91%   BMI 40.47 kg/m   Patient alert and oriented and in no cardiopulmonary distress.  HEENT: No facial asymmetry, EOMI,     Neck supple .  Chest: Clear to auscultation bilaterally.  CVS: S1, S2 no murmurs, no S3.Regular rate.  ABD: Soft non tender.   Ext: No edema  MS: Adequate though reduced  ROM spine, shoulders, hips and knees.  Skin: Intact, no ulcerations or rash noted.  Psych: Good eye contact, normal affect. Memory intact not anxious or depressed appearing.  CNS: CN 2-12 intact, power,  normal throughout.no focal deficits noted.   Assessment  & Plan  HTN (hypertension) Controlled, no change in medication DASH diet and commitment to daily physical activity for a minimum of 30 minutes discussed and encouraged, as a part of hypertension management. The importance of attaining a healthy weight is also discussed.     01/24/2022    9:32 AM 01/24/2022    8:43 AM 11/30/2021    9:08 AM 11/30/2021    8:40 AM 10/26/2021    9:35 AM 10/26/2021    8:40 AM 10/26/2021    8:30 AM  BP/Weight  Systolic BP 903 009 233 007 622 633 354  Diastolic BP 70 75 80 82 80 68 78  Wt. (Lbs)  274.04  275   272.08  BMI  40.47 kg/m2  40.61 kg/m2   40.18 kg/m2       Controlled type 2 diabetes with neuropathy [facility]) Theresa Adkins is reminded of the importance of commitment to daily physical activity for 30 minutes or more, as able and the need to limit carbohydrate intake to 30 to 60 grams per meal to help with blood sugar control.   The need to take medication as prescribed, test blood sugar as directed, and to call between visits if there is a concern that blood sugar is uncontrolled is also discussed.   Theresa Adkins is reminded of the importance of daily foot exam, annual eye examination, and good blood sugar, blood pressure and cholesterol control. Deteriorated, needs to change food choice    Latest Ref Rng & Units 01/21/2022    9:15 AM 10/26/2021  9:25 AM 10/23/2021   11:11 AM 06/18/2021    9:56 AM 02/02/2021   11:20 AM  Diabetic Labs  HbA1c 4.8 - 5.6 % 7.5  6.9    6.9   7.0    Micro/Creat Ratio 0 - 29 mg/g creat     15   Chol 100 - 199 mg/dL 160   164  176    HDL >39 mg/dL 42   50  49    Calc LDL 0 - 99 mg/dL 91   75  89    Triglycerides 0 - 149 mg/dL 153   237  230    Creatinine 0.57 - 1.00 mg/dL 0.83   0.63  0.63        01/24/2022    9:32 AM 01/24/2022    8:43 AM 11/30/2021    9:08 AM 11/30/2021    8:40 AM 10/26/2021    9:35 AM 10/26/2021    8:40 AM 10/26/2021    8:30 AM  BP/Weight  Systolic BP 660 600 459 977 414 239 532  Diastolic BP 70 75 80 82 80 68  78  Wt. (Lbs)  274.04  275   272.08  BMI  40.47 kg/m2  40.61 kg/m2   40.18 kg/m2      06/20/2021    8:40 AM 02/02/2021    9:00 AM  Foot/eye exam completion dates  Foot Form Completion Done Done        Hidradenitis Undecided as far as definitive management at this time  Morbid obesity ([place])  Patient re-educated about  the importance of commitment to a  minimum of 150 minutes of exercise per week as able.  The importance of healthy food choices with portion control discussed, as well as eating regularly and within a 12 hour window most days. The need to choose "clean , green" food 50 to 75% of the time is discussed, as well as to make water the primary drink and set a goal of 64 ounces water daily.       01/24/2022    8:43 AM 11/30/2021    8:40 AM 10/26/2021    8:30 AM  Weight /BMI  Weight 274 lb 0.6 oz 275 lb 272 lb 1.3 oz  Height '5\' 9"'$  (1.753 m) '5\' 9"'$  (1.753 m) '5\' 9"'$  (1.753 m)  BMI 40.47 kg/m2 40.61 kg/m2 40.18 kg/m2    unchnaged  Hyperlipidemia Hyperlipidemia:Low fat diet discussed and encouraged.   Lipid Panel  Lab Results  Component Value Date   CHOL 160 01/21/2022   HDL 42 01/21/2022   LDLCALC 91 01/21/2022   TRIG 153 (H) 01/21/2022   CHOLHDL 3.8 01/21/2022     Needs to reduce dietary fat, TG slightly elevated

## 2022-01-27 NOTE — Assessment & Plan Note (Signed)
Ms. Albin is reminded of the importance of commitment to daily physical activity for 30 minutes or more, as able and the need to limit carbohydrate intake to 30 to 60 grams per meal to help with blood sugar control.   The need to take medication as prescribed, test blood sugar as directed, and to call between visits if there is a concern that blood sugar is uncontrolled is also discussed.   Ms. Ess is reminded of the importance of daily foot exam, annual eye examination, and good blood sugar, blood pressure and cholesterol control. Deteriorated, needs to change food choice    Latest Ref Rng & Units 01/21/2022    9:15 AM 10/26/2021    9:25 AM 10/23/2021   11:11 AM 06/18/2021    9:56 AM 02/02/2021   11:20 AM  Diabetic Labs  HbA1c 4.8 - 5.6 % 7.5  6.9    6.9   7.0    Micro/Creat Ratio 0 - 29 mg/g creat     15   Chol 100 - 199 mg/dL 160   164  176    HDL >39 mg/dL 42   50  49    Calc LDL 0 - 99 mg/dL 91   75  89    Triglycerides 0 - 149 mg/dL 153   237  230    Creatinine 0.57 - 1.00 mg/dL 0.83   0.63  0.63        01/24/2022    9:32 AM 01/24/2022    8:43 AM 11/30/2021    9:08 AM 11/30/2021    8:40 AM 10/26/2021    9:35 AM 10/26/2021    8:40 AM 10/26/2021    8:30 AM  BP/Weight  Systolic BP 829 562 130 865 784 696 295  Diastolic BP 70 75 80 82 80 68 78  Wt. (Lbs)  274.04  275   272.08  BMI  40.47 kg/m2  40.61 kg/m2   40.18 kg/m2      06/20/2021    8:40 AM 02/02/2021    9:00 AM  Foot/eye exam completion dates  Foot Form Completion Done Done

## 2022-01-27 NOTE — Assessment & Plan Note (Signed)
Controlled, no change in medication DASH diet and commitment to daily physical activity for a minimum of 30 minutes discussed and encouraged, as a part of hypertension management. The importance of attaining a healthy weight is also discussed.     01/24/2022    9:32 AM 01/24/2022    8:43 AM 11/30/2021    9:08 AM 11/30/2021    8:40 AM 10/26/2021    9:35 AM 10/26/2021    8:40 AM 10/26/2021    8:30 AM  BP/Weight  Systolic BP 315 400 867 619 509 326 712  Diastolic BP 70 75 80 82 80 68 78  Wt. (Lbs)  274.04  275   272.08  BMI  40.47 kg/m2  40.61 kg/m2   40.18 kg/m2

## 2022-02-27 ENCOUNTER — Other Ambulatory Visit: Payer: Self-pay | Admitting: Family Medicine

## 2022-03-13 ENCOUNTER — Other Ambulatory Visit: Payer: Self-pay | Admitting: Family Medicine

## 2022-03-13 DIAGNOSIS — E785 Hyperlipidemia, unspecified: Secondary | ICD-10-CM

## 2022-03-29 ENCOUNTER — Other Ambulatory Visit: Payer: Self-pay | Admitting: Family Medicine

## 2022-04-16 ENCOUNTER — Other Ambulatory Visit: Payer: Self-pay | Admitting: Family Medicine

## 2022-04-17 ENCOUNTER — Telehealth: Payer: Self-pay | Admitting: Family Medicine

## 2022-04-17 ENCOUNTER — Other Ambulatory Visit: Payer: Self-pay

## 2022-04-17 MED ORDER — FENOFIBRATE 48 MG PO TABS: 48.0000 mg | ORAL_TABLET | Freq: Every day | ORAL | 1 refills | Status: DC

## 2022-04-17 NOTE — Telephone Encounter (Signed)
90 day supply with refill sent to [facility] per patient request

## 2022-04-17 NOTE — Telephone Encounter (Signed)
Prescription Request  04/17/2022   LOV: 01/24/2022  What is the name of the medication or equipment? fenofibrate (TRICOR) 48 MG tablet   WANTS 90-DAY QTY    Have you contacted your pharmacy to request a refill? No   Which pharmacy would you like this sent to?  [place] [place] [facility] [id number] [place] [address] Phone: [phone] Fax: [phone]    Patient notified that their request is being sent to the clinical staff for review and that they should receive a response within 2 business days.   Please advise at [place]

## 2022-04-29 DIAGNOSIS — E1140 Type 2 diabetes mellitus with diabetic neuropathy, unspecified: Secondary | ICD-10-CM | POA: Diagnosis not present

## 2022-05-01 LAB — BMP8+EGFR
BUN/Creatinine Ratio: 6 — ABNORMAL LOW (ref 12–28)
BUN: 5 mg/dL — ABNORMAL LOW (ref 8–27)
CO2: 23 mmol/L (ref 20–29)
Calcium: 9.5 mg/dL (ref 8.7–10.3)
Chloride: 101 mmol/L (ref 96–106)
Creatinine, Ser: 0.82 mg/dL (ref 0.57–1.00)
Glucose: 121 mg/dL — ABNORMAL HIGH (ref 70–99)
Potassium: 4.1 mmol/L (ref 3.5–5.2)
Sodium: 141 mmol/L (ref 134–144)
eGFR: 74 mL/min/{1.73_m2} (ref >59)

## 2022-05-01 LAB — HEMOGLOBIN A1C
Est. average glucose Bld gHb Est-mCnc: 146 mg/dL
Hgb A1c MFr Bld: 6.7 % — ABNORMAL HIGH (ref 4.8–5.6)

## 2022-05-01 LAB — MICROALBUMIN / CREATININE URINE RATIO
Creatinine, Urine: 134.4 mg/dL
Microalb/Creat Ratio: 18 mg/g creat (ref 0–29)
Microalbumin, Urine: 23.8 ug/mL

## 2022-05-02 ENCOUNTER — Ambulatory Visit (INDEPENDENT_AMBULATORY_CARE_PROVIDER_SITE_OTHER): Payer: PPO | Admitting: Family Medicine

## 2022-05-02 ENCOUNTER — Encounter: Payer: Self-pay | Admitting: Family Medicine

## 2022-05-02 VITALS — BP 125/65 | HR 66 | Ht 69.0 in | Wt 248.0 lb

## 2022-05-02 DIAGNOSIS — E782 Mixed hyperlipidemia: Secondary | ICD-10-CM

## 2022-05-02 DIAGNOSIS — E1140 Type 2 diabetes mellitus with diabetic neuropathy, unspecified: Secondary | ICD-10-CM | POA: Diagnosis not present

## 2022-05-02 DIAGNOSIS — I10 Essential (primary) hypertension: Secondary | ICD-10-CM | POA: Diagnosis not present

## 2022-05-02 NOTE — Progress Notes (Signed)
Theresa Adkins     MRN: [id number]      DOB: [birth year]   HPI Ms. Hibdon is here for follow up and re-evaluation of chronic medical conditions, medication management and review of any available recent lab and radiology data.  Preventive health is updated, specifically  Cancer screening and Immunization.   Questions or concerns regarding consultations or procedures which the PT has had in the interim are  addressed. The PT denies any adverse reactions to current medications since the last visit.  There are no new concerns.  There are no specific complaints  Major change in food choice, still needs to commit to exercise, excellent weight loss and improved health  ROS Denies recent fever or chills. Denies sinus pressure, nasal congestion, ear pain or sore throat. Denies chest congestion, productive cough or wheezing. Denies chest pains, palpitations and leg swelling Denies abdominal pain, nausea, vomiting,diarrhea or constipation.   Denies dysuria, frequency, hesitancy or incontinence. Denies joint pain, swelling and limitation in mobility. Denies headaches, seizures, numbness, or tingling. Denies depression, anxiety or insomnia. Marland Kitchen   PE BP 125/65 (BP Location: Right Arm, Patient Position: Sitting, Cuff Size: Large)   Pulse 66   Ht 5\' 9"  (1.753 m)   Wt 248 lb (112.5 kg)   SpO2 92%   BMI 36.62 kg/m    Patient alert and oriented and in no cardiopulmonary distress.  HEENT: No facial asymmetry, EOMI,     Neck supple .  Chest: Clear to auscultation bilaterally.  CVS: S1, S2 no murmurs, no S3.Regular rate.  ABD: Soft non tender.   Ext: No edema  MS: Adequate ROM spine, shoulders, hips and knees.  Skin: Intact, no ulcerations or rash noted.  Psych: Good eye contact, normal affect. Memory intact not anxious or depressed appearing.  CNS: CN 2-12 intact, power,  normal throughout.no focal deficits noted.   Assessment & Plan  HTN (hypertension) Over corrected, ziac dose to  be reduced to half and re eval in 6 to 8 weeks DASH diet and commitment to daily physical activity for a minimum of 30 minutes discussed and encouraged, as a part of hypertension management. The importance of attaining a healthy weight is also discussed.     05/02/2022    8:05 AM 01/24/2022    9:32 AM 01/24/2022    8:43 AM 11/30/2021    9:08 AM 11/30/2021    8:40 AM 10/26/2021    9:35 AM 10/26/2021    8:40 AM  BP/Weight  Systolic BP 0000000 0000000 0000000 0000000 0000000 [id number] [id number]  Diastolic BP 65 70 75 80 82 80 68  Wt. (Lbs) 248  274.04  275    BMI 36.62 kg/m2  40.47 kg/m2  40.61 kg/m2         Controlled type 2 diabetes with neuropathy (HCC) Marked improvement, congratulated on this, no med change needed Ms. Herrmann is reminded of the importance of commitment to daily physical activity for 30 minutes or more, as able and the need to limit carbohydrate intake to 30 to 60 grams per meal to help with blood sugar control.   The need to take medication as prescribed, test blood sugar as directed, and to call between visits if there is a concern that blood sugar is uncontrolled is also discussed.   Ms. Rabbitt is reminded of the importance of daily foot exam, annual eye examination, and good blood sugar, blood pressure and cholesterol control.     Latest Ref Rng & Units 04/29/2022  9:30 AM 01/21/2022    9:15 AM 10/26/2021    9:25 AM 10/23/2021   11:11 AM 06/18/2021    9:56 AM  Diabetic Labs  HbA1c 4.8 - 5.6 % 6.7  7.5  6.9    6.9   7.0   Micro/Creat Ratio 0 - 29 mg/g creat 18       Chol 100 - 199 mg/dL  160   164  176   HDL >39 mg/dL  42   50  49   Calc LDL 0 - 99 mg/dL  91   75  89   Triglycerides 0 - 149 mg/dL  153   237  230   Creatinine 0.57 - 1.00 mg/dL 0.82  0.83   0.63  0.63       05/02/2022    8:05 AM 01/24/2022    9:32 AM 01/24/2022    8:43 AM 11/30/2021    9:08 AM 11/30/2021    8:40 AM 10/26/2021    9:35 AM 10/26/2021    8:40 AM  BP/Weight  Systolic BP 0000000 0000000 0000000 0000000 0000000 [id number]  Diastolic BP 65 70  75 80 82 80 68  Wt. (Lbs) 248  274.04  275    BMI 36.62 kg/m2  40.47 kg/m2  40.61 kg/m2        06/20/2021    8:40 AM 02/02/2021    9:00 AM  Foot/eye exam completion dates  Foot Form Completion Done Done        Morbid obesity ([place]) Marked improvement with lifestyle change/ diet change  Patient re-educated about  the importance of commitment to a  minimum of 150 minutes of exercise per week as able.  The importance of healthy food choices with portion control discussed, as well as eating regularly and within a 12 hour window most days. The need to choose "clean , green" food 50 to 75% of the time is discussed, as well as to make water the primary drink and set a goal of 64 ounces water daily.       05/02/2022    8:05 AM 01/24/2022    8:43 AM 11/30/2021    8:40 AM  Weight /BMI  Weight 248 lb 274 lb 0.6 oz 275 lb  Height 5\' 9"  (1.753 m) 5\' 9"  (1.753 m) 5\' 9"  (1.753 m)  BMI 36.62 kg/m2 40.47 kg/m2 40.61 kg/m2      Hyperlipidemia Hyperlipidemia:Low fat diet discussed and encouraged.   Lipid Panel  Lab Results  Component Value Date   CHOL 160 01/21/2022   HDL 42 01/21/2022   LDLCALC 91 01/21/2022   TRIG 153 (H) 01/21/2022   CHOLHDL 3.8 01/21/2022     Needs to reduce fat in diet Updated lab needed at/ before next visit.

## 2022-05-02 NOTE — Patient Instructions (Addendum)
F/U in 8 weeks, re eval blood pressure, call if you need me sooner  Blood pressure is low, cut the Ziac in half  CONGRATS on excellent blood sugar control and weight loss, keep it up  Please schedule your mammogram  No kidney damage It is important that you exercise regularly at least 30 minutes 5 times a week. If you develop chest pain, have severe difficulty breathing, or feel very tired, stop exercising immediately and seek medical attention   It is important that you exercise regularly at least 30 minutes 5 times a week. If you develop chest pain, have severe difficulty breathing, or feel very tired, stop exercising immediately and seek medical attention    Thanks for choosing [facility], we consider it a privelige to serve you.

## 2022-05-05 ENCOUNTER — Encounter: Payer: Self-pay | Admitting: Family Medicine

## 2022-05-05 NOTE — Assessment & Plan Note (Signed)
Marked improvement, congratulated on this, no med change needed Theresa Adkins is reminded of the importance of commitment to daily physical activity for 30 minutes or more, as able and the need to limit carbohydrate intake to 30 to 60 grams per meal to help with blood sugar control.   The need to take medication as prescribed, test blood sugar as directed, and to call between visits if there is a concern that blood sugar is uncontrolled is also discussed.   Theresa Adkins is reminded of the importance of daily foot exam, annual eye examination, and good blood sugar, blood pressure and cholesterol control.     Latest Ref Rng & Units 04/29/2022    9:30 AM 01/21/2022    9:15 AM 10/26/2021    9:25 AM 10/23/2021   11:11 AM 06/18/2021    9:56 AM  Diabetic Labs  HbA1c 4.8 - 5.6 % 6.7  7.5  6.9    6.9   7.0   Micro/Creat Ratio 0 - 29 mg/g creat 18       Chol 100 - 199 mg/dL  160   164  176   HDL >39 mg/dL  42   50  49   Calc LDL 0 - 99 mg/dL  91   75  89   Triglycerides 0 - 149 mg/dL  153   237  230   Creatinine 0.57 - 1.00 mg/dL 0.82  0.83   0.63  0.63       05/02/2022    8:05 AM 01/24/2022    9:32 AM 01/24/2022    8:43 AM 11/30/2021    9:08 AM 11/30/2021    8:40 AM 10/26/2021    9:35 AM 10/26/2021    8:40 AM  BP/Weight  Systolic BP 0000000 0000000 0000000 0000000 0000000 [id number]  Diastolic BP 65 70 75 80 82 80 68  Wt. (Lbs) 248  274.04  275    BMI 36.62 kg/m2  40.47 kg/m2  40.61 kg/m2        06/20/2021    8:40 AM 02/02/2021    9:00 AM  Foot/eye exam completion dates  Foot Form Completion Done Done

## 2022-05-05 NOTE — Assessment & Plan Note (Signed)
Marked improvement with lifestyle change/ diet change  Patient re-educated about  the importance of commitment to a  minimum of 150 minutes of exercise per week as able.  The importance of healthy food choices with portion control discussed, as well as eating regularly and within a 12 hour window most days. The need to choose "clean , green" food 50 to 75% of the time is discussed, as well as to make water the primary drink and set a goal of 64 ounces water daily.       05/02/2022    8:05 AM 01/24/2022    8:43 AM 11/30/2021    8:40 AM  Weight /BMI  Weight 248 lb 274 lb 0.6 oz 275 lb  Height 5\' 9"  (1.753 m) 5\' 9"  (1.753 m) 5\' 9"  (1.753 m)  BMI 36.62 kg/m2 40.47 kg/m2 40.61 kg/m2

## 2022-05-05 NOTE — Assessment & Plan Note (Signed)
Hyperlipidemia:Low fat diet discussed and encouraged.   Lipid Panel  Lab Results  Component Value Date   CHOL 160 01/21/2022   HDL 42 01/21/2022   LDLCALC 91 01/21/2022   TRIG 153 (H) 01/21/2022   CHOLHDL 3.8 01/21/2022     Needs to reduce fat in diet Updated lab needed at/ before next visit.

## 2022-05-05 NOTE — Assessment & Plan Note (Signed)
Over corrected, ziac dose to be reduced to half and re eval in 6 to 8 weeks DASH diet and commitment to daily physical activity for a minimum of 30 minutes discussed and encouraged, as a part of hypertension management. The importance of attaining a healthy weight is also discussed.     05/02/2022    8:05 AM 01/24/2022    9:32 AM 01/24/2022    8:43 AM 11/30/2021    9:08 AM 11/30/2021    8:40 AM 10/26/2021    9:35 AM 10/26/2021    8:40 AM  BP/Weight  Systolic BP 0000000 0000000 0000000 0000000 0000000 [id number] [id number]  Diastolic BP 65 70 75 80 82 80 68  Wt. (Lbs) 248  274.04  275    BMI 36.62 kg/m2  40.47 kg/m2  40.61 kg/m2

## 2022-05-29 ENCOUNTER — Other Ambulatory Visit: Payer: Self-pay | Admitting: Family Medicine

## 2022-05-29 DIAGNOSIS — Z1231 Encounter for screening mammogram for malignant neoplasm of breast: Secondary | ICD-10-CM

## 2022-06-13 ENCOUNTER — Telehealth: Payer: Self-pay | Admitting: Family Medicine

## 2022-06-13 NOTE — Telephone Encounter (Signed)
Patient called asking to please straighten out lab 12.04.2023 bill. She said [facility] [employer] told her to call our office it is done with our office of coding. Always pays 100% need to correct coding and refile. Please contact back # [phone].

## 2022-06-15 ENCOUNTER — Other Ambulatory Visit: Payer: Self-pay | Admitting: Family Medicine

## 2022-06-16 ENCOUNTER — Other Ambulatory Visit: Payer: Self-pay | Admitting: Family Medicine

## 2022-06-16 DIAGNOSIS — E785 Hyperlipidemia, unspecified: Secondary | ICD-10-CM

## 2022-06-27 ENCOUNTER — Telehealth: Payer: Self-pay

## 2022-06-27 ENCOUNTER — Encounter: Payer: Self-pay | Admitting: Family Medicine

## 2022-06-27 ENCOUNTER — Ambulatory Visit (INDEPENDENT_AMBULATORY_CARE_PROVIDER_SITE_OTHER): Payer: PPO | Admitting: Family Medicine

## 2022-06-27 VITALS — BP 136/76 | HR 66 | Ht 69.0 in | Wt 247.1 lb

## 2022-06-27 DIAGNOSIS — Z7984 Long term (current) use of oral hypoglycemic drugs: Secondary | ICD-10-CM

## 2022-06-27 DIAGNOSIS — E782 Mixed hyperlipidemia: Secondary | ICD-10-CM

## 2022-06-27 DIAGNOSIS — E790 Hyperuricemia without signs of inflammatory arthritis and tophaceous disease: Secondary | ICD-10-CM | POA: Diagnosis not present

## 2022-06-27 DIAGNOSIS — I10 Essential (primary) hypertension: Secondary | ICD-10-CM

## 2022-06-27 DIAGNOSIS — L989 Disorder of the skin and subcutaneous tissue, unspecified: Secondary | ICD-10-CM | POA: Diagnosis not present

## 2022-06-27 DIAGNOSIS — E559 Vitamin D deficiency, unspecified: Secondary | ICD-10-CM | POA: Diagnosis not present

## 2022-06-27 DIAGNOSIS — E1140 Type 2 diabetes mellitus with diabetic neuropathy, unspecified: Secondary | ICD-10-CM | POA: Diagnosis not present

## 2022-06-27 MED ORDER — TIRZEPATIDE 5 MG/0.5ML ~~LOC~~ SOAJ: 5.0000 mg | SUBCUTANEOUS | 1 refills | Status: DC

## 2022-06-27 MED ORDER — GABAPENTIN 100 MG PO CAPS: ORAL_CAPSULE | ORAL | 0 refills | Status: DC

## 2022-06-27 MED ORDER — CLOTRIMAZOLE-BETAMETHASONE 1-0.05 % EX CREA: 1.0000 | TOPICAL_CREAM | Freq: Two times a day (BID) | CUTANEOUS | 0 refills | Status: DC

## 2022-06-27 MED ORDER — TIRZEPATIDE 2.5 MG/0.5ML ~~LOC~~ SOAJ: 2.5000 mg | SUBCUTANEOUS | 0 refills | Status: DC

## 2022-06-27 NOTE — Patient Instructions (Addendum)
Annual exam with foot exam mid to  end July, call if you need me before  After completing jardiance , you will not refill and start once weekly Mounjaro  I Note that you report taking metformin sparingly  Medication sent for rash on right forearm x 2 weeks, if persists cvall , I will refer you to Dermatology  You are referred for eye exam  It is important that you exercise regularly at least 30 minutes 5 times a week. If you develop chest pain, have severe difficulty breathing, or feel very tired, stop exercising immediately and seek medical attention    Thanks for choosing [facility], we consider it a privelige to serve you.   Fasting lipid, cmp and eGFR, hBA1C, CBC, TSH and vit D , 3 to 5 days before next visit C/o dark spot on her right forearm x 2 weeks, do

## 2022-06-27 NOTE — Assessment & Plan Note (Addendum)
Theresa Adkins is reminded of the importance of commitment to daily physical activity for 30 minutes or more, as able and the need to limit carbohydrate intake to 30 to 60 grams per meal to help with blood sugar control.   The need to take medication as prescribed, test blood sugar as directed, and to call between visits if there is a concern that blood sugar is uncontrolled is also discussed.   Theresa Adkins is reminded of the importance of daily foot exam, annual eye examination, and good blood sugar, blood pressure and cholesterol control. Stop jardiance and start mounjao when next due, currently using metformin infreuently Updated lab needed at/ before next visit.      Latest Ref Rng & Units 04/29/2022    9:30 AM 01/21/2022    9:15 AM 10/26/2021    9:25 AM 10/23/2021   11:11 AM 06/18/2021    9:56 AM  Diabetic Labs  HbA1c 4.8 - 5.6 % 6.7  7.5  6.9    6.9   7.0   Micro/Creat Ratio 0 - 29 mg/g creat 18       Chol 100 - 199 mg/dL  161   096  045   HDL >40 mg/dL  42   50  49   Calc LDL 0 - 99 mg/dL  91   75  89   Triglycerides 0 - 149 mg/dL  981   191  478   Creatinine 0.57 - 1.00 mg/dL 2.95  6.21   3.08  6.57       06/27/2022    8:43 AM 05/02/2022    8:05 AM 01/24/2022    9:32 AM 01/24/2022    8:43 AM 11/30/2021    9:08 AM 11/30/2021    8:40 AM 10/26/2021    9:35 AM  BP/Weight  Systolic BP 136 125 128 137 152 138 150  Diastolic BP 76 65 70 75 80 82 80  Wt. (Lbs) 247.12 248  274.04  275   BMI 36.49 kg/m2 36.62 kg/m2  40.47 kg/m2  40.61 kg/m2       06/20/2021    8:40 AM 02/02/2021    9:00 AM  Foot/eye exam completion dates  Foot Form Completion Done Done

## 2022-06-27 NOTE — Telephone Encounter (Signed)
Reminder to check on patients bill

## 2022-06-28 NOTE — Telephone Encounter (Signed)
Unable to identify reason for balance based on Dx codes used. The patient is concerned about [employer], and not [employer]. Those questions will need to be directed to [facility]. [phone]

## 2022-07-01 ENCOUNTER — Encounter: Payer: Self-pay | Admitting: Family Medicine

## 2022-07-01 DIAGNOSIS — E790 Hyperuricemia without signs of inflammatory arthritis and tophaceous disease: Secondary | ICD-10-CM | POA: Insufficient documentation

## 2022-07-01 DIAGNOSIS — L989 Disorder of the skin and subcutaneous tissue, unspecified: Secondary | ICD-10-CM | POA: Insufficient documentation

## 2022-07-01 NOTE — Assessment & Plan Note (Signed)
Hyperlipidemia:Low fat diet discussed and encouraged.   Lipid Panel  Lab Results  Component Value Date   CHOL 160 01/21/2022   HDL 42 01/21/2022   LDLCALC 91 01/21/2022   TRIG 153 (H) 01/21/2022   CHOLHDL 3.8 01/21/2022     Updated lab needed at/ before next visit. Eeds to lower fat intake

## 2022-07-01 NOTE — Assessment & Plan Note (Signed)
Controlled, no change in medication DASH diet and commitment to daily physical activity for a minimum of 30 minutes discussed and encouraged, as a part of hypertension management. The importance of attaining a healthy weight is also discussed.     06/27/2022    8:43 AM 05/02/2022    8:05 AM 01/24/2022    9:32 AM 01/24/2022    8:43 AM 11/30/2021    9:08 AM 11/30/2021    8:40 AM 10/26/2021    9:35 AM  BP/Weight  Systolic BP 136 125 128 137 152 138 150  Diastolic BP 76 65 70 75 80 82 80  Wt. (Lbs) 247.12 248  274.04  275   BMI 36.49 kg/m2 36.62 kg/m2  40.47 kg/m2  40.61 kg/m2

## 2022-07-01 NOTE — Assessment & Plan Note (Signed)
Updated lab needed at/ before next visit.   

## 2022-07-01 NOTE — Progress Notes (Signed)
Theresa Adkins     MRN: [id number]      DOB: [birth year]  Chief Complaint  Patient presents with   Follow-up    Follow up, Spot on R arm    HPI Theresa Adkins is here for follow up and re-evaluation of chronic medical conditions, medication management and review of any available recent lab and radiology data.  Preventive health is updated, specifically  Cancer screening and Immunization.   Questions or concerns regarding consultations or procedures which the PT has had in the interim are  addressed. The PT denies any adverse reactions to current medications since the last visit.  C/o dark spot on right forearm, Itches at times, not red, no drainage from the area Denies polyuria, polydipsia, blurred vision , or hypoglycemic episodes. Uses metformin occasionally C/o difficulty with weight loss Chronic soreness of buttock with drainage, refuses visual inspction at visit   ROS Denies recent fever or chills. Denies sinus pressure, nasal congestion, ear pain or sore throat. Denies chest congestion, productive cough or wheezing. Denies chest pains, palpitations and leg swelling Denies abdominal pain, nausea, vomiting,diarrhea or constipation.   Denies dysuria, frequency, hesitancy or incontinence. C/ chronic  joint pain, swelling and limitation in mobility. Denies headaches, seizures, numbness, or tingling. Denies depression, anxiety or insomnia.  PE  BP 136/76 (BP Location: Right Arm, Patient Position: Sitting, Cuff Size: Large)   Pulse 66   Ht 5\' 9"  (1.753 m)   Wt 247 lb 1.9 oz (112.1 kg)   SpO2 94%   BMI 36.49 kg/m   Patient alert and oriented and in no cardiopulmonary distress.  HEENT: No facial asymmetry, EOMI,     Neck supple .  Chest: Clear to auscultation bilaterally.  CVS: S1, S2 no murmurs, no S3.Regular rate.  ABD: Soft non tender.   Ext: No edema  MS: Adequate ROM spine, shoulders, hips and knees.  Skin: Intact, no ulcerations hyperpigmented macular rash  noted.  Psych: Good eye contact, normal affect. Memory intact not anxious or depressed appearing.  CNS: CN 2-12 intact, power,  normal throughout.no focal deficits noted.   Assessment & Plan  Controlled type 2 diabetes with neuropathy [facility]) Theresa Adkins is reminded of the importance of commitment to daily physical activity for 30 minutes or more, as able and the need to limit carbohydrate intake to 30 to 60 grams per meal to help with blood sugar control.   The need to take medication as prescribed, test blood sugar as directed, and to call between visits if there is a concern that blood sugar is uncontrolled is also discussed.   Theresa Adkins is reminded of the importance of daily foot exam, annual eye examination, and good blood sugar, blood pressure and cholesterol control. Stop jardiance and start mounjao when next due, currently using metformin infreuently Updated lab needed at/ before next visit.      Latest Ref Rng & Units 04/29/2022    9:30 AM 01/21/2022    9:15 AM 10/26/2021    9:25 AM 10/23/2021   11:11 AM 06/18/2021    9:56 AM  Diabetic Labs  HbA1c 4.8 - 5.6 % 6.7  7.5  6.9    6.9   7.0   Micro/Creat Ratio 0 - 29 mg/g creat 18       Chol 100 - 199 mg/dL  409   811  914   HDL >78 mg/dL  42   50  49   Calc LDL 0 - 99 mg/dL  91  75  89   Triglycerides 0 - 149 mg/dL  308   657  846   Creatinine 0.57 - 1.00 mg/dL 9.62  9.52   8.41  3.24       06/27/2022    8:43 AM 05/02/2022    8:05 AM 01/24/2022    9:32 AM 01/24/2022    8:43 AM 11/30/2021    9:08 AM 11/30/2021    8:40 AM 10/26/2021    9:35 AM  BP/Weight  Systolic BP 136 125 128 137 152 138 150  Diastolic BP 76 65 70 75 80 82 80  Wt. (Lbs) 247.12 248  274.04  275   BMI 36.49 kg/m2 36.62 kg/m2  40.47 kg/m2  40.61 kg/m2       06/20/2021    8:40 AM 02/02/2021    9:00 AM  Foot/eye exam completion dates  Foot Form Completion Done Done        HTN (hypertension) Controlled, no change in medication DASH diet and commitment to  daily physical activity for a minimum of 30 minutes discussed and encouraged, as a part of hypertension management. The importance of attaining a healthy weight is also discussed.     06/27/2022    8:43 AM 05/02/2022    8:05 AM 01/24/2022    9:32 AM 01/24/2022    8:43 AM 11/30/2021    9:08 AM 11/30/2021    8:40 AM 10/26/2021    9:35 AM  BP/Weight  Systolic BP 136 125 128 137 152 138 150  Diastolic BP 76 65 70 75 80 82 80  Wt. (Lbs) 247.12 248  274.04  275   BMI 36.49 kg/m2 36.62 kg/m2  40.47 kg/m2  40.61 kg/m2        Hyperlipidemia Hyperlipidemia:Low fat diet discussed and encouraged.   Lipid Panel  Lab Results  Component Value Date   CHOL 160 01/21/2022   HDL 42 01/21/2022   LDLCALC 91 01/21/2022   TRIG 153 (H) 01/21/2022   CHOLHDL 3.8 01/21/2022     Updated lab needed at/ before next visit. Eeds to lower fat intake  Morbid obesity (HCC)  Patient re-educated about  the importance of commitment to a  minimum of 150 minutes of exercise per week as able.  The importance of healthy food choices with portion control discussed, as well as eating regularly and within a 12 hour window most days. The need to choose "clean , green" food 50 to 75% of the time is discussed, as well as to make water the primary drink and set a goal of 64 ounces water daily.       06/27/2022    8:43 AM 05/02/2022    8:05 AM 01/24/2022    8:43 AM  Weight /BMI  Weight 247 lb 1.9 oz 248 lb 274 lb 0.6 oz  Height 5\' 9"  (1.753 m) 5\' 9"  (1.753 m) 5\' 9"  (1.753 m)  BMI 36.49 kg/m2 36.62 kg/m2 40.47 kg/m2    30 pound weight loss in 5 monhts which is very good, change to mounjaro when jardiance completed and commit to daily exercise  Vitamin D deficiency Updated lab needed at/ before next visit.   Skin lesion Topical clotrimaz/betameth twice daily  x 10 days then as needed, if perisits o worsens refer to Dermatology  Hyperuricemia Updated lab needed at/ before next visit.

## 2022-07-01 NOTE — Assessment & Plan Note (Signed)
Topical clotrimaz/betameth twice daily  x 10 days then as needed, if perisits o worsens refer to Dermatology

## 2022-07-01 NOTE — Assessment & Plan Note (Signed)
  Patient re-educated about  the importance of commitment to a  minimum of 150 minutes of exercise per week as able.  The importance of healthy food choices with portion control discussed, as well as eating regularly and within a 12 hour window most days. The need to choose "clean , green" food 50 to 75% of the time is discussed, as well as to make water the primary drink and set a goal of 64 ounces water daily.       06/27/2022    8:43 AM 05/02/2022    8:05 AM 01/24/2022    8:43 AM  Weight /BMI  Weight 247 lb 1.9 oz 248 lb 274 lb 0.6 oz  Height 5\' 9"  (1.753 m) 5\' 9"  (1.753 m) 5\' 9"  (1.753 m)  BMI 36.49 kg/m2 36.62 kg/m2 40.47 kg/m2    30 pound weight loss in 5 monhts which is very good, change to mounjaro when jardiance completed and commit to daily exercise

## 2022-07-16 ENCOUNTER — Ambulatory Visit: Payer: PPO

## 2022-07-20 ENCOUNTER — Other Ambulatory Visit: Payer: Self-pay | Admitting: Family Medicine

## 2022-08-04 ENCOUNTER — Other Ambulatory Visit: Payer: Self-pay | Admitting: Family Medicine

## 2022-08-09 ENCOUNTER — Ambulatory Visit
Admission: RE | Admit: 2022-08-09 | Discharge: 2022-08-09 | Disposition: A | Discharge status: Home / Self Care | Payer: PPO | Source: Ambulatory Visit | Attending: Family Medicine | Admitting: Family Medicine

## 2022-08-09 DIAGNOSIS — Z1231 Encounter for screening mammogram for malignant neoplasm of breast: Secondary | ICD-10-CM | POA: Diagnosis not present

## 2022-08-31 ENCOUNTER — Other Ambulatory Visit: Payer: Self-pay | Admitting: Family Medicine

## 2022-09-06 DIAGNOSIS — I10 Essential (primary) hypertension: Secondary | ICD-10-CM | POA: Diagnosis not present

## 2022-09-06 DIAGNOSIS — E782 Mixed hyperlipidemia: Secondary | ICD-10-CM | POA: Diagnosis not present

## 2022-09-06 DIAGNOSIS — E559 Vitamin D deficiency, unspecified: Secondary | ICD-10-CM | POA: Diagnosis not present

## 2022-09-07 LAB — CBC
Hematocrit: 35.3 % (ref 34.0–46.6)
Hemoglobin: 11.2 g/dL (ref 11.1–15.9)
MCH: 25.1 pg — ABNORMAL LOW (ref 26.6–33.0)
MCHC: 31.7 g/dL (ref 31.5–35.7)
MCV: 79 fL (ref 79–97)
Platelets: 390 10*3/uL (ref 150–450)
RBC: 4.46 x10E6/uL (ref 3.77–5.28)
RDW: 16.1 % — ABNORMAL HIGH (ref 11.7–15.4)
WBC: 6.7 10*3/uL (ref 3.4–10.8)

## 2022-09-07 LAB — CMP14+EGFR
ALT: 9 IU/L (ref 0–32)
AST: 15 IU/L (ref 0–40)
Albumin: 4 g/dL (ref 3.8–4.8)
Alkaline Phosphatase: 56 IU/L (ref 44–121)
BUN/Creatinine Ratio: 8 — ABNORMAL LOW (ref 12–28)
BUN: 6 mg/dL — ABNORMAL LOW (ref 8–27)
Bilirubin Total: 0.4 mg/dL (ref 0.0–1.2)
CO2: 22 mmol/L (ref 20–29)
Calcium: 9.1 mg/dL (ref 8.7–10.3)
Chloride: 105 mmol/L (ref 96–106)
Creatinine, Ser: 0.8 mg/dL (ref 0.57–1.00)
Globulin, Total: 2.9 g/dL (ref 1.5–4.5)
Glucose: 113 mg/dL — ABNORMAL HIGH (ref 70–99)
Potassium: 4 mmol/L (ref 3.5–5.2)
Sodium: 142 mmol/L (ref 134–144)
Total Protein: 6.9 g/dL (ref 6.0–8.5)
eGFR: 76 mL/min/{1.73_m2} (ref >59)

## 2022-09-07 LAB — LIPID PANEL
Chol/HDL Ratio: 4 ratio (ref 0.0–4.4)
Cholesterol, Total: 166 mg/dL (ref 100–199)
HDL: 42 mg/dL (ref >39)
LDL Chol Calc (NIH): 101 mg/dL — ABNORMAL HIGH (ref 0–99)
Triglycerides: 127 mg/dL (ref 0–149)
VLDL Cholesterol Cal: 23 mg/dL (ref 5–40)

## 2022-09-07 LAB — HEMOGLOBIN A1C
Est. average glucose Bld gHb Est-mCnc: 148 mg/dL
Hgb A1c MFr Bld: 6.8 % — ABNORMAL HIGH (ref 4.8–5.6)

## 2022-09-07 LAB — VITAMIN D 25 HYDROXY (VIT D DEFICIENCY, FRACTURES): Vit D, 25-Hydroxy: 41.7 ng/mL (ref 30.0–100.0)

## 2022-09-07 LAB — TSH: TSH: 2.11 u[IU]/mL (ref 0.450–4.500)

## 2022-09-12 ENCOUNTER — Encounter: Payer: Self-pay | Admitting: Family Medicine

## 2022-09-12 ENCOUNTER — Ambulatory Visit (INDEPENDENT_AMBULATORY_CARE_PROVIDER_SITE_OTHER): Payer: PPO | Admitting: Family Medicine

## 2022-09-12 VITALS — BP 122/70 | HR 73 | Ht 69.0 in | Wt 251.1 lb

## 2022-09-12 DIAGNOSIS — Z0001 Encounter for general adult medical examination with abnormal findings: Secondary | ICD-10-CM | POA: Diagnosis not present

## 2022-09-12 DIAGNOSIS — I10 Essential (primary) hypertension: Secondary | ICD-10-CM

## 2022-09-12 DIAGNOSIS — E1140 Type 2 diabetes mellitus with diabetic neuropathy, unspecified: Secondary | ICD-10-CM | POA: Diagnosis not present

## 2022-09-12 DIAGNOSIS — A630 Anogenital (venereal) warts: Secondary | ICD-10-CM

## 2022-09-12 MED ORDER — TIRZEPATIDE 10 MG/0.5ML ~~LOC~~ SOAJ: 10.0000 mg | SUBCUTANEOUS | 2 refills | Status: DC

## 2022-09-12 MED ORDER — CEPHALEXIN 500 MG PO CAPS: 500.0000 mg | ORAL_CAPSULE | Freq: Three times a day (TID) | ORAL | 0 refills | Status: DC

## 2022-09-12 MED ORDER — TIRZEPATIDE 7.5 MG/0.5ML ~~LOC~~ SOAJ: 7.5000 mg | SUBCUTANEOUS | 0 refills | Status: DC

## 2022-09-12 MED ORDER — BISOPROLOL-HYDROCHLOROTHIAZIDE 5-6.25 MG PO TABS: 1.0000 | ORAL_TABLET | Freq: Every day | ORAL | 3 refills | Status: DC

## 2022-09-12 NOTE — Progress Notes (Signed)
Theresa Adkins     MRN: [id number]      DOB: 1945-04-27  Chief Complaint  Patient presents with   Annual Exam    HPI: Patient is in for annual physical exam. C/o persistent anal lesions which drain and are uncomfortable, wants antibiotics, still unsure if she wants to have the definitive management of excision which I have advised over the years. Recent labs,  are reviewed.no med adjustmants needed  Immunization is reviewed , and  needs to be updated if needed.   PE: Pleasant  female, alert and oriented x 3, in no cardio-pulmonary distress. Afebrile. HEENT No facial trauma or asymetry. Sinuses non tender.  Extra occullar muscles intact.. External ears normal, . Neck: supple, no adenopathy,JVD or thyromegaly.No bruits.  Chest: Clear to ascultation bilaterally.No crackles or wheezes. Non tender to palpation  Breast: Normal mammogram 08/09/2022  Cardiovascular system; Heart sounds normal,  S1 and  S2 ,no S3.  No murmur, or thrill.  Peripheral pulses normal.  Abdomen: Soft, non tender,. Anogenital warts increasing in size and number , skin open and bleeding  .   Musculoskeletal exam: Decreased  ROM of spine, hips , shoulders and knees.  No muscle wasting or atrophy.   Neurologic: Cranial nerves 2 to 12 intact. Power, tone ,sensation normal throughout.  disturbance in gait. No tremor.  Skin: Extensive anogenital warts with bleeding Psych; Normal mood and affect. Judgement and concentration normal   Assessment & Plan:   Encounter for Medicare annual examination with abnormal findings Annual exam as documented. Counseling done  re healthy lifestyle involving commitment to 150 minutes exercise per week, heart healthy diet, and attaining healthy weight.The importance of adequate sleep also discussed. Regular seat belt use and home safety, is also discussed. Changes in health habits are decided on by the patient with goals and time frames  set for achieving  them. Immunization and cancer screening needs are specifically addressed at this visit.   Controlled type 2 diabetes with neuropathy [facility]) Theresa Adkins is reminded of the importance of commitment to daily physical activity for 30 minutes or more, as able and the need to limit carbohydrate intake to 30 to 60 grams per meal to help with blood sugar control.   The need to take medication as prescribed, test blood sugar as directed, and to call between visits if there is a concern that blood sugar is uncontrolled is also discussed.   Theresa Adkins is reminded of the importance of daily foot exam, annual eye examination, and good blood sugar, blood pressure and cholesterol control.     Latest Ref Rng & Units 09/06/2022    8:27 AM 04/29/2022    9:30 AM 01/21/2022    9:15 AM 10/26/2021    9:25 AM 10/23/2021   11:11 AM  Diabetic Labs  HbA1c 4.8 - 5.6 % 6.8  6.7  7.5  6.9    6.9    Micro/Creat Ratio 0 - 29 mg/g creat  18      Chol 100 - 199 mg/dL 409   811   914   HDL >78 mg/dL 42   42   50   Calc LDL 0 - 99 mg/dL 295   91   75   Triglycerides 0 - 149 mg/dL 621   308   657   Creatinine 0.57 - 1.00 mg/dL 8.46  9.62  9.52   8.41       09/12/2022    8:34 AM 06/27/2022  8:43 AM 05/02/2022    8:05 AM 01/24/2022    9:32 AM 01/24/2022    8:43 AM 11/30/2021    9:08 AM 11/30/2021    8:40 AM  BP/Weight  Systolic BP 122 136 125 128 137 152 138  Diastolic BP 70 76 65 70 75 80 82  Wt. (Lbs) 251.12 247.12 248  274.04  275  BMI 37.08 kg/m2 36.49 kg/m2 36.62 kg/m2  40.47 kg/m2  40.61 kg/m2      09/12/2022    8:20 AM 06/20/2021    8:40 AM  Foot/eye exam completion dates  Foot Form Completion Done Done      Increase mounjaro dose over next 3 months  Anogenital warts Worsening , spoke at length re need to have excision, keflex x 1 week prescribed referral will be entered once pt confirms she will folow through with medical recommendations, she has already been evaluated by general surgery and Surgeon is   available once pt decides   Morbid obesity [facility])  Patient re-educated about  the importance of commitment to a  minimum of 150 minutes of exercise per week as able.  The importance of healthy food choices with portion control discussed, as well as eating regularly and within a 12 hour window most days. The need to choose "clean , green" food 50 to 75% of the time is discussed, as well as to make water the primary drink and set a goal of 64 ounces water daily.       09/12/2022    8:34 AM 06/27/2022    8:43 AM 05/02/2022    8:05 AM  Weight /BMI  Weight 251 lb 1.9 oz 247 lb 1.9 oz 248 lb  Height 5\' 9"  (1.753 m) 5\' 9"  (1.753 m) 5\' 9"  (1.753 m)  BMI 37.08 kg/m2 36.49 kg/m2 36.62 kg/m2    Deteriorated , poor food choice though is eating less, will change also mounjaro dose increased  HTN (hypertension) Controlled, no change in medication Will chang tab prescribed to lower dose as currently breaking ziac prescribed DASH diet and commitment to daily physical activity for a minimum of 30 minutes discussed and encouraged, as a part of hypertension management. The importance of attaining a healthy weight is also discussed.     09/12/2022    8:34 AM 06/27/2022    8:43 AM 05/02/2022    8:05 AM 01/24/2022    9:32 AM 01/24/2022    8:43 AM 11/30/2021    9:08 AM 11/30/2021    8:40 AM  BP/Weight  Systolic BP 122 136 125 128 137 152 138  Diastolic BP 70 76 65 70 75 80 82  Wt. (Lbs) 251.12 247.12 248  274.04  275  BMI 37.08 kg/m2 36.49 kg/m2 36.62 kg/m2  40.47 kg/m2  40.61 kg/m2

## 2022-09-12 NOTE — Patient Instructions (Addendum)
F/U in 14 weeks, call if you need me sooner  Keep wellness appt please  Keflex an antibiotic is prescribed for 1 week, and I am referring you to a surgeon regarding the warts/ skin lesions  Mounjaro dose is being increased to 10 mg weekly in next 2 months, and you need to reduce sweets and poor food choices  Non fasting chem 7 and EGFr and HBA1C 3 to 5 days before next visit  Bisoprolol/ hydrochlorothiazide tablet is sent in at the lower dose , so you do not have to break tablet in half anymore , when you next fill  Need shingrix #2 to complete shingles vaccination series  It is important that you exercise regularly at least 30 minutes 5 times a week. If you develop chest pain, have severe difficulty breathing, or feel very tired, stop exercising immediately and seek medical attention  Think about what you will eat, plan ahead. Choose " clean, green, fresh or frozen" over canned, processed or packaged foods which are more sugary, salty and fatty. 70 to 75% of food eaten should be vegetables and fruit. Three meals at set times with snacks allowed between meals, but they must be fruit or vegetables. Aim to eat over a 12 hour period , example 7 am to 7 pm, and STOP after  your last meal of the day. Drink water,generally about 64 ounces per day, no other drink is as healthy. Fruit juice is best enjoyed in a healthy way, by EATING the fruit.' Thanks for choosing [facility], we consider it a privelige to serve you.

## 2022-09-13 ENCOUNTER — Telehealth: Payer: Self-pay | Admitting: Family Medicine

## 2022-09-13 ENCOUNTER — Encounter: Payer: Self-pay | Admitting: Family Medicine

## 2022-09-13 DIAGNOSIS — Z0001 Encounter for general adult medical examination with abnormal findings: Secondary | ICD-10-CM | POA: Insufficient documentation

## 2022-09-13 DIAGNOSIS — A630 Anogenital (venereal) warts: Secondary | ICD-10-CM

## 2022-09-13 DIAGNOSIS — Z23 Encounter for immunization: Secondary | ICD-10-CM | POA: Insufficient documentation

## 2022-09-13 NOTE — Assessment & Plan Note (Signed)
Worsening , spoke at length re need to have excision, keflex x 1 week prescribed referral will be entered once pt confirms she will folow through with medical recommendations, she has already been evaluated by general surgery and Surgeon is  available once pt decides

## 2022-09-13 NOTE — Assessment & Plan Note (Signed)

## 2022-09-13 NOTE — Assessment & Plan Note (Signed)
Theresa Adkins is reminded of the importance of commitment to daily physical activity for 30 minutes or more, as able and the need to limit carbohydrate intake to 30 to 60 grams per meal to help with blood sugar control.   The need to take medication as prescribed, test blood sugar as directed, and to call between visits if there is a concern that blood sugar is uncontrolled is also discussed.   Theresa Adkins is reminded of the importance of daily foot exam, annual eye examination, and good blood sugar, blood pressure and cholesterol control.     Latest Ref Rng & Units 09/06/2022    8:27 AM 04/29/2022    9:30 AM 01/21/2022    9:15 AM 10/26/2021    9:25 AM 10/23/2021   11:11 AM  Diabetic Labs  HbA1c 4.8 - 5.6 % 6.8  6.7  7.5  6.9    6.9    Micro/Creat Ratio 0 - 29 mg/g creat  18      Chol 100 - 199 mg/dL 119   147   829   HDL >56 mg/dL 42   42   50   Calc LDL 0 - 99 mg/dL 213   91   75   Triglycerides 0 - 149 mg/dL 086   578   469   Creatinine 0.57 - 1.00 mg/dL 6.29  5.28  4.13   2.44       09/12/2022    8:34 AM 06/27/2022    8:43 AM 05/02/2022    8:05 AM 01/24/2022    9:32 AM 01/24/2022    8:43 AM 11/30/2021    9:08 AM 11/30/2021    8:40 AM  BP/Weight  Systolic BP 122 136 125 128 137 152 138  Diastolic BP 70 76 65 70 75 80 82  Wt. (Lbs) 251.12 247.12 248  274.04  275  BMI 37.08 kg/m2 36.49 kg/m2 36.62 kg/m2  40.47 kg/m2  40.61 kg/m2      09/12/2022    8:20 AM 06/20/2021    8:40 AM  Foot/eye exam completion dates  Foot Form Completion Done Done      Increase mounjaro dose over next 3 months

## 2022-09-13 NOTE — Assessment & Plan Note (Signed)
  Patient re-educated about  the importance of commitment to a  minimum of 150 minutes of exercise per week as able.  The importance of healthy food choices with portion control discussed, as well as eating regularly and within a 12 hour window most days. The need to choose "clean , green" food 50 to 75% of the time is discussed, as well as to make water the primary drink and set a goal of 64 ounces water daily.       09/12/2022    8:34 AM 06/27/2022    8:43 AM 05/02/2022    8:05 AM  Weight /BMI  Weight 251 lb 1.9 oz 247 lb 1.9 oz 248 lb  Height 5\' 9"  (1.753 m) 5\' 9"  (1.753 m) 5\' 9"  (1.753 m)  BMI 37.08 kg/m2 36.49 kg/m2 36.62 kg/m2    Deteriorated , poor food choice though is eating less, will change also mounjaro dose increased

## 2022-09-13 NOTE — Assessment & Plan Note (Signed)
Controlled, no change in medication Will chang tab prescribed to lower dose as currently breaking ziac prescribed DASH diet and commitment to daily physical activity for a minimum of 30 minutes discussed and encouraged, as a part of hypertension management. The importance of attaining a healthy weight is also discussed.     09/12/2022    8:34 AM 06/27/2022    8:43 AM 05/02/2022    8:05 AM 01/24/2022    9:32 AM 01/24/2022    8:43 AM 11/30/2021    9:08 AM 11/30/2021    8:40 AM  BP/Weight  Systolic BP 122 136 125 128 137 152 138  Diastolic BP 70 76 65 70 75 80 82  Wt. (Lbs) 251.12 247.12 248  274.04  275  BMI 37.08 kg/m2 36.49 kg/m2 36.62 kg/m2  40.47 kg/m2  40.61 kg/m2

## 2022-09-13 NOTE — Telephone Encounter (Signed)
Pls call pt and flet her know I reviewed the note from the surgeon who she was referred to last year re warts, Dr recommends excision to cure her problem which I STRONGLY recommend  If she does agree that she is ready and will keep the appt I will refer her to the same Doctor, pls let me know. If she has another option eg to see another  Surgeon, let me know, thanks

## 2022-09-15 ENCOUNTER — Other Ambulatory Visit: Payer: Self-pay | Admitting: Family Medicine

## 2022-09-15 DIAGNOSIS — E785 Hyperlipidemia, unspecified: Secondary | ICD-10-CM

## 2022-09-23 NOTE — Addendum Note (Signed)
Addended by: Kerri Perches on: 09/23/2022 11:34 AM   Modules accepted: Orders

## 2022-09-24 ENCOUNTER — Other Ambulatory Visit: Payer: Self-pay | Admitting: Family Medicine

## 2022-10-20 ENCOUNTER — Other Ambulatory Visit: Payer: Self-pay | Admitting: Family Medicine

## 2022-11-13 ENCOUNTER — Encounter: Payer: Self-pay | Admitting: Pharmacist

## 2022-11-13 NOTE — Progress Notes (Signed)
Pharmacy Quality Measure Review  This patient is appearing on a report for being at risk of failing the adherence measure for diabetes medications this calendar year.   Medication: Mounjaro 7.5 mg weekly. Last fill date: 09/12/22.   Contacted patient. Notes she was not losing weight so she stopped the medication due to the cost. Discussed restarting metformin, patient was not interested at this time as she didn't feel like it helped her glucose control and has read negative things online about metformin. Notes she would like to go back on Jardiance, as it was previously more cost effective. She wants to talk to Dr. Lodema Hong about this at her appointment in late October.   Discussed income cut offs for patient assistance programs. She is near the income cut off for Jardiance. She would qualify for Ozempic assistance, but is not interested in a different GLP1 since she was not losing weight with low dose Mounjaro. Discussed that higher doses generally provide greater weight reduction.   She has not been checking blood sugar readings lately.   Will notify PCP.   Catie Eppie Gibson, PharmD, BCACP, CPP Clinical Pharmacist [facility] [phone]

## 2022-11-25 ENCOUNTER — Ambulatory Visit (INDEPENDENT_AMBULATORY_CARE_PROVIDER_SITE_OTHER): Payer: PPO

## 2022-11-25 VITALS — Ht 69.0 in | Wt 250.0 lb

## 2022-11-25 DIAGNOSIS — Z0000 Encounter for general adult medical examination without abnormal findings: Secondary | ICD-10-CM

## 2022-11-25 DIAGNOSIS — Z0100 Encounter for examination of eyes and vision without abnormal findings: Secondary | ICD-10-CM

## 2022-11-25 DIAGNOSIS — E1140 Type 2 diabetes mellitus with diabetic neuropathy, unspecified: Secondary | ICD-10-CM

## 2022-11-25 DIAGNOSIS — Z780 Asymptomatic menopausal state: Secondary | ICD-10-CM

## 2022-11-25 NOTE — Patient Instructions (Signed)
Theresa Adkins , Thank you for taking time to come for your Medicare Wellness Visit. I appreciate your ongoing commitment to your health goals. Please review the following plan we discussed and let me know if I can assist you in the future.   Referrals/Orders/Follow-Ups/Clinician Recommendations:  Next Medicare Annual Wellness Visit: November 26, 2023 at 1:10 pm virtual visit  You've been referred to see [facility] for a diabetic eye exam.If you haven't heard from them in 7 business days, please call the number below to schedule your appointment.  [facility] [address] [place] [address] PHONE: [phone]   You have an order for:  []   2D Mammogram  []   3D Mammogram  [x]   Bone Density   []   Lung Cancer Screening  Please call for appointment:   [facility] [address] [place], [place] [address] [phone]  Make sure to wear two-piece clothing.  No lotions powders or deodorants the day of the appointment Make sure to bring picture ID and insurance card.  Bring list of medications you are currently taking including any supplements.   Schedule your [place] screening mammogram through MyChart!   Log into your MyChart account.  Go to 'Visit' (or 'Appointments' if on mobile App) --> Schedule an Appointment  Under 'Select a Reason for Visit' choose the Mammogram Screening option.  Complete the pre-visit questions and select the time and place that best fits your schedule.    This is a list of the screening recommended for you and due dates:  Health Maintenance  Topic Date Due   DEXA scan (bone density measurement)  05/05/2019   Eye exam for diabetics  08/15/2021   Flu Shot  09/19/2022   Zoster (Shingles) Vaccine (2 of 2) 12/14/2022*   Hemoglobin A1C  03/09/2023   Yearly kidney health urinalysis for diabetes  04/29/2023   Mammogram  08/09/2023   Yearly kidney function blood test for diabetes  09/06/2023    Complete foot exam   09/12/2023   Medicare Annual Wellness Visit  11/25/2023   DTaP/Tdap/Td vaccine (3 - Td or Tdap) 09/11/2030   Pneumonia Vaccine  Completed   Hepatitis C Screening  Completed   HPV Vaccine  Aged Out   Colon Cancer Screening  Discontinued   COVID-19 Vaccine  Discontinued  *Topic was postponed. The date shown is not the original due date.    Advanced directives: (Declined) Advance directive discussed with you today. Even though you declined this today, please call our office should you change your mind, and we can give you the proper paperwork for you to fill out.  Next Medicare Annual Wellness Visit scheduled for next year: Yes  Preventive Care 77 Years and Older, Female Preventive care refers to lifestyle choices and visits with your health care provider that can promote health and wellness. Preventive care visits are also called wellness exams. What can I expect for my preventive care visit? Counseling Your health care provider may ask you questions about your: Medical history, including: Past medical problems. Family medical history. Pregnancy and menstrual history. History of falls. Current health, including: Memory and ability to understand (cognition). Emotional well-being. Home life and relationship well-being. Sexual activity and sexual health. Lifestyle, including: Alcohol, nicotine or tobacco, and drug use. Access to firearms. Diet, exercise, and sleep habits. Work and work Astronomer. Sunscreen use. Safety issues such as seatbelt and bike helmet use. Physical exam Your health care provider will check  your: Height and weight. These may be used to calculate your BMI (body mass index). BMI is a measurement that tells if you are at a healthy weight. Waist circumference. This measures the distance around your waistline. This measurement also tells if you are at a healthy weight and may help predict your risk of certain diseases, such as type 2 diabetes  and high blood pressure. Heart rate and blood pressure. Body temperature. Skin for abnormal spots. What immunizations do I need?  Vaccines are usually given at various ages, according to a schedule. Your health care provider will recommend vaccines for you based on your age, medical history, and lifestyle or other factors, such as travel or where you work. What tests do I need? Screening Your health care provider may recommend screening tests for certain conditions. This may include: Lipid and cholesterol levels. Hepatitis C test. Hepatitis B test. HIV (human immunodeficiency virus) test. STI (sexually transmitted infection) testing, if you are at risk. Lung cancer screening. Colorectal cancer screening. Diabetes screening. This is done by checking your blood sugar (glucose) after you have not eaten for a while (fasting). Mammogram. Talk with your health care provider about how often you should have regular mammograms. BRCA-related cancer screening. This may be done if you have a family history of breast, ovarian, tubal, or peritoneal cancers. Bone density scan. This is done to screen for osteoporosis. Talk with your health care provider about your test results, treatment options, and if necessary, the need for more tests. Follow these instructions at home: Eating and drinking  Eat a diet that includes fresh fruits and vegetables, whole grains, lean protein, and low-fat dairy products. Limit your intake of foods with high amounts of sugar, saturated fats, and salt. Take vitamin and mineral supplements as recommended by your health care provider. Do not drink alcohol if your health care provider tells you not to drink. If you drink alcohol: Limit how much you have to 0-1 drink a day. Know how much alcohol is in your drink. In the [place] one drink equals one 12 oz bottle of beer (355 mL), one 5 oz glass of wine (148 mL), or one 1 oz glass of hard liquor (44 mL). Lifestyle Brush your  teeth every morning and night with fluoride toothpaste. Floss one time each day. Exercise for at least 30 minutes 5 or more days each week. Do not use any products that contain nicotine or tobacco. These products include cigarettes, chewing tobacco, and vaping devices, such as e-cigarettes. If you need help quitting, ask your health care provider. Do not use drugs. If you are sexually active, practice safe sex. Use a condom or other form of protection in order to prevent STIs. Take aspirin only as told by your health care provider. Make sure that you understand how much to take and what form to take. Work with your health care provider to find out whether it is safe and beneficial for you to take aspirin daily. Ask your health care provider if you need to take a cholesterol-lowering medicine (statin). Find healthy ways to manage stress, such as: Meditation, yoga, or listening to music. Journaling. Talking to a trusted person. Spending time with friends and family. Minimize exposure to UV radiation to reduce your risk of skin cancer. Safety Always wear your seat belt while driving or riding in a vehicle. Do not drive: If you have been drinking alcohol. Do not ride with someone who has been drinking. When you are tired or distracted. While texting.  If you have been using any mind-altering substances or drugs. Wear a helmet and other protective equipment during sports activities. If you have firearms in your house, make sure you follow all gun safety procedures. What's next? Visit your health care provider once a year for an annual wellness visit. Ask your health care provider how often you should have your eyes and teeth checked. Stay up to date on all vaccines. This information is not intended to replace advice given to you by your health care provider. Make sure you discuss any questions you have with your health care provider. Document Revised: 08/02/2020 Document Reviewed:  08/02/2020 Elsevier Patient Education  2024 [employer]. Understanding Your Risk for Falls Millions of people have serious injuries from falls each year. It is important to understand your risk of falling. Talk with your health care provider about your risk and what you can do to lower it. If you do have a serious fall, make sure to tell your provider. Falling once raises your risk of falling again. How can falls affect me? Serious injuries from falls are common. These include: Broken bones, such as hip fractures. Head injuries, such as traumatic brain injuries (TBI) or concussions. A fear of falling can cause you to avoid activities and stay at home. This can make your muscles weaker and raise your risk for a fall. What can increase my risk? There are a number of risk factors that increase your risk for falling. The more risk factors you have, the higher your risk of falling. Serious injuries from a fall happen most often to people who are older than 77 years old. Teenagers and young adults ages 55-29 are also at higher risk. Common risk factors include: Weakness in the lower body. Being generally weak or confused due to long-term (chronic) illness. Dizziness or balance problems. Poor vision. Medicines that cause dizziness or drowsiness. These may include: Medicines for your blood pressure, heart, anxiety, insomnia, or swelling (edema). Pain medicines. Muscle relaxants. Other risk factors include: Drinking alcohol. Having had a fall in the past. Having foot pain or wearing improper footwear. Working at a dangerous job. Having any of the following in your home: Tripping hazards, such as floor clutter or loose rugs. Poor lighting. Pets. Having dementia or memory loss. What actions can I take to lower my risk of falling?     Physical activity Stay physically fit. Do strength and balance exercises. Consider taking a regular class to build strength and balance. Yoga and tai chi are  good options. Vision Have your eyes checked every year and your prescription for glasses or contacts updated as needed. Shoes and walking aids Wear non-skid shoes. Wear shoes that have rubber soles and low heels. Do not wear high heels. Do not walk around the house in socks or slippers. Use a cane or walker as told by your provider. Home safety Attach secure railings on both sides of your stairs. Install grab bars for your bathtub, shower, and toilet. Use a non-skid mat in your bathtub or shower. Attach bath mats securely with double-sided, non-slip rug tape. Use good lighting in all rooms. Keep a flashlight near your bed. Make sure there is a clear path from your bed to the bathroom. Use night-lights. Do not use throw rugs. Make sure all carpeting is taped or tacked down securely. Remove all clutter from walkways and stairways, including extension cords. Repair uneven or broken steps and floors. Avoid walking on icy or slippery surfaces. Walk on the grass instead  of on icy or slick sidewalks. Use ice melter to get rid of ice on walkways in the winter. Use a cordless phone. Questions to ask your health care provider Can you help me check my risk for a fall? Do any of my medicines make me more likely to fall? Should I take a vitamin D supplement? What exercises can I do to improve my strength and balance? Should I make an appointment to have my vision checked? Do I need a bone density test to check for weak bones (osteoporosis)? Would it help to use a cane or a walker? Where to find more information Centers for Disease Control and Prevention, [employer]: TonerPromos.no Community-Based Fall Prevention Programs: TonerPromos.no [employer] on Aging: BaseRingTones.pl Contact a health care provider if: You fall at home. You are afraid of falling at home. You feel weak, drowsy, or dizzy. This information is not intended to replace advice given to you by your health care provider. Make sure you discuss  any questions you have with your health care provider. Document Revised: 10/08/2021 Document Reviewed: 10/08/2021 Elsevier Patient Education  2024 [employer].

## 2022-11-25 NOTE — Progress Notes (Signed)
Because this visit was a virtual/telehealth visit,  certain criteria was not obtained, such a blood pressure, CBG if applicable, and timed get up and go. Any medications not marked as "taking" were not mentioned during the medication reconciliation part of the visit. Any vitals not documented were not able to be obtained due to this being a telehealth visit or patient was unable to self-report a recent blood pressure reading due to a lack of equipment at home via telehealth. Vitals that have been documented are verbally provided by the patient.   Subjective:   Theresa Adkins is a 77 y.o. female who presents for Medicare Annual (Subsequent) preventive examination.  Visit Complete: Virtual  I connected with  Theresa Adkins on 11/25/22 by a audio enabled telemedicine application and verified that I am speaking with the correct person using two identifiers.  Patient Location: Home  Provider Location: Home Office  I discussed the limitations of evaluation and management by telemedicine. The patient expressed understanding and agreed to proceed.  Patient Medicare AWV questionnaire was completed by the patient on na; I have confirmed that all information answered by patient is correct and no changes since this date.  Cardiac Risk Factors include: advanced age (>72men, >65 women);diabetes mellitus;dyslipidemia;hypertension;sedentary lifestyle;obesity (BMI >30kg/m2)     Objective:    Today's Vitals   11/25/22 1419  Weight: 250 lb (113.4 kg)  Height: 5\' 9"  (1.753 m)   Body mass index is 36.92 kg/m.     11/25/2022    2:19 PM 11/08/2021    3:05 PM 10/03/2020    9:29 AM 09/28/2019    8:50 AM 11/27/2018    7:43 AM 09/22/2017    9:31 AM 06/24/2016    9:32 AM  Advanced Directives  Does Patient Have a Medical Advance Directive? No No No No No No No  Would patient like information on creating a medical advance directive? No - Patient declined No - Patient declined No - Patient declined No - Patient  declined No - Patient declined Yes (ED - Information included in AVS) No - Patient declined    Current Medications (verified) Outpatient Encounter Medications as of 11/25/2022  Medication Sig   amLODipine (NORVASC) 10 MG tablet Take 1 tablet by mouth once daily   aspirin 325 MG EC tablet Take 325 mg by mouth daily. Pt takes 1 daily   atorvastatin (LIPITOR) 10 MG tablet Take 1 tablet by mouth once daily   bisoprolol-hydrochlorothiazide (ZIAC) 5-6.25 MG tablet Take 1 tablet by mouth daily.   cephALEXin (KEFLEX) 500 MG capsule Take 1 capsule (500 mg total) by mouth 3 (three) times daily.   Cholecalciferol (VITAMIN D3) 50 MCG (2000 UT) CHEW Chew 4,000 Units by mouth daily.    fenofibrate (TRICOR) 48 MG tablet Take 1 tablet by mouth once daily   ferrous sulfate 325 (65 FE) MG tablet Take 325 mg by mouth daily with breakfast.   gabapentin (NEURONTIN) 100 MG capsule Take one capsule by mouth at bedtime as needed, for arm pain   Multiple Vitamin (MULTIVITAMIN) tablet Take 1 tablet by mouth daily.   tirzepatide (MOUNJARO) 10 MG/0.5ML Pen Inject 10 mg into the skin once a week.   tirzepatide (MOUNJARO) 7.5 MG/0.5ML Pen Inject 7.5 mg into the skin once a week.   UNABLE TO FIND Diabetic shoes x 1  Inserts x 3 pair  Dx e11.9   valsartan (DIOVAN) 80 MG tablet Take 1 tablet by mouth once daily   glucose blood (RELION PREMIER TEST) test  strip Use as instructed once daily dx e11.9 (Patient not taking: Reported on 09/12/2022)   No facility-administered encounter medications on file as of 11/25/2022.    Allergies (verified) Ace inhibitors   History: Past Medical History:  Diagnosis Date   Allergic rhinitis, seasonal    Arthritis    Breast cancer [facility]) dx 02/ 2015---  oncologist-  dr shadad/ dr Mitzi Hansen   dx Right breast upper-outer quadrant DCIS, high grade (Tis N0), ER+, PR negative--- 07-01-2013  s/p  right breast lumpectomy w/ sln bxs and radiation therapy (08-24-2013 to 09-20-2013)   Diabetes  mellitus type II    Diabetic neuropathy (HCC)    Heart murmur    Hidradenitis axillaris    chronic   History of adenomatous polyp of colon    03-04-2008  tubular adenoma   History of radiation therapy 08-24-2013 to 09-20-2013   total 50Gy   Hyperlipidemia    Hypertension    Personal history of radiation therapy    PMB (postmenopausal bleeding) 05/14/2016   Thickened endometrium    Wears dentures    upper   Wears glasses    Past Surgical History:  Procedure Laterality Date   BREAST BIOPSY Right 04/16/2016   BREAST LUMPECTOMY Right 08/2013   BREAST LUMPECTOMY WITH NEEDLE LOCALIZATION AND AXILLARY SENTINEL LYMPH NODE BX Right 07/01/2013   Procedure: BREAST LUMPECTOMY WITH NEEDLE LOCALIZATION AND AXILLARY SENTINEL LYMPH NODE BX;  Surgeon: Robyne Askew, MD;  Location: [facility] OR;  Service: General;  Laterality: Right;   BREAST SURGERY  1970s   benign tumor removed from unspecified breast    COLONOSCOPY N/A 11/27/2018   Procedure: COLONOSCOPY;  Surgeon: West Bali, MD;  Location: [facility];  Service: Endoscopy;  Laterality: N/A;  8:30   COLONOSCOPY W/ POLYPECTOMY  03/04/2008   DILATATION & CURRETTAGE/HYSTEROSCOPY WITH RESECTOCOPE N/A 05/14/2016   Procedure: DILATATION & CURETTAGE/HYSTEROSCOPY WITH RESECTOCOPE;  Surgeon: Hal Morales, MD;  Location: [facility];  Service: Gynecology;  Laterality: N/A;   POLYPECTOMY  11/27/2018   Procedure: POLYPECTOMY;  Surgeon: West Bali, MD;  Location: [facility];  Service: Endoscopy;;  colon   Family History  Problem Relation Age of Onset   Cancer Brother 38       bone    Obesity Sister    Heart disease Mother    Cancer Sister 75       leukemia    Obesity Sister    Social History   Socioeconomic History   Marital status: Divorced    Spouse name: Not on file   Number of children: 1   Years of education: Not on file   Highest education level: Not on file  Occupational History   Occupation: Employed    Tobacco Use   Smoking status: Former    Current packs/day: 0.00    Average packs/day: 1 pack/day for 25.0 years (25.0 ttl pk-yrs)    Types: Cigarettes    Start date: 02/17/1977    Quit date: 02/17/2002    Years since quitting: 20.7   Smokeless tobacco: Never  Substance and Sexual Activity   Alcohol use: No   Drug use: No   Sexual activity: Never  Other Topics Concern   Not on file  Social History Narrative   Not on file   Social Determinants of Health   Financial Resource Strain: Low Risk  (11/25/2022)   Overall Financial Resource Strain (CARDIA)    Difficulty of Paying Living Expenses: Not hard at  all  Food Insecurity: No Food Insecurity (11/25/2022)   Hunger Vital Sign    Worried About Running Out of Food in the Last Year: Never true    Ran Out of Food in the Last Year: Never true  Transportation Needs: No Transportation Needs (11/25/2022)   PRAPARE - Administrator, Civil Service (Medical): No    Lack of Transportation (Non-Medical): No  Physical Activity: Insufficiently Active (11/25/2022)   Exercise Vital Sign    Days of Exercise per Week: 2 days    Minutes of Exercise per Session: 20 min  Stress: No Stress Concern Present (11/25/2022)   [employer] of Occupational Health - Occupational Stress Questionnaire    Feeling of Stress : Not at all  Social Connections: Socially Isolated (11/25/2022)   Social Connection and Isolation Panel [NHANES]    Frequency of Communication with Friends and Family: More than three times a week    Frequency of Social Gatherings with Friends and Family: More than three times a week    Attends Religious Services: Never    Database administrator or Organizations: No    Attends Engineer, structural: Never    Marital Status: Divorced    Tobacco Counseling Counseling given: Yes   Clinical Intake:  Pre-visit preparation completed: Yes  Pain : No/denies pain     BMI - recorded: 36.92 Nutritional Status: BMI >  30  Obese Nutritional Risks: None Diabetes: Yes CBG done?: No (telehealth visit) Did pt. bring in CBG monitor from home?: No  How often do you need to have someone help you when you read instructions, pamphlets, or other written materials from your doctor or pharmacy?: 1 - Never  Interpreter Needed?: No  Information entered by :: Abby Fredrica Capano, CMA   Activities of Daily Living    11/25/2022    2:30 PM  In your present state of health, do you have any difficulty performing the following activities:  Hearing? 0  Vision? 0  Difficulty concentrating or making decisions? 0  Walking or climbing stairs? 0  Dressing or bathing? 0  Doing errands, shopping? 0  Preparing Food and eating ? N  Using the Toilet? N  In the past six months, have you accidently leaked urine? N  Do you have problems with loss of bowel control? N  Managing your Medications? N  Managing your Finances? N  Housekeeping or managing your Housekeeping? N    Patient Care Team: Kerri Perches, MD as PCP - General (Family Medicine) Rothbart, Gerrit Friends, MD (Inactive) (Cardiology) Griselda Miner, MD as Consulting Physician (General Surgery) Jethro Bolus, MD as Consulting Physician (Ophthalmology)  Indicate any recent Medical Services you may have received from other than [facility] providers in the past year (date may be approximate).     Assessment:   This is a routine wellness examination for Theresa Adkins.  Hearing/Vision screen Hearing Screening - Comments:: Patient denies any hearing difficulties.   Vision Screening - Comments:: Referral placed for patient to establish with a new eye care provider because her previous provider retired.    Goals Addressed             This Visit's Progress    Patient Stated       Patient states she would like to travel around the [place]        Depression Screen    11/25/2022    2:23 PM 09/12/2022    8:37 AM 06/27/2022    8:44 AM 05/02/2022  8:07 AM 01/24/2022     8:45 AM 11/30/2021    8:39 AM 11/08/2021    3:06 PM  PHQ 2/9 Scores  PHQ - 2 Score 0 1 0 0 0 0 1  PHQ- 9 Score 0 3 1 1        Fall Risk    09/12/2022    8:37 AM 06/27/2022    8:43 AM 05/02/2022    8:07 AM 01/24/2022    8:45 AM 11/30/2021    8:38 AM  Fall Risk   Falls in the past year? 1 0 1 1 0  Number falls in past yr: 0 0 0 1 0  Injury with Fall? 0 0 0 0 0  Risk for fall due to : History of fall(s) History of fall(s) No Fall Risks History of fall(s) No Fall Risks  Follow up Falls evaluation completed Falls evaluation completed Falls evaluation completed Falls evaluation completed     MEDICARE RISK AT HOME: Medicare Risk at Home Any stairs in or around the home?: No If so, are there any without handrails?: No Home free of loose throw rugs in walkways, pet beds, electrical cords, etc?: Yes Adequate lighting in your home to reduce risk of falls?: Yes Life alert?: No Use of a cane, walker or w/c?: No Grab bars in the bathroom?: No Shower chair or bench in shower?: No Elevated toilet seat or a handicapped toilet?: No  TIMED UP AND GO:  Was the test performed?  No    Cognitive Function:    11/08/2021    3:07 PM 04/28/2014    8:06 AM  MMSE - Mini Mental State Exam  Not completed: Unable to complete   Orientation to time  5  Orientation to Place  5  Registration  3  Attention/ Calculation  5  Recall  3  Language- name 2 objects  2  Language- repeat  1  Language- follow 3 step command  3  Language- read & follow direction  1  Write a sentence  1  Copy design  1  Total score  30        11/25/2022    2:22 PM 11/08/2021    3:07 PM 10/03/2020    9:37 AM 09/28/2019    8:52 AM 09/25/2018    9:52 AM  6CIT Screen  What Year? 0 points 0 points 0 points 0 points 0 points  What month? 0 points 0 points 0 points 0 points 0 points  What time? 0 points 0 points 0 points 0 points 0 points  Count back from 20 0 points 0 points 0 points 0 points 0 points  Months in reverse 0 points 0  points  0 points 0 points  Repeat phrase 0 points 0 points  0 points 0 points  Total Score 0 points 0 points  0 points 0 points    Immunizations Immunization History  Administered Date(s) Administered   Fluad Quad(high Dose 65+) 11/08/2019, 12/11/2020, 11/30/2021   Influenza Split 01/13/2012, 11/10/2018   Influenza,inj,Quad PF,6+ Mos 11/13/2012, 11/25/2013, 11/01/2014, 12/21/2015, 12/03/2016, 12/05/2017   Influenza-Unspecified 12/21/2015   PFIZER(Purple Top)SARS-COV-2 Vaccination 04/11/2019, 05/05/2019, 01/24/2020   Pneumococcal Conjugate-13 12/29/2014   Pneumococcal Polysaccharide-23 01/19/2016   Pneumococcal-Unspecified 01/19/2016   Tdap 06/05/2010, 09/10/2020   Zoster Recombinant(Shingrix) 06/22/2021   Zoster, Live 06/05/2010   Zoster, Unspecified 06/05/2010    TDAP status: Up to date  Flu Vaccine status: Due, Education has been provided regarding the importance of this vaccine. Advised may receive this vaccine  at local pharmacy or Health Dept. Aware to provide a copy of the vaccination record if obtained from local pharmacy or Health Dept. Verbalized acceptance and understanding.  Pneumococcal vaccine status: Up to date  Covid-19 vaccine status: Completed vaccines  Qualifies for Shingles Vaccine? Yes   Zostavax completed Yes   Shingrix Completed?: No.    Education has been provided regarding the importance of this vaccine. Patient has been advised to call insurance company to determine out of pocket expense if they have not yet received this vaccine. Advised may also receive vaccine at local pharmacy or Health Dept. Verbalized acceptance and understanding.  Screening Tests Health Maintenance  Topic Date Due   OPHTHALMOLOGY EXAM  08/15/2021   INFLUENZA VACCINE  09/19/2022   Zoster Vaccines- Shingrix (2 of 2) 12/14/2022 (Originally 08/17/2021)   HEMOGLOBIN A1C  03/09/2023   Diabetic kidney evaluation - Urine ACR  04/29/2023   Diabetic kidney evaluation - eGFR measurement   09/06/2023   FOOT EXAM  09/12/2023   Medicare Annual Wellness (AWV)  09/13/2023   DTaP/Tdap/Td (3 - Td or Tdap) 09/11/2030   Pneumonia Vaccine 33+ Years old  Completed   DEXA SCAN  Completed   Hepatitis C Screening  Completed   HPV VACCINES  Aged Out   Colonoscopy  Discontinued   COVID-19 Vaccine  Discontinued    Health Maintenance  Health Maintenance Due  Topic Date Due   OPHTHALMOLOGY EXAM  08/15/2021   INFLUENZA VACCINE  09/19/2022    Colorectal cancer screening: No longer required.   Mammogram status: Completed 07/20/2022. Repeat every year  Bone Density status: Ordered 11/25/22. Pt provided with contact info and advised to call to schedule appt.  Lung Cancer Screening: (Low Dose CT Chest recommended if Age 3-80 years, 20 pack-year currently smoking OR have quit w/in 15years.) does not qualify.   Lung Cancer Screening Referral: na  Additional Screening:  Hepatitis C Screening: does not qualify; Completed 04/26/2015  Vision Screening: Recommended annual ophthalmology exams for early detection of glaucoma and other disorders of the eye. Is the patient up to date with their annual eye exam?  Yes  Who is the provider or what is the name of the office in which the patient attends annual eye exams? Previous doctor retired. Referral placed so that patient can establish with a new provider If pt is not established with a provider, would they like to be referred to a provider to establish care? Yes .   Dental Screening: Recommended annual dental exams for proper oral hygiene  Diabetic Foot Exam: Diabetic Foot Exam: Completed 09/12/2022  Community Resource Referral / Chronic Care Management: CRR required this visit?  No   CCM required this visit?  No     Plan:     I have personally reviewed and noted the following in the patient's chart:   Medical and social history Use of alcohol, tobacco or illicit drugs  Current medications and supplements including opioid  prescriptions. Patient is not currently taking opioid prescriptions. Functional ability and status Nutritional status Physical activity Advanced directives List of other physicians Hospitalizations, surgeries, and ER visits in previous 12 months Vitals Screenings to include cognitive, depression, and falls Referrals and appointments  In addition, I have reviewed and discussed with patient certain preventive protocols, quality metrics, and best practice recommendations. A written personalized care plan for preventive services as well as general preventive health recommendations were provided to patient.     Rhian Funari A Sister Adkins, CMA   11/25/2022   After  Visit Summary: (MyChart) Due to this being a telephonic visit, the after visit summary with patients personalized plan was offered to patient via MyChart   Nurse Notes: see routing comment

## 2022-12-12 DIAGNOSIS — E1140 Type 2 diabetes mellitus with diabetic neuropathy, unspecified: Secondary | ICD-10-CM | POA: Diagnosis not present

## 2022-12-13 LAB — BMP8+EGFR
BUN/Creatinine Ratio: 19 (ref 12–28)
BUN: 13 mg/dL (ref 8–27)
CO2: 27 mmol/L (ref 20–29)
Calcium: 9.2 mg/dL (ref 8.7–10.3)
Chloride: 101 mmol/L (ref 96–106)
Creatinine, Ser: 0.69 mg/dL (ref 0.57–1.00)
Glucose: 202 mg/dL — ABNORMAL HIGH (ref 70–99)
Potassium: 4.3 mmol/L (ref 3.5–5.2)
Sodium: 141 mmol/L (ref 134–144)
eGFR: 89 mL/min/{1.73_m2} (ref >59)

## 2022-12-13 LAB — HEMOGLOBIN A1C
Est. average glucose Bld gHb Est-mCnc: 192 mg/dL
Hgb A1c MFr Bld: 8.3 % — ABNORMAL HIGH (ref 4.8–5.6)

## 2022-12-15 ENCOUNTER — Other Ambulatory Visit: Payer: Self-pay | Admitting: Family Medicine

## 2022-12-15 DIAGNOSIS — E785 Hyperlipidemia, unspecified: Secondary | ICD-10-CM

## 2022-12-17 ENCOUNTER — Ambulatory Visit (INDEPENDENT_AMBULATORY_CARE_PROVIDER_SITE_OTHER): Payer: PPO | Admitting: Family Medicine

## 2022-12-17 ENCOUNTER — Encounter: Payer: Self-pay | Admitting: Family Medicine

## 2022-12-17 VITALS — BP 144/74 | HR 62 | Ht 69.0 in | Wt 265.0 lb

## 2022-12-17 DIAGNOSIS — E1165 Type 2 diabetes mellitus with hyperglycemia: Secondary | ICD-10-CM | POA: Diagnosis not present

## 2022-12-17 DIAGNOSIS — A630 Anogenital (venereal) warts: Secondary | ICD-10-CM

## 2022-12-17 DIAGNOSIS — Z23 Encounter for immunization: Secondary | ICD-10-CM | POA: Diagnosis not present

## 2022-12-17 DIAGNOSIS — I10 Essential (primary) hypertension: Secondary | ICD-10-CM | POA: Diagnosis not present

## 2022-12-17 DIAGNOSIS — Z7984 Long term (current) use of oral hypoglycemic drugs: Secondary | ICD-10-CM

## 2022-12-17 DIAGNOSIS — E782 Mixed hyperlipidemia: Secondary | ICD-10-CM | POA: Diagnosis not present

## 2022-12-17 MED ORDER — LANCETS MISC. MISC
1.0000 | Freq: Every day | 0 refills | Status: AC
Start: 2022-12-17 — End: 2023-01-16

## 2022-12-17 MED ORDER — EMPAGLIFLOZIN 25 MG PO TABS: 25.0000 mg | ORAL_TABLET | Freq: Every day | ORAL | 3 refills | Status: DC

## 2022-12-17 MED ORDER — BLOOD GLUCOSE TEST VI STRP: 1.0000 | ORAL_STRIP | Freq: Every day | 0 refills | Status: AC

## 2022-12-17 MED ORDER — BLOOD GLUCOSE MONITORING SUPPL DEVI: 1.0000 | Freq: Every day | 0 refills | Status: DC

## 2022-12-17 MED ORDER — LANCET DEVICE MISC: 1.0000 | Freq: Every day | 0 refills | Status: AC

## 2022-12-17 NOTE — Progress Notes (Signed)
Theresa Adkins     MRN: [id number]      DOB: 22-Dec-1945  Chief Complaint  Patient presents with   Follow-up    Mounjaro to high    HPI Theresa Adkins is here for follow up and re-evaluation of chronic medical conditions, medication management and review of any available recent lab and radiology data.  Preventive health is updated, specifically  Cancer screening and Immunization.   Questions or concerns regarding consultations or procedures which the PT has had in the interim are  addressed. The has been on no diabetes medication for over 2 months, unable to afford, also has not been testing blood sugar C/o buzzing in left ear as tho an insect is in there x 4 weeks Wants to wait until next year for surgery appt, changing ins wants a ower deductible plan Needs appointment scheduled with opthalmology ROS Denies recent fever or chills. Denies sinus pressure, nasal congestion, ear pain or sore throat. Denies chest congestion, productive cough or wheezing. Denies chest pains, palpitations and leg swelling Denies abdominal pain, nausea, vomiting,diarrhea or constipation.   Denies dysuria, frequency, hesitancy or incontinence. Denies joint pain, swelling and limitation in mobility. Denies headaches, seizures, numbness, or tingling. Denies depression, anxiety or insomnia.   PE  BP (!) 144/74   Pulse 62   Ht 5\' 9"  (1.753 m)   Wt 265 lb (120.2 kg)   SpO2 92%   BMI 39.13 kg/m   Patient alert and oriented and in no cardiopulmonary distress.  HEENT: No facial asymmetry, EOMI,     Neck supple .  Chest: Clear to auscultation bilaterally.  CVS: S1, S2 no murmurs, no S3.Regular rate.  ABD: Soft non tender.   Ext: No edema  MS: Adequate ROM spine, shoulders, hips and knees.  Skin: Intact, no ulcerations or rash noted.  Psych: Good eye contact, normal affect. Memory intact not anxious or depressed appearing.  CNS: CN 2-12 intact, power,  normal throughout.no focal deficits  noted.   Assessment & Plan  HTN (hypertension) Elevated but has gained 14 bpounds, no med change DASH diet and commitment to daily physical activity for a minimum of 30 minutes discussed and encouraged, as a part of hypertension management. The importance of attaining a healthy weight is also discussed.     12/17/2022    9:10 AM 12/17/2022    8:32 AM 12/17/2022    8:30 AM 11/25/2022    2:19 PM 09/12/2022    8:34 AM 06/27/2022    8:43 AM 05/02/2022    8:05 AM  BP/Weight  Systolic BP 144 149 146 -- 122 136 125  Diastolic BP 74 84 71 -- 70 76 65  Wt. (Lbs)   265 250 251.12 247.12 248  BMI   39.13 kg/m2 36.92 kg/m2 37.08 kg/m2 36.49 kg/m2 36.62 kg/m2       Hyperlipidemia Hyperlipidemia:Low fat diet discussed and encouraged.   Lipid Panel  Lab Results  Component Value Date   CHOL 166 09/06/2022   HDL 42 09/06/2022   LDLCALC 101 (H) 09/06/2022   TRIG 127 09/06/2022   CHOLHDL 4.0 09/06/2022     Updated lab needed at/ before next visit.   Type 2 diabetes mellitus with hyperglycemia (HCC) Needs to resume affordablwe meds, jardiance and metformin and test regularly Theresa Adkins is reminded of the importance of commitment to daily physical activity for 30 minutes or more, as able and the need to limit carbohydrate intake to 30 to 60 grams per meal to help  with blood sugar control.   The need to take medication as prescribed, test blood sugar as directed, and to call between visits if there is a concern that blood sugar is uncontrolled is also discussed.   Theresa Adkins is reminded of the importance of daily foot exam, annual eye examination, and good blood sugar, blood pressure and cholesterol control.     Latest Ref Rng & Units 12/12/2022    2:41 PM 09/06/2022    8:27 AM 04/29/2022    9:30 AM 01/21/2022    9:15 AM 10/26/2021    9:25 AM  Diabetic Labs  HbA1c 4.8 - 5.6 % 8.3  6.8  6.7  7.5  6.9    6.9   Micro/Creat Ratio 0 - 29 mg/g creat   18     Chol 100 - 199 mg/dL  161   096     HDL >04 mg/dL  42   42    Calc LDL 0 - 99 mg/dL  540   91    Triglycerides 0 - 149 mg/dL  981   191    Creatinine 0.57 - 1.00 mg/dL 4.78  2.95  6.21  3.08        12/17/2022    9:10 AM 12/17/2022    8:32 AM 12/17/2022    8:30 AM 11/25/2022    2:19 PM 09/12/2022    8:34 AM 06/27/2022    8:43 AM 05/02/2022    8:05 AM  BP/Weight  Systolic BP 144 149 146 -- 122 136 125  Diastolic BP 74 84 71 -- 70 76 65  Wt. (Lbs)   265 250 251.12 247.12 248  BMI   39.13 kg/m2 36.92 kg/m2 37.08 kg/m2 36.49 kg/m2 36.62 kg/m2      09/12/2022    8:20 AM 06/20/2021    8:40 AM  Foot/eye exam completion dates  Foot Form Completion Done Done      \  Anogenital warts Waiting on surgery eval till 2026

## 2022-12-17 NOTE — Assessment & Plan Note (Signed)
After obtaining informed consent, the vaccine is  administered , with no adverse effect noted at the time of administration.  

## 2022-12-17 NOTE — Assessment & Plan Note (Signed)
Hyperlipidemia:Low fat diet discussed and encouraged.   Lipid Panel  Lab Results  Component Value Date   CHOL 166 09/06/2022   HDL 42 09/06/2022   LDLCALC 101 (H) 09/06/2022   TRIG 127 09/06/2022   CHOLHDL 4.0 09/06/2022     Updated lab needed at/ before next visit.

## 2022-12-17 NOTE — Assessment & Plan Note (Signed)
Needs to resume affordablwe meds, jardiance and metformin and test regularly Ms. Benvenuto is reminded of the importance of commitment to daily physical activity for 30 minutes or more, as able and the need to limit carbohydrate intake to 30 to 60 grams per meal to help with blood sugar control.   The need to take medication as prescribed, test blood sugar as directed, and to call between visits if there is a concern that blood sugar is uncontrolled is also discussed.   Ms. Seas is reminded of the importance of daily foot exam, annual eye examination, and good blood sugar, blood pressure and cholesterol control.     Latest Ref Rng & Units 12/12/2022    2:41 PM 09/06/2022    8:27 AM 04/29/2022    9:30 AM 01/21/2022    9:15 AM 10/26/2021    9:25 AM  Diabetic Labs  HbA1c 4.8 - 5.6 % 8.3  6.8  6.7  7.5  6.9    6.9   Micro/Creat Ratio 0 - 29 mg/g creat   18     Chol 100 - 199 mg/dL  366   440    HDL >34 mg/dL  42   42    Calc LDL 0 - 99 mg/dL  742   91    Triglycerides 0 - 149 mg/dL  595   638    Creatinine 0.57 - 1.00 mg/dL 7.56  4.33  2.95  1.88        12/17/2022    9:10 AM 12/17/2022    8:32 AM 12/17/2022    8:30 AM 11/25/2022    2:19 PM 09/12/2022    8:34 AM 06/27/2022    8:43 AM 05/02/2022    8:05 AM  BP/Weight  Systolic BP 144 149 146 -- 122 136 125  Diastolic BP 74 84 71 -- 70 76 65  Wt. (Lbs)   265 250 251.12 247.12 248  BMI   39.13 kg/m2 36.92 kg/m2 37.08 kg/m2 36.49 kg/m2 36.62 kg/m2      09/12/2022    8:20 AM 06/20/2021    8:40 AM  Foot/eye exam completion dates  Foot Form Completion Done Done      \

## 2022-12-17 NOTE — Assessment & Plan Note (Addendum)
Elevated but has gained 14 bpounds, no med change DASH diet and commitment to daily physical activity for a minimum of 30 minutes discussed and encouraged, as a part of hypertension management. The importance of attaining a healthy weight is also discussed.     12/17/2022    9:10 AM 12/17/2022    8:32 AM 12/17/2022    8:30 AM 11/25/2022    2:19 PM 09/12/2022    8:34 AM 06/27/2022    8:43 AM 05/02/2022    8:05 AM  BP/Weight  Systolic BP 144 149 146 -- 122 136 125  Diastolic BP 74 84 71 -- 70 76 65  Wt. (Lbs)   265 250 251.12 247.12 248  BMI   39.13 kg/m2 36.92 kg/m2 37.08 kg/m2 36.49 kg/m2 36.62 kg/m2

## 2022-12-17 NOTE — Patient Instructions (Addendum)
F/u in 13 weeks, call if tyou need me sooner  Fasting lipid, cmp and EGFr andd hBa1C 3 to 5  days before appt  Blood sugar medication will  be resumed.  Please Work on 14 pound weight loss, blood pressure will get back in range   Jardiance 25 mg one daily ( sent in) Metformin 1000 mg one daily. If what you have at home is over 77 year old, you need to let us know so that  we can send in a new bottle  Need to test and record every morning, we will give you a log sheet  Nurse please  send in for meter and once daily test supplies  Goal for fasting blood sugar ranges from 8 0 to 135   Nurse please give pt number for the Ophthalmology/optometry office she has been authorized to go to so she can schedule  her appointment   Flu vaccine  today  Pt states she has had 2 shingrix vaccines pls track and record if this is so

## 2022-12-17 NOTE — Assessment & Plan Note (Signed)
Waiting on surgery eval till 2026

## 2022-12-19 ENCOUNTER — Other Ambulatory Visit: Payer: Self-pay | Admitting: Family Medicine

## 2022-12-23 ENCOUNTER — Telehealth: Payer: Self-pay | Admitting: Family Medicine

## 2022-12-23 NOTE — Telephone Encounter (Signed)
Patient called in regard to    Referred To  [facility]  Non-Preferred Selection  Ophthalmology   Office does not do eye exams  Pt wants a call back

## 2022-12-24 NOTE — Telephone Encounter (Signed)
Left message to call back and let us know who she wants the referral sent to

## 2023-01-14 ENCOUNTER — Other Ambulatory Visit: Payer: Self-pay | Admitting: Family Medicine

## 2023-01-15 ENCOUNTER — Other Ambulatory Visit: Payer: Self-pay | Admitting: Family Medicine

## 2023-02-02 ENCOUNTER — Other Ambulatory Visit: Payer: Self-pay | Admitting: Family Medicine

## 2023-03-13 DIAGNOSIS — I10 Essential (primary) hypertension: Secondary | ICD-10-CM | POA: Diagnosis not present

## 2023-03-13 DIAGNOSIS — E1140 Type 2 diabetes mellitus with diabetic neuropathy, unspecified: Secondary | ICD-10-CM | POA: Diagnosis not present

## 2023-03-13 DIAGNOSIS — E782 Mixed hyperlipidemia: Secondary | ICD-10-CM | POA: Diagnosis not present

## 2023-03-14 LAB — CMP14+EGFR
ALT: 8 [IU]/L (ref 0–32)
AST: 16 [IU]/L (ref 0–40)
Albumin: 4.3 g/dL (ref 3.8–4.8)
Alkaline Phosphatase: 63 [IU]/L (ref 44–121)
BUN/Creatinine Ratio: 14 (ref 12–28)
BUN: 12 mg/dL (ref 8–27)
Bilirubin Total: 0.4 mg/dL (ref 0.0–1.2)
CO2: 22 mmol/L (ref 20–29)
Calcium: 9.2 mg/dL (ref 8.7–10.3)
Chloride: 103 mmol/L (ref 96–106)
Creatinine, Ser: 0.84 mg/dL (ref 0.57–1.00)
Globulin, Total: 3.2 g/dL (ref 1.5–4.5)
Glucose: 117 mg/dL — ABNORMAL HIGH (ref 70–99)
Potassium: 4.3 mmol/L (ref 3.5–5.2)
Sodium: 142 mmol/L (ref 134–144)
Total Protein: 7.5 g/dL (ref 6.0–8.5)
eGFR: 72 mL/min/{1.73_m2} (ref >59)

## 2023-03-14 LAB — LIPID PANEL
Chol/HDL Ratio: 3.5 {ratio} (ref 0.0–4.4)
Cholesterol, Total: 162 mg/dL (ref 100–199)
HDL: 46 mg/dL (ref >39)
LDL Chol Calc (NIH): 94 mg/dL (ref 0–99)
Triglycerides: 125 mg/dL (ref 0–149)
VLDL Cholesterol Cal: 22 mg/dL (ref 5–40)

## 2023-03-14 LAB — HEMOGLOBIN A1C
Est. average glucose Bld gHb Est-mCnc: 169 mg/dL
Hgb A1c MFr Bld: 7.5 % — ABNORMAL HIGH (ref 4.8–5.6)

## 2023-03-15 ENCOUNTER — Other Ambulatory Visit: Payer: Self-pay | Admitting: Family Medicine

## 2023-03-15 DIAGNOSIS — E785 Hyperlipidemia, unspecified: Secondary | ICD-10-CM

## 2023-03-18 ENCOUNTER — Other Ambulatory Visit: Payer: Self-pay | Admitting: Family Medicine

## 2023-03-18 ENCOUNTER — Encounter: Payer: Self-pay | Admitting: Family Medicine

## 2023-03-18 ENCOUNTER — Ambulatory Visit (INDEPENDENT_AMBULATORY_CARE_PROVIDER_SITE_OTHER): Payer: PPO | Admitting: Family Medicine

## 2023-03-18 VITALS — BP 121/67 | HR 63 | Ht 69.0 in | Wt 254.0 lb

## 2023-03-18 DIAGNOSIS — I10 Essential (primary) hypertension: Secondary | ICD-10-CM | POA: Diagnosis not present

## 2023-03-18 DIAGNOSIS — E782 Mixed hyperlipidemia: Secondary | ICD-10-CM | POA: Diagnosis not present

## 2023-03-18 DIAGNOSIS — Z1231 Encounter for screening mammogram for malignant neoplasm of breast: Secondary | ICD-10-CM | POA: Diagnosis not present

## 2023-03-18 DIAGNOSIS — E1169 Type 2 diabetes mellitus with other specified complication: Secondary | ICD-10-CM | POA: Diagnosis not present

## 2023-03-18 DIAGNOSIS — Z780 Asymptomatic menopausal state: Secondary | ICD-10-CM

## 2023-03-18 MED ORDER — TIRZEPATIDE 7.5 MG/0.5ML ~~LOC~~ SOAJ: 7.5000 mg | SUBCUTANEOUS | 3 refills | Status: DC

## 2023-03-18 MED ORDER — UNABLE TO FIND: 0 refills | Status: AC

## 2023-03-18 NOTE — Patient Instructions (Signed)
Follow-up in 13 weeks, call if you need me sooner.  Congratulations on marked improvement in your blood sugar and excellent labs overall.  For your diabetes take Mounjaro 7.5 mg weekly as well as metformin.   Stop jardiance as you have already done.Continue to reduce sweets and starchy foods.  Please schedule bone density test and mammogram at checkout.  Nurse please give prescription for diabetic shoes.  It is important that you exercise regularly at least 30 minutes 5 times a week. If you develop chest pain, have severe difficulty breathing, or feel very tired, stop exercising immediately and seek medical attention   Thanks for choosing [facility], we consider it a privelige to serve you.

## 2023-03-25 ENCOUNTER — Ambulatory Visit (HOSPITAL_COMMUNITY)
Admission: RE | Admit: 2023-03-25 | Discharge: 2023-03-25 | Disposition: A | Discharge status: Home / Self Care | Payer: PPO | Source: Ambulatory Visit | Attending: Family Medicine | Admitting: Family Medicine

## 2023-03-25 DIAGNOSIS — Z780 Asymptomatic menopausal state: Secondary | ICD-10-CM

## 2023-03-25 NOTE — Assessment & Plan Note (Signed)
Controlled, no change in medication DASH diet and commitment to daily physical activity for a minimum of 30 minutes discussed and encouraged, as a part of hypertension management. The importance of attaining a healthy weight is also discussed.     03/18/2023    8:50 AM 12/17/2022    9:10 AM 12/17/2022    8:32 AM 12/17/2022    8:30 AM 11/25/2022    2:19 PM 09/12/2022    8:34 AM 06/27/2022    8:43 AM  BP/Weight  Systolic BP 121 144 149 146 -- 122 136  Diastolic BP 67 74 84 71 -- 70 76  Wt. (Lbs) 254   265 250 251.12 247.12  BMI 37.51 kg/m2   39.13 kg/m2 36.92 kg/m2 37.08 kg/m2 36.49 kg/m2

## 2023-03-25 NOTE — Progress Notes (Signed)
Theresa Adkins     MRN: [id number]      DOB: April 08, 1945  Chief Complaint  Patient presents with   Follow-up    Follow up jardiance causing ulcers on bottom pt has stopped taking     HPI Theresa Adkins is here for follow up and re-evaluation of chronic medical conditions, medication management and review of any available recent lab and radiology data.  Preventive health is updated, specifically  Cancer screening and Immunization.   Questions or concerns regarding consultations or procedures which the PT has had in the interim are  addressed. The PT stopped jardiance as she has problems with perianal ulcers which is reasonable Denies polyuria, polydipsia, blurred vision , or hypoglycemic episodes.   ROS Denies recent fever or chills. Denies sinus pressure, nasal congestion, ear pain or sore throat. Denies chest congestion, productive cough or wheezing. Denies chest pains, palpitations and leg swelling Denies abdominal pain, nausea, vomiting,diarrhea or constipation.   Denies dysuria, frequency, hesitancy or incontinence. Denies uncontrolled  joint pain, swelling and limitation in mobility. Denies headaches, seizures, numbness, or tingling. Denies depression, anxiety or insomnia.  PE  BP 121/67 (BP Location: Right Arm, Patient Position: Sitting, Cuff Size: Large)   Pulse 63   Ht 5\' 9"  (1.753 m)   Wt 254 lb (115.2 kg)   SpO2 94%   BMI 37.51 kg/m   Patient alert and oriented and in no cardiopulmonary distress.  HEENT: No facial asymmetry, EOMI,     Neck supple .  Chest: Clear to auscultation bilaterally.  CVS: S1, S2 no murmurs, no S3.Regular rate.  ABD: Soft non tender.   Ext: No edema  MS: Adequate though reduced  ROM spine, shoulders, hips and knees.  Skin: Intact, no ulcerations or rash noted.  Psych: Good eye contact, normal affect. Memory intact not anxious or depressed appearing.  CNS: CN 2-12 intact, power,  normal throughout.no focal deficits  noted.   Assessment & Plan  HTN (hypertension) Controlled, no change in medication DASH diet and commitment to daily physical activity for a minimum of 30 minutes discussed and encouraged, as a part of hypertension management. The importance of attaining a healthy weight is also discussed.     03/18/2023    8:50 AM 12/17/2022    9:10 AM 12/17/2022    8:32 AM 12/17/2022    8:30 AM 11/25/2022    2:19 PM 09/12/2022    8:34 AM 06/27/2022    8:43 AM  BP/Weight  Systolic BP 121 144 149 146 -- 122 136  Diastolic BP 67 74 84 71 -- 70 76  Wt. (Lbs) 254   265 250 251.12 247.12  BMI 37.51 kg/m2   39.13 kg/m2 36.92 kg/m2 37.08 kg/m2 36.49 kg/m2       Morbid obesity (HCC) Improved, she is congratulated on this  Patient re-educated about  the importance of commitment to a  minimum of 150 minutes of exercise per week as able.  The importance of healthy food choices with portion control discussed, as well as eating regularly and within a 12 hour window most days. The need to choose "clean , green" food 50 to 75% of the time is discussed, as well as to make water the primary drink and set a goal of 64 ounces water daily.       03/18/2023    8:50 AM 12/17/2022    8:30 AM 11/25/2022    2:19 PM  Weight /BMI  Weight 254 lb 265 lb 250 lb  Height 5\' 9"  (1.753 m) 5\' 9"  (1.753 m) 5\' 9"  (1.753 m)  BMI 37.51 kg/m2 39.13 kg/m2 36.92 kg/m2    Inc mounjaro dose  Type 2 diabetes mellitus with other specified complication (HCC) Diabetes associated with hypertension, hyperlipidemia, and obesity  Theresa Adkins is reminded of the importance of commitment to daily physical activity for 30 minutes or more, as able and the need to limit carbohydrate intake to 30 to 60 grams per meal to help with blood sugar control.   The need to take medication as prescribed, test blood sugar as directed, and to call between visits if there is a concern that blood sugar is uncontrolled is also discussed.   Theresa Adkins is  reminded of the importance of daily foot exam, annual eye examination, and good blood sugar, blood pressure and cholesterol control.     Latest Ref Rng & Units 03/13/2023   10:04 AM 12/12/2022    2:41 PM 09/06/2022    8:27 AM 04/29/2022    9:30 AM 01/21/2022    9:15 AM  Diabetic Labs  HbA1c 4.8 - 5.6 % 7.5  8.3  6.8  6.7  7.5   Micro/Creat Ratio 0 - 29 mg/g creat    18    Chol 100 - 199 mg/dL 161   096   045   HDL >40 mg/dL 46   42   42   Calc LDL 0 - 99 mg/dL 94   981   91   Triglycerides 0 - 149 mg/dL 191   478   295   Creatinine 0.57 - 1.00 mg/dL 6.21  3.08  6.57  8.46  0.83       03/18/2023    8:50 AM 12/17/2022    9:10 AM 12/17/2022    8:32 AM 12/17/2022    8:30 AM 11/25/2022    2:19 PM 09/12/2022    8:34 AM 06/27/2022    8:43 AM  BP/Weight  Systolic BP 121 144 149 146 -- 122 136  Diastolic BP 67 74 84 71 -- 70 76  Wt. (Lbs) 254   265 250 251.12 247.12  BMI 37.51 kg/m2   39.13 kg/m2 36.92 kg/m2 37.08 kg/m2 36.49 kg/m2      09/12/2022    8:20 AM 06/20/2021    8:40 AM  Foot/eye exam completion dates  Foot Form Completion Done Done      Improved , inc mounjaro dose  Hyperlipidemia Hyperlipidemia:Low fat diet discussed and encouraged.   Lipid Panel  Lab Results  Component Value Date   CHOL 162 03/13/2023   HDL 46 03/13/2023   LDLCALC 94 03/13/2023   TRIG 125 03/13/2023   CHOLHDL 3.5 03/13/2023     Controlled, no change in medication

## 2023-03-25 NOTE — Assessment & Plan Note (Addendum)
 Diabetes associated with hypertension, hyperlipidemia, and obesity  Ms. Leeson is reminded of the importance of commitment to daily physical activity for 30 minutes or more, as able and the need to limit carbohydrate intake to 30 to 60 grams per meal to help with blood sugar control.   The need to take medication as prescribed, test blood sugar as directed, and to call between visits if there is a concern that blood sugar is uncontrolled is also discussed.   Ms. Heagle is reminded of the importance of daily foot exam, annual eye examination, and good blood sugar, blood pressure and cholesterol control.     Latest Ref Rng & Units 03/13/2023   10:04 AM 12/12/2022    2:41 PM 09/06/2022    8:27 AM 04/29/2022    9:30 AM 01/21/2022    9:15 AM  Diabetic Labs  HbA1c 4.8 - 5.6 % 7.5  8.3  6.8  6.7  7.5   Micro/Creat Ratio 0 - 29 mg/g creat    18    Chol 100 - 199 mg/dL 837   833   839   HDL >60 mg/dL 46   42   42   Calc LDL 0 - 99 mg/dL 94   898   91   Triglycerides 0 - 149 mg/dL 874   872   846   Creatinine 0.57 - 1.00 mg/dL 9.15  9.30  9.19  9.17  0.83       03/18/2023    8:50 AM 12/17/2022    9:10 AM 12/17/2022    8:32 AM 12/17/2022    8:30 AM 11/25/2022    2:19 PM 09/12/2022    8:34 AM 06/27/2022    8:43 AM  BP/Weight  Systolic BP 121 144 149 146 -- 122 136  Diastolic BP 67 74 84 71 -- 70 76  Wt. (Lbs) 254   265 250 251.12 247.12  BMI 37.51 kg/m2   39.13 kg/m2 36.92 kg/m2 37.08 kg/m2 36.49 kg/m2      09/12/2022    8:20 AM 06/20/2021    8:40 AM  Foot/eye exam completion dates  Foot Form Completion Done Done      Improved , inc mounjaro  dose

## 2023-03-25 NOTE — Assessment & Plan Note (Signed)
 Improved, she is congratulated on this  Patient re-educated about  the importance of commitment to a  minimum of 150 minutes of exercise per week as able.  The importance of healthy food choices with portion control discussed, as well as eating regularly and within a 12 hour window most days. The need to choose clean , green food 50 to 75% of the time is discussed, as well as to make water  the primary drink and set a goal of 64 ounces water  daily.       03/18/2023    8:50 AM 12/17/2022    8:30 AM 11/25/2022    2:19 PM  Weight /BMI  Weight 254 lb 265 lb 250 lb  Height 5' 9 (1.753 m) 5' 9 (1.753 m) 5' 9 (1.753 m)  BMI 37.51 kg/m2 39.13 kg/m2 36.92 kg/m2    Inc mounjaro  dose

## 2023-03-25 NOTE — Assessment & Plan Note (Signed)
 Hyperlipidemia:Low fat diet discussed and encouraged.   Lipid Panel  Lab Results  Component Value Date   CHOL 162 03/13/2023   HDL 46 03/13/2023   LDLCALC 94 03/13/2023   TRIG 125 03/13/2023   CHOLHDL 3.5 03/13/2023     Controlled, no change in medication

## 2023-03-26 ENCOUNTER — Encounter: Payer: Self-pay | Admitting: Family Medicine

## 2023-03-28 ENCOUNTER — Other Ambulatory Visit: Payer: Self-pay | Admitting: Family Medicine

## 2023-04-05 ENCOUNTER — Other Ambulatory Visit: Payer: Self-pay | Admitting: Family Medicine

## 2023-04-07 ENCOUNTER — Telehealth: Payer: Self-pay | Admitting: Family Medicine

## 2023-04-07 NOTE — Telephone Encounter (Signed)
 Copied from CRM [id number]. Topic: Clinical - Prescription Issue >> Apr 07, 2023  3:41 PM Carlatta H wrote: Reason for CRM: Patient pharmacy no longer has the accucheck so it needs to ultrafine and she needs the whole set prescribed and sent to the pharmacy//Please call the patient once the prescription is called in//

## 2023-04-09 ENCOUNTER — Other Ambulatory Visit: Payer: Self-pay

## 2023-04-09 MED ORDER — ONETOUCH DELICA LANCETS 33G MISC: 12 refills | Status: AC

## 2023-04-09 MED ORDER — BLOOD GLUCOSE MONITORING SUPPL DEVI: 1.0000 | Freq: Every day | 0 refills | Status: AC

## 2023-04-09 MED ORDER — GLUCOSE BLOOD VI STRP: ORAL_STRIP | 12 refills | Status: AC

## 2023-04-09 NOTE — Telephone Encounter (Signed)
 New strips lancets and device sent to [facility]

## 2023-04-15 ENCOUNTER — Other Ambulatory Visit: Payer: Self-pay | Admitting: Family Medicine

## 2023-04-17 DIAGNOSIS — H52223 Regular astigmatism, bilateral: Secondary | ICD-10-CM | POA: Diagnosis not present

## 2023-04-17 DIAGNOSIS — E1140 Type 2 diabetes mellitus with diabetic neuropathy, unspecified: Secondary | ICD-10-CM | POA: Diagnosis not present

## 2023-04-17 DIAGNOSIS — H25813 Combined forms of age-related cataract, bilateral: Secondary | ICD-10-CM | POA: Diagnosis not present

## 2023-04-17 DIAGNOSIS — H5213 Myopia, bilateral: Secondary | ICD-10-CM | POA: Diagnosis not present

## 2023-04-17 DIAGNOSIS — H524 Presbyopia: Secondary | ICD-10-CM | POA: Diagnosis not present

## 2023-04-17 LAB — HM DIABETES EYE EXAM

## 2023-04-29 ENCOUNTER — Encounter: Payer: Self-pay | Admitting: Family Medicine

## 2023-06-06 NOTE — Telephone Encounter (Unsigned)
 Copied from CRM [id number]. Topic: Clinical - Request for Lab/Test Order >> Jun 06, 2023  2:21 PM Theresa Adkins wrote: Reason for CRM: Patient called stating she is wanting to get lab work done before her appt on May 1st and started she always gets it done week before her appt and I called clinic and they stated no future orders are in so it's looking like she will have to wait until Dr.Simpson get back into the office on the 29th

## 2023-06-07 ENCOUNTER — Other Ambulatory Visit: Payer: Self-pay | Admitting: Family Medicine

## 2023-06-07 DIAGNOSIS — E785 Hyperlipidemia, unspecified: Secondary | ICD-10-CM

## 2023-06-17 NOTE — Telephone Encounter (Signed)
 Called pt. Unable to come into office before thursday. Will complete blood work at office visit Thursday.

## 2023-06-18 ENCOUNTER — Ambulatory Visit: Payer: PPO | Admitting: Family Medicine

## 2023-06-19 ENCOUNTER — Other Ambulatory Visit: Payer: Self-pay | Admitting: Family Medicine

## 2023-06-19 ENCOUNTER — Ambulatory Visit: Payer: PPO | Admitting: Family Medicine

## 2023-06-19 DIAGNOSIS — E782 Mixed hyperlipidemia: Secondary | ICD-10-CM

## 2023-06-19 DIAGNOSIS — E1169 Type 2 diabetes mellitus with other specified complication: Secondary | ICD-10-CM

## 2023-06-19 DIAGNOSIS — E559 Vitamin D deficiency, unspecified: Secondary | ICD-10-CM

## 2023-06-20 LAB — CBC WITH DIFFERENTIAL/PLATELET
Basophils Absolute: 0 10*3/uL (ref 0.0–0.2)
Basos: 1 %
EOS (ABSOLUTE): 0.1 10*3/uL (ref 0.0–0.4)
Eos: 1 %
Hematocrit: 34.3 % (ref 34.0–46.6)
Hemoglobin: 11.1 g/dL (ref 11.1–15.9)
Immature Grans (Abs): 0 10*3/uL (ref 0.0–0.1)
Immature Granulocytes: 0 %
Lymphocytes Absolute: 1.7 10*3/uL (ref 0.7–3.1)
Lymphs: 24 %
MCH: 26.2 pg — ABNORMAL LOW (ref 26.6–33.0)
MCHC: 32.4 g/dL (ref 31.5–35.7)
MCV: 81 fL (ref 79–97)
Monocytes Absolute: 0.5 10*3/uL (ref 0.1–0.9)
Monocytes: 7 %
Neutrophils Absolute: 4.7 10*3/uL (ref 1.4–7.0)
Neutrophils: 67 %
Platelets: 390 10*3/uL (ref 150–450)
RBC: 4.24 x10E6/uL (ref 3.77–5.28)
RDW: 15.9 % — ABNORMAL HIGH (ref 11.7–15.4)
WBC: 7 10*3/uL (ref 3.4–10.8)

## 2023-06-20 LAB — CMP14+EGFR
ALT: 13 IU/L (ref 0–32)
AST: 16 IU/L (ref 0–40)
Albumin: 4.1 g/dL (ref 3.8–4.8)
Alkaline Phosphatase: 49 IU/L (ref 44–121)
BUN/Creatinine Ratio: 11 — ABNORMAL LOW (ref 12–28)
BUN: 8 mg/dL (ref 8–27)
Bilirubin Total: 0.3 mg/dL (ref 0.0–1.2)
CO2: 23 mmol/L (ref 20–29)
Calcium: 9.2 mg/dL (ref 8.7–10.3)
Chloride: 101 mmol/L (ref 96–106)
Creatinine, Ser: 0.76 mg/dL (ref 0.57–1.00)
Globulin, Total: 2.8 g/dL (ref 1.5–4.5)
Glucose: 117 mg/dL — ABNORMAL HIGH (ref 70–99)
Potassium: 4.3 mmol/L (ref 3.5–5.2)
Sodium: 140 mmol/L (ref 134–144)
Total Protein: 6.9 g/dL (ref 6.0–8.5)
eGFR: 81 mL/min/{1.73_m2} (ref >59)

## 2023-06-20 LAB — LIPID PANEL
Chol/HDL Ratio: 3.1 ratio (ref 0.0–4.4)
Cholesterol, Total: 175 mg/dL (ref 100–199)
HDL: 56 mg/dL (ref >39)
LDL Chol Calc (NIH): 101 mg/dL — ABNORMAL HIGH (ref 0–99)
Triglycerides: 101 mg/dL (ref 0–149)
VLDL Cholesterol Cal: 18 mg/dL (ref 5–40)

## 2023-06-20 LAB — HEMOGLOBIN A1C
Est. average glucose Bld gHb Est-mCnc: 151 mg/dL
Hgb A1c MFr Bld: 6.9 % — ABNORMAL HIGH (ref 4.8–5.6)

## 2023-06-26 ENCOUNTER — Ambulatory Visit (INDEPENDENT_AMBULATORY_CARE_PROVIDER_SITE_OTHER): Admitting: Family Medicine

## 2023-06-26 ENCOUNTER — Encounter: Payer: Self-pay | Admitting: Family Medicine

## 2023-06-26 ENCOUNTER — Telehealth: Payer: Self-pay | Admitting: Family Medicine

## 2023-06-26 VITALS — BP 121/72 | HR 70 | Resp 18 | Ht 69.0 in | Wt 247.1 lb

## 2023-06-26 DIAGNOSIS — L729 Follicular cyst of the skin and subcutaneous tissue, unspecified: Secondary | ICD-10-CM

## 2023-06-26 DIAGNOSIS — L089 Local infection of the skin and subcutaneous tissue, unspecified: Secondary | ICD-10-CM | POA: Diagnosis not present

## 2023-06-26 DIAGNOSIS — A630 Anogenital (venereal) warts: Secondary | ICD-10-CM

## 2023-06-26 DIAGNOSIS — I10 Essential (primary) hypertension: Secondary | ICD-10-CM

## 2023-06-26 DIAGNOSIS — E782 Mixed hyperlipidemia: Secondary | ICD-10-CM

## 2023-06-26 DIAGNOSIS — L732 Hidradenitis suppurativa: Secondary | ICD-10-CM | POA: Diagnosis not present

## 2023-06-26 DIAGNOSIS — E1169 Type 2 diabetes mellitus with other specified complication: Secondary | ICD-10-CM | POA: Diagnosis not present

## 2023-06-26 MED ORDER — AMOXICILLIN-POT CLAVULANATE 875-125 MG PO TABS: 1.0000 | ORAL_TABLET | Freq: Two times a day (BID) | ORAL | 0 refills | Status: DC

## 2023-06-26 NOTE — Patient Instructions (Addendum)
 Annual exam end August call if you need me sooner  Urine ACR today  AUGMENTIN IS PRESCRIBED FOR INFECTED CYST ON UPPER BACK AND YOU ARE REFERRED TO GENERAL SURGERY IN [place]  6 MONTH HANDICAP STICKER TO BE PROVIDED TODAY BEFORE YOU LEAVE NURSE PLS GIVE ME PAPERWORK TO SIGN  YOU ARE REFERRED TO GYNECOLOGY RE  WARTS AND CYSTS  EXCELLENT BLOOD PRESSURE, BLOOD SUGAR,  CHOLESTEROL, LIVER AND KIDNEY AND BONE NE MARROW FUNCTION  sTAY ON SAME MEDICATIONS  PLEASE SCHEDULE MAMMOGRAM AT CHECKOUT  FANON FASTING HBA11C, CHEM 7 AND EGFR 5 DAYS BEFORE FOLLOW UP  Thanks for choosing [facility], we consider it a privelige to serve you.  NURSE PLS PROVIDE PATIENT WITH PRINTED LAB REPORT

## 2023-06-26 NOTE — Assessment & Plan Note (Addendum)
 multiple infexted labial cysts, and inguinal adnopathy, also anogenital warts, worsening, has seensurgeon in the past, did not want recommended intervention at the time, will see new Provider, will refer to [facility] locally Dr Randolm Butte

## 2023-06-26 NOTE — Assessment & Plan Note (Signed)
 Progressed last  eval in 2022 , declined surgery , needs intervention refer Gyne

## 2023-06-26 NOTE — Telephone Encounter (Signed)
 Patient will call and schedule mammo with breast center personally

## 2023-06-28 LAB — MICROALBUMIN / CREATININE URINE RATIO
Creatinine, Urine: 97.1 mg/dL
Microalb/Creat Ratio: 14 mg/g{creat} (ref 0–29)
Microalbumin, Urine: 13.3 ug/mL

## 2023-06-30 ENCOUNTER — Other Ambulatory Visit: Payer: Self-pay | Admitting: Family Medicine

## 2023-07-01 ENCOUNTER — Encounter: Payer: Self-pay | Admitting: Family Medicine

## 2023-07-01 NOTE — Progress Notes (Signed)
 SHANINA HEIDECKER     MRN: [id number]      DOB: 1945-08-24  Chief Complaint  Patient presents with   Medical Management of Chronic Issues    13 week follow up    Sore    Has a sore/bump on upper back that has been hurting x2 weeks. States it is painful and discharges fluid consistently throughout the day. No fever.      HPI Ms. Trevillion is here for follow up and re-evaluation of chronic medical conditions, medication management and review of any available recent lab and radiology data.  Preventive health is updated, specifically  Cancer screening and Immunization.   C/o worsening of ulcers and nodules in vulvovaginal area, thinks medication is contributing but I explain this ois not the case, she has severe hydradenits and anogenital warrts, excision was proposed in the past and she refused at the time, wants re eval and requests 2nd opinion C/o infected cyst on upper  right back with foul smelling drainage,x weeks, no fever or chills Denies polyuria, polydipsia, blurred vision , or hypoglycemic episodes. Requests handicap sticker due to arthritis and current severe skin infection, difficulty and pain with all movement and sitting  . ROS Denies recent fever or chills. Denies sinus pressure, nasal congestion, ear pain or sore throat. Denies chest congestion, productive cough or wheezing. Denies chest pains, palpitations and leg swelling Denies abdominal pain, nausea, vomiting,diarrhea or constipation.   Denies dysuria, frequency, hesitancy or incontinence. Chronic  joint pain, swelling and limitation in mobility. Denies headaches, seizures, numbness, or tingling. Denies depression, anxiety or insomnia.  PE  BP 121/72   Pulse 70   Resp 18   Ht 5\' 9"  (1.753 m)   Wt 247 lb 1.9 oz (112.1 kg)   SpO2 95%   BMI 36.49 kg/m   Patient alert and oriented and in no cardiopulmonary distress.  HEENT: No facial asymmetry, EOMI,     Neck supple .  Chest: Clear to auscultation  bilaterally.  CVS: S1, S2 no murmurs, no S3.Regular rate.  ABD: Soft non tender.   Ext: No edema  MS: decreased  ROM spine, shoulders, hips and knees.  Skin: Inflected cyst right upper back, foul smelling drainage, diameter approx 4 cm and dep  Anogenital skin multiple tender nodules and warts.  Psych: Good eye contact, normal affect. Memory intact not anxious or depressed appearing.  CNS: CN 2-12 intact, power,  normal throughout.no focal deficits noted.   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 The importance of attaining a healthy weight is also discussed.     06/26/2023    2:09 PM 03/18/2023    8:50 AM 12/17/2022    9:10 AM 12/17/2022    8:32 AM 12/17/2022    8:30 AM 11/25/2022    2:19 PM 09/12/2022    8:34  AM  BP/Weight  Systolic BP 121 121 144 149 146 -- 122  Diastolic BP 72 67 74 84 71 -- 70  Wt. (Lbs) 247.12 254   265 250 251.12  BMI 36.49 kg/m2 37.51 kg/m2   39.13 kg/m2 36.92 kg/m2 37.08 kg/m2       Type 2 diabetes mellitus with other specified complication (HCC) Diabetes associated with hypertension, hyperlipidemia, obesity, and arthritis  Ms. Taneja is reminded of the importance of commitment to daily physical activity for 30  minutes or more, as able and the need to limit carbohydrate intake to 30 to 60 grams per meal to help with blood sugar control.   The need to take medication as prescribed, test blood sugar as directed, and to call between visits if there is a concern that blood sugar is uncontrolled is also discussed.   Ms. Isom is reminded of the importance of daily foot exam, annual eye examination, and good blood sugar, blood pressure and cholesterol control.     Latest Ref Rng & Units 06/26/2023    3:25 PM 06/19/2023    8:56 AM 03/13/2023   10:04 AM 12/12/2022    2:41 PM 09/06/2022    8:27 AM  Diabetic Labs  HbA1c 4.8 - 5.6 %  6.9  7.5  8.3  6.8   Micro/Creat Ratio 0 - 29 mg/g creat 14       Chol 100 - 199 mg/dL  161  096   045   HDL >40 mg/dL  56  46   42   Calc LDL 0 - 99 mg/dL  981  94   191   Triglycerides 0 - 149 mg/dL  478  295   621   Creatinine 0.57 - 1.00 mg/dL  3.08  6.57  8.46  9.62       06/26/2023    2:09 PM 03/18/2023    8:50 AM 12/17/2022    9:10 AM 12/17/2022    8:32 AM 12/17/2022    8:30 AM 11/25/2022    2:19 PM 09/12/2022    8:34 AM  BP/Weight  Systolic BP 121 121 144 149 146 -- 122  Diastolic BP 72 67 74 84 71 -- 70  Wt. (Lbs) 247.12 254   265 250 251.12  BMI 36.49 kg/m2 37.51 kg/m2   39.13 kg/m2 36.92 kg/m2 37.08 kg/m2      Latest Ref Rng & Units 04/17/2023   12:00 AM 09/12/2022    8:20 AM  Foot/eye exam completion dates  Eye Exam No Retinopathy No Retinopathy       Foot Form Completion   Done     This result is from an external source.    Improved continue current meds    Hyperlipidemia Hyperlipidemia:Low fat diet discussed and encouraged.   Lipid Panel  Lab Results  Component Value Date   CHOL 175 06/19/2023   HDL 56 06/19/2023   LDLCALC 101 (H) 06/19/2023   TRIG 101 06/19/2023   CHOLHDL 3.1 06/19/2023     Need to reduce fat intake no med change  Morbid obesity (HCC)  Patient re-educated about  the importance of commitment to a  minimum of 150  minutes of exercise per week as able.  The importance of healthy food choices with portion control discussed, as well as eating regularly and within a 12 hour window most days. The need to choose "clean , green" food 50 to 75% of the time is discussed, as well as to make water  the primary drink  and set a goal of 64 ounces water  daily.       06/26/2023    2:09 PM 03/18/2023    8:50 AM 12/17/2022    8:30 AM  Weight /BMI  Weight 247 lb 1.9 oz 254 lb 265 lb  Height 5\' 9"  (1.753 m) 5\' 9"  (1.753 m) 5\' 9"  (1.753 m)  BMI 36.49 kg/m2 37.51 kg/m2 39.13 kg/m2    Excellent weight loss on current med regime continue same

## 2023-07-01 NOTE — Assessment & Plan Note (Signed)
 Controlled, no change in medication DASH diet and commitment to daily physical activity for a minimum of 30 minutes discussed and encouraged, as a part of hypertension management. The importance of attaining a healthy weight is also discussed.     06/26/2023    2:09 PM 03/18/2023    8:50 AM 12/17/2022    9:10 AM 12/17/2022    8:32 AM 12/17/2022    8:30 AM 11/25/2022    2:19 PM 09/12/2022    8:34 AM  BP/Weight  Systolic BP 121 121 144 149 146 -- 122  Diastolic BP 72 67 74 84 71 -- 70  Wt. (Lbs) 247.12 254   265 250 251.12  BMI 36.49 kg/m2 37.51 kg/m2   39.13 kg/m2 36.92 kg/m2 37.08 kg/m2

## 2023-07-01 NOTE — Assessment & Plan Note (Signed)
  Patient re-educated about  the importance of commitment to a  minimum of 150 minutes of exercise per week as able.  The importance of healthy food choices with portion control discussed, as well as eating regularly and within a 12 hour window most days. The need to choose "clean , green" food 50 to 75% of the time is discussed, as well as to make water  the primary drink and set a goal of 64 ounces water  daily.       06/26/2023    2:09 PM 03/18/2023    8:50 AM 12/17/2022    8:30 AM  Weight /BMI  Weight 247 lb 1.9 oz 254 lb 265 lb  Height 5\' 9"  (1.753 m) 5\' 9"  (1.753 m) 5\' 9"  (1.753 m)  BMI 36.49 kg/m2 37.51 kg/m2 39.13 kg/m2    Excellent weight loss on current med regime continue same

## 2023-07-01 NOTE — Assessment & Plan Note (Signed)
 Malodorous drainage from cyst, needs surgical excision as deep and large, augmentin  prescribed and referred to surgery

## 2023-07-01 NOTE — Assessment & Plan Note (Signed)
 Diabetes associated with hypertension, hyperlipidemia, obesity, and arthritis  Theresa Adkins is reminded of the importance of commitment to daily physical activity for 30 minutes or more, as able and the need to limit carbohydrate intake to 30 to 60 grams per meal to help with blood sugar control.   The need to take medication as prescribed, test blood sugar as directed, and to call between visits if there is a concern that blood sugar is uncontrolled is also discussed.   Theresa Adkins is reminded of the importance of daily foot exam, annual eye examination, and good blood sugar, blood pressure and cholesterol control.     Latest Ref Rng & Units 06/26/2023    3:25 PM 06/19/2023    8:56 AM 03/13/2023   10:04 AM 12/12/2022    2:41 PM 09/06/2022    8:27 AM  Diabetic Labs  HbA1c 4.8 - 5.6 %  6.9  7.5  8.3  6.8   Micro/Creat Ratio 0 - 29 mg/g creat 14       Chol 100 - 199 mg/dL  956  213   086   HDL >57 mg/dL  56  46   42   Calc LDL 0 - 99 mg/dL  846  94   962   Triglycerides 0 - 149 mg/dL  952  841   324   Creatinine 0.57 - 1.00 mg/dL  4.01  0.27  2.53  6.64       06/26/2023    2:09 PM 03/18/2023    8:50 AM 12/17/2022    9:10 AM 12/17/2022    8:32 AM 12/17/2022    8:30 AM 11/25/2022    2:19 PM 09/12/2022    8:34 AM  BP/Weight  Systolic BP 121 121 144 149 146 -- 122  Diastolic BP 72 67 74 84 71 -- 70  Wt. (Lbs) 247.12 254   265 250 251.12  BMI 36.49 kg/m2 37.51 kg/m2   39.13 kg/m2 36.92 kg/m2 37.08 kg/m2      Latest Ref Rng & Units 04/17/2023   12:00 AM 09/12/2022    8:20 AM  Foot/eye exam completion dates  Eye Exam No Retinopathy No Retinopathy       Foot Form Completion   Done     This result is from an external source.    Improved continue current meds

## 2023-07-01 NOTE — Assessment & Plan Note (Signed)
 Increasing in number needs treatment refer Gyne

## 2023-07-01 NOTE — Assessment & Plan Note (Signed)
 Hyperlipidemia:Low fat diet discussed and encouraged.   Lipid Panel  Lab Results  Component Value Date   CHOL 175 06/19/2023   HDL 56 06/19/2023   LDLCALC 101 (H) 06/19/2023   TRIG 101 06/19/2023   CHOLHDL 3.1 06/19/2023     Need to reduce fat intake no med change

## 2023-07-10 ENCOUNTER — Other Ambulatory Visit: Payer: Self-pay | Admitting: Family Medicine

## 2023-07-22 ENCOUNTER — Encounter: Payer: Self-pay | Admitting: General Surgery

## 2023-07-22 ENCOUNTER — Ambulatory Visit: Admitting: General Surgery

## 2023-07-22 VITALS — BP 113/69 | HR 74 | Temp 97.6°F | Resp 18 | Ht 69.0 in | Wt 245.0 lb

## 2023-07-22 DIAGNOSIS — L729 Follicular cyst of the skin and subcutaneous tissue, unspecified: Secondary | ICD-10-CM

## 2023-07-22 DIAGNOSIS — L089 Local infection of the skin and subcutaneous tissue, unspecified: Secondary | ICD-10-CM

## 2023-07-22 MED ORDER — CLINDAMYCIN PHOS (ONCE-DAILY) 1 % EX GEL: 1.0000 | Freq: Two times a day (BID) | CUTANEOUS | 1 refills | Status: DC | PRN

## 2023-07-22 NOTE — Patient Instructions (Addendum)
 Call to schedule removal of the cyst if you decide you want to pursue this. You will need your sister to pack the opening where the cyst is removed due to the size of the cyst (4cm) and the risk of it being infected if we close it with stitches.   You have hidradenitis under your armpit and in your groin. I have prescribed the Clindamycin ointment you can try (Dr. Rue Cota can refill this as needed).   Removing a Pocket of Fluid in the Skin (Epidermoid Cyst Removal): What to Expect Epidermoid cyst removal is a procedure to remove a pocket of fluid that forms under the skin. This pocket of fluid is called an epidermoid cyst. It's filled with thick, oily substance that your skin glands make. They're usually harmless. If the cyst gets big, painful, or inflamed, your health care provider may suggest removing it. Tell a health care provider about: Any allergies you have. All medicines you take. These include vitamins, herbs, eye drops, and creams. Any problems you or family members have had with anesthesia. Any bleeding problems you have. Any surgeries you've had. Any medical conditions you have. Whether you're pregnant or may be pregnant. What are the risks? Your provider will talk with you about risks. These may include: The cyst coming back. Bleeding. Infection. Scarring. What happens before the procedure? Ask about changing or stopping: Any medicines you take. Any vitamins, herbs, or supplements you take. Do not take aspirin or ibuprofen  unless you're told to. If you were given antibiotics, take them as told. Do not stop taking them even if you start to feel better. Shower on the morning of your procedure. Your provider may ask you to wash with a soap that kills germs. What happens during the procedure?  You may be given: Anesthesia. This keeps you from feeling pain. It will numb certain areas of your body. The cyst area will be cleaned. A small cut will be made over the cyst. The  cyst will be separated from the surrounding tissues. If possible, the cyst will be taken out as a whole. If the cyst bursts, it will be taken out in pieces. Any bleeding will be controlled. The cut will be closed with stitches, if needed. Antibiotic ointment and a bandage may be put on the area. These steps may vary. Ask what you can expect. What happens after the procedure? If you were given antibiotic medicine or ointment, use them for the time you were told. Do not stop using them sooner even if you start to feel better. You will need to reapply the enzyme ointment to the wound. Your provider will show you how to reapply the ointment and the dressing. This information is not intended to replace advice given to you by your health care provider. Make sure you discuss any questions you have with your health care provider. Document Revised: 09/20/2022 Document Reviewed: 09/20/2022 Elsevier Patient Education  2024 [employer].

## 2023-07-22 NOTE — Progress Notes (Unsigned)
 [facility] History and Physical  Reason for Referral:*** Referring Physician: ***  Chief Complaint   New Patient (Initial Visit)     Theresa Adkins is a 78 y.o. female.  HPI:   Discussed the use of AI scribe software for clinical note transcription with the patient, who gave verbal consent to proceed.  History of Present Illness Theresa Adkins is a 78 year old female who presents with a recurrent cyst on her upper back. She was referred by Dr. Rodolph Clap for evaluation of a cyst on her upper back.  She has a recurrent cyst on her upper back, which she believes is the same one she had over forty years ago. At that time, it was partially removed, but she did not return for completion due to pain. Recently, the cyst has come to a head, bled, and emitted an odor approximately two to four weeks ago, but it has since improved. She was given antibiotics.  She has a history of hidradenitis suppurativa, with cysts occurring under her arms and on her labia. These cysts 'come and go' and are not as bothersome as the one on her back. She has not used topical treatments like clindamycin for these areas.  Her past medical history includes hypertension, hyperlipidemia, diabetes, and a lumpectomy for breast cancer. She has experienced recurrent infections in areas with sweat glands.  The cyst on her back has been draining and bleeding. No current pain from the cysts under her arms and labia.     ***.  The *** started *** and has had a duration of ***.  It is associated with ***.  The *** is improved with ***, and is made worse with ***.    Quality*** Context***  Past Medical History:  Diagnosis Date  . Allergic rhinitis, seasonal   . Arthritis   . Breast cancer [facility]) dx 02/ 2015---  oncologist-  dr shadad/ dr Jeryl Moris   dx Right breast upper-outer quadrant DCIS, high grade (Tis N0), ER+, PR negative--- 07-01-2013  s/p  right breast lumpectomy w/ sln bxs and radiation therapy  (08-24-2013 to 09-20-2013)  . Diabetes mellitus type II   . Diabetic neuropathy (HCC)   . Heart murmur   . Hidradenitis axillaris    chronic  . History of adenomatous polyp of colon    03-04-2008  tubular adenoma  . History of radiation therapy 08-24-2013 to 09-20-2013   total 50Gy  . Hyperlipidemia   . Hypertension   . Personal history of radiation therapy   . PMB (postmenopausal bleeding) 05/14/2016  . Thickened endometrium   . Wears dentures    upper  . Wears glasses     Past Surgical History:  Procedure Laterality Date  . BREAST BIOPSY Right 04/16/2016  . BREAST LUMPECTOMY Right 08/2013  . BREAST LUMPECTOMY WITH NEEDLE LOCALIZATION AND AXILLARY SENTINEL LYMPH NODE BX Right 07/01/2013   Procedure: BREAST LUMPECTOMY WITH NEEDLE LOCALIZATION AND AXILLARY SENTINEL LYMPH NODE BX;  Surgeon: Mayme Spearman, MD;  [facility];  Service: General;  Laterality: Right;  . BREAST SURGERY  1970s   benign tumor removed from unspecified breast   . COLONOSCOPY N/A 11/27/2018   Procedure: COLONOSCOPY;  Surgeon: Alyce Jubilee, MD;  [facility];  Service: Endoscopy;  Laterality: N/A;  8:30  . COLONOSCOPY W/ POLYPECTOMY  03/04/2008  . DILATATION & CURRETTAGE/HYSTEROSCOPY WITH RESECTOCOPE N/A 05/14/2016   Procedure: DILATATION & CURETTAGE/HYSTEROSCOPY WITH RESECTOCOPE;  Surgeon: Stevenson Elbe, MD;  Location: [facility]  [place];  Service: Gynecology;  Laterality: N/A;  . POLYPECTOMY  11/27/2018   Procedure: POLYPECTOMY;  Surgeon: Alyce Jubilee, MD;  [facility];  Service: Endoscopy;;  colon    Family History  Problem Relation Age of Onset  . Cancer Brother 65       bone   . Obesity Sister   . Heart disease Mother   . Cancer Sister 9       leukemia   . Obesity Sister     Social History   Tobacco Use  . Smoking status: Former    Current packs/day: 0.00    Average packs/day: 1 pack/day for 25.0 years (25.0 ttl pk-yrs)    Types: Cigarettes     Start date: 02/17/1977    Quit date: 02/17/2002    Years since quitting: 21.4  . Smokeless tobacco: Never  Substance Use Topics  . Alcohol use: No  . Drug use: No    Medications: {medication reviewed/display:3041432} Allergies as of 07/22/2023       Reactions   Ace Inhibitors Other (See Comments)   Cough        Medication List        Accurate as of July 22, 2023 11:05 AM. If you have any questions, ask your nurse or doctor.          STOP taking these medications    amoxicillin -clavulanate 875-125 MG tablet Commonly known as: AUGMENTIN  Stopped by: Awilda Bogus   metFORMIN  1000 MG tablet Commonly known as: GLUCOPHAGE  Stopped by: Awilda Bogus       TAKE these medications    amLODipine  10 MG tablet Commonly known as: NORVASC  Take 1 tablet by mouth once daily   aspirin EC 325 MG tablet Take 325 mg by mouth daily. Pt takes 1 daily   atorvastatin  10 MG tablet Commonly known as: LIPITOR Take 1 tablet by mouth once daily   bisoprolol -hydrochlorothiazide  5-6.25 MG tablet Commonly known as: Ziac  Take 1 tablet by mouth daily.   Blood Glucose Monitoring Suppl Devi 1 each by Does not apply route daily. May substitute to any manufacturer covered by patient's insurance.   fenofibrate  48 MG tablet Commonly known as: TRICOR  Take 1 tablet by mouth once daily   ferrous sulfate 325 (65 FE) MG tablet Take 325 mg by mouth daily with breakfast.   gabapentin  100 MG capsule Commonly known as: NEURONTIN  TAKE 1 CAPSULE BY MOUTH AT BEDTIME AS NEEDED FOR  ARM  PAIN   glucose blood test strip Use as instructed to test once daily   multivitamin tablet Take 1 tablet by mouth daily.   OneTouch Delica Lancets 33G Misc Use to test once daily.   tirzepatide  7.5 MG/0.5ML Pen Commonly known as: MOUNJARO  Inject 7.5 mg into the skin once a week.   UNABLE TO FIND Med Name: Diabetic Shoes  Dx: E11.65   valsartan  80 MG tablet Commonly known as: DIOVAN  Take 1  tablet by mouth once daily   Vitamin D3 50 MCG (2000 UT) Chew Chew 4,000 Units by mouth daily.         ROS:  {Review of Systems:30496}  Blood pressure 113/69, pulse 74, temperature 97.6 F (36.4 C), temperature source Oral, resp. rate 18, height 5\' 9"  (1.753 m), weight 245 lb (111.1 kg), SpO2 94%. Physical Exam Physical Exam GENERAL: Alert, cooperative, well developed, no acute distress HEENT: Normocephalic, normal oropharynx, moist mucous membranes CHEST: Clear to auscultation bilaterally, no wheezes, rhonchi, or crackles CARDIOVASCULAR: Normal  heart rate and rhythm, S1 and S2 normal without murmurs ABDOMEN: Soft, non-tender, non-distended, without organomegaly, normal bowel sounds EXTREMITIES: No cyanosis or edema NEUROLOGICAL: Cranial nerves grossly intact, moves all extremities without gross motor or sensory deficit SKIN: Cyst on upper back, approximately 4 cm, with drainage of old material and opening to the external environment, risk of infection  Results: No results found for this or any previous visit (from the past 48 hours).  No results found.   Assessment and Plan: Assessment and Plan Assessment & Plan Sebaceous cyst on upper back Chronic cyst, recurrent due to incomplete excision, currently draining with infection risk. Surgery is definitive but involves risks and post-op care. - Consider office-based surgical removal under local anesthesia. Schedule for Thursday afternoon in July if she agrees. - Educated on daily post-op dressing changes with family assistance. - Applied dressing to prevent drainage on clothing.  Hidradenitis suppurativa Intermittent symptoms, not severe. Topical clindamycin discussed for flare-up management. - Prescribe topical clindamycin for affected areas. - Encouraged follow-up with Dr. Dany Dyke for further evaluation and management.     Theresa Adkins is a 78 y.o. female with *** -*** -*** -Follow up ***  All questions were  answered to the satisfaction of the patient and family***.  The risk and benefits of *** were discussed including but not limited to ***.  After careful consideration, Theresa Adkins has decided to ***.    Awilda Bogus 07/22/2023, 11:05 AM

## 2023-07-30 ENCOUNTER — Telehealth: Payer: Self-pay | Admitting: Pharmacy Technician

## 2023-07-30 ENCOUNTER — Other Ambulatory Visit (HOSPITAL_COMMUNITY): Payer: Self-pay

## 2023-07-30 NOTE — Telephone Encounter (Signed)
 Pharmacy Patient Advocate Encounter   Received notification from CoverMyMeds that prior authorization for Mounjaro  7.5MG /0.5ML auto-injectors is required/requested.   Insurance verification completed.   The patient is insured through [facility] ADVANTAGE/RX ADVANCE .   Per test claim: PA required; PA submitted to above mentioned insurance via CoverMyMeds Key/confirmation #[id number] [employer] Status is pending

## 2023-07-30 NOTE — Telephone Encounter (Signed)
 Pharmacy Patient Advocate Encounter  Received notification from [facility] ADVANTAGE/RX ADVANCE that Prior Authorization for Mounjaro  7.5MG /0.5ML auto-injectors has been APPROVED from 07/30/2023 to 07/29/2024. Ran test claim, Copay is $0.00. This test claim was processed through [facility]- copay amounts may vary at other pharmacies due to pharmacy/plan contracts, or as the patient moves through the different stages of their insurance plan.   PA #/Case ID/Reference #: Theresa Adkins

## 2023-08-20 ENCOUNTER — Ambulatory Visit
Admission: RE | Admit: 2023-08-20 | Discharge: 2023-08-20 | Disposition: A | Discharge status: Home / Self Care | Source: Ambulatory Visit | Attending: Family Medicine | Admitting: Family Medicine

## 2023-08-20 DIAGNOSIS — Z1231 Encounter for screening mammogram for malignant neoplasm of breast: Secondary | ICD-10-CM | POA: Diagnosis not present

## 2023-08-31 ENCOUNTER — Other Ambulatory Visit: Payer: Self-pay | Admitting: Family Medicine

## 2023-09-03 ENCOUNTER — Ambulatory Visit: Admitting: Obstetrics & Gynecology

## 2023-09-03 ENCOUNTER — Encounter: Payer: Self-pay | Admitting: Obstetrics & Gynecology

## 2023-09-03 ENCOUNTER — Other Ambulatory Visit: Payer: Self-pay | Admitting: Family Medicine

## 2023-09-03 VITALS — BP 123/72 | HR 76 | Ht 69.0 in | Wt 248.0 lb

## 2023-09-03 DIAGNOSIS — A630 Anogenital (venereal) warts: Secondary | ICD-10-CM

## 2023-09-03 DIAGNOSIS — L732 Hidradenitis suppurativa: Secondary | ICD-10-CM

## 2023-09-03 MED ORDER — DOXYCYCLINE HYCLATE 100 MG PO TABS: 100.0000 mg | ORAL_TABLET | Freq: Two times a day (BID) | ORAL | 0 refills | Status: DC

## 2023-09-03 NOTE — Progress Notes (Signed)
 GYN VISIT Patient name: Theresa Adkins MRN [id number]  Date of birth: 26-Aug-1945 Chief Complaint:   Hidradenitis  History of Present Illness:   Theresa Adkins is a 78 y.o. G1P1001 PM female seen for consultation from primary Dr. Antonetta due to the following concerns:     Notes boils in genital area- this has been for the past 6-56mos.  Typically in armpits and will go away, but now it is on her genitals.  She notes that it is both labia as well as extending to her bottom.  Per patient it seems to have gotten progressively worse.  She had tried a 10-day course of antibiotics, with minimal improvement  Initially the patient was concerned that this was related to Mounjaro  as this all seem to occur around the same time.  Denies vaginal bleeding or discharge.  Denies pelvic or abdominal pain  No LMP recorded. Patient is postmenopausal.    Review of Systems:   Pertinent items are noted in HPI Denies fever/chills, dizziness, headaches, visual disturbances, fatigue, shortness of breath, chest pain, abdominal pain, vomiting Pertinent History Reviewed:   Past Surgical History:  Procedure Laterality Date   BREAST BIOPSY Right 04/16/2016   BREAST LUMPECTOMY Right 08/2013   BREAST LUMPECTOMY WITH NEEDLE LOCALIZATION AND AXILLARY SENTINEL LYMPH NODE BX Right 07/01/2013   Procedure: BREAST LUMPECTOMY WITH NEEDLE LOCALIZATION AND AXILLARY SENTINEL LYMPH NODE BX;  Surgeon: Deward GORMAN Curvin DOUGLAS, MD;  [facility];  Service: General;  Laterality: Right;   BREAST SURGERY  1970s   benign tumor removed from unspecified breast    COLONOSCOPY N/A 11/27/2018   Procedure: COLONOSCOPY;  Surgeon: Harvey Margo CROME, MD;  [facility];  Service: Endoscopy;  Laterality: N/A;  8:30   COLONOSCOPY W/ POLYPECTOMY  03/04/2008   DILATATION & CURRETTAGE/HYSTEROSCOPY WITH RESECTOCOPE N/A 05/14/2016   Procedure: DILATATION & CURETTAGE/HYSTEROSCOPY WITH RESECTOCOPE;  Surgeon: Shanda SHAUNNA Muscat, MD;  [facility];  Service: Gynecology;  Laterality: N/A;   POLYPECTOMY  11/27/2018   Procedure: POLYPECTOMY;  Surgeon: Harvey Margo CROME, MD;  [facility];  Service: Endoscopy;;  colon    Past Medical History:  Diagnosis Date   Allergic rhinitis, seasonal    Arthritis    Breast cancer [facility]) dx 02/ 2015---  oncologist-  dr shadad/ dr dewey   dx Right breast upper-outer quadrant DCIS, high grade (Tis N0), ER+, PR negative--- 07-01-2013  s/p  right breast lumpectomy w/ sln bxs and radiation therapy (08-24-2013 to 09-20-2013)   Diabetes mellitus type II    Diabetic neuropathy (HCC)    Heart murmur    Hidradenitis axillaris    chronic   History of adenomatous polyp of colon    03-04-2008  tubular adenoma   History of radiation therapy 08-24-2013 to 09-20-2013   total 50Gy   Hyperlipidemia    Hypertension    Personal history of radiation therapy    PMB (postmenopausal bleeding) 05/14/2016   Thickened endometrium    Wears dentures    upper   Wears glasses    Reviewed problem list, medications and allergies. Physical Assessment:   Vitals:   09/03/23 1113  BP: 123/72  Pulse: 76  Weight: 248 lb (112.5 kg)  Height: 5' 9 (1.753 m)  Body mass index is 36.62 kg/m.       Physical Examination:   General appearance: alert, well appearing, and in no distress  Psych: mood appropriate, normal affect  Skin: warm & dry  Cardiovascular: normal heart rate noted  Respiratory: normal respiratory effort, no distress  Abdomen: soft, non-tender   Pelvic: see media for pictures Multiple small abscesses noted bilateral labia majora as well as perianal area.  Each pocket of induration at most 1 cm in size.  On right labia 3 condylomas also noted  Extremities: no edema   Chaperone: Alan Fischer    Assessment & Plan:  1) HS, Condyloma - Plan to start Doxy twice daily - Okay to continue with Hibiclens  - Follow-up in 2 weeks   Meds ordered this encounter  Medications    doxycycline  (VIBRA -TABS) 100 MG tablet    Sig: Take 1 tablet (100 mg total) by mouth 2 (two) times daily.    Dispense:  60 tablet    Refill:  0      Return in about 2 weeks (around 09/17/2023) for HS follow up.   Mindie Rawdon, DO Attending Obstetrician & Gynecologist, [facility] for [employer], [facility]

## 2023-09-03 NOTE — Telephone Encounter (Unsigned)
 Copied from CRM [id number]. Topic: Clinical - Medication Refill >> Sep 03, 2023  1:14 PM Marissa P wrote: Medication: Mounjaro    Has the patient contacted their pharmacy? Yes (Agent: If no, request that the patient contact the pharmacy for the refill. If patient does not wish to contact the pharmacy document the reason why and proceed with request.) (Agent: If yes, when and what did the pharmacy advise?)  This is the patient's preferred pharmacy:  [facility] [address], [place] - [address] [place] [address] Phone: [phone] Fax: [phone]  Is this the correct pharmacy for this prescription? Yes If no, delete pharmacy and type the correct one.   Has the prescription been filled recently? Yes  Is the patient out of the medication? Yes  Has the patient been seen for an appointment in the last year OR does the patient have an upcoming appointment? Yes  Can we respond through MyChart? Yes  Agent: Please be advised that Rx refills may take up to 3 business days. We ask that you follow-up with your pharmacy.

## 2023-09-04 ENCOUNTER — Telehealth: Payer: Self-pay

## 2023-09-04 ENCOUNTER — Other Ambulatory Visit: Payer: Self-pay

## 2023-09-04 MED ORDER — TIRZEPATIDE 10 MG/0.5ML ~~LOC~~ SOAJ: 10.0000 mg | SUBCUTANEOUS | 0 refills | Status: DC

## 2023-09-04 NOTE — Telephone Encounter (Signed)
 Copied from CRM [id number]. Topic: Clinical - Medication Refill >> Sep 03, 2023  1:14 PM Marissa P wrote: Medication: Mounjaro    Has the patient contacted their pharmacy? Yes (Agent: If no, request that the patient contact the pharmacy for the refill. If patient does not wish to contact the pharmacy document the reason why and proceed with request.) (Agent: If yes, when and what did the pharmacy advise?)  This is the patient's preferred pharmacy:  [facility] [address], [place] - [address] [place] [address] Phone: [phone] Fax: [phone]  Is this the correct pharmacy for this prescription? Yes If no, delete pharmacy and type the correct one.   Has the prescription been filled recently? Yes  Is the patient out of the medication? Yes  Has the patient been seen for an appointment in the last year OR does the patient have an upcoming appointment? Yes  Can we respond through MyChart? Yes  Agent: Please be advised that Rx refills may take up to 3 business days. We ask that you follow-up with your pharmacy. >> Sep 04, 2023  9:27 AM Gustabo D wrote: Patient wants  a call back about her Monjouro I told her to f/u with pharmacy. Please call her back

## 2023-09-04 NOTE — Addendum Note (Signed)
 Addended by: ANTONETTA ROLLENE BRAVO on: 09/04/2023 09:58 AM   Modules accepted: Orders

## 2023-09-04 NOTE — Telephone Encounter (Signed)
Rx sent for 10 mg dose

## 2023-09-08 ENCOUNTER — Other Ambulatory Visit: Payer: Self-pay | Admitting: Family Medicine

## 2023-09-08 DIAGNOSIS — E785 Hyperlipidemia, unspecified: Secondary | ICD-10-CM

## 2023-09-17 ENCOUNTER — Encounter: Payer: Self-pay | Admitting: Obstetrics & Gynecology

## 2023-09-17 ENCOUNTER — Ambulatory Visit: Admitting: Obstetrics & Gynecology

## 2023-09-17 VITALS — BP 130/74 | HR 84 | Ht 69.0 in | Wt 249.8 lb

## 2023-09-17 DIAGNOSIS — I10 Essential (primary) hypertension: Secondary | ICD-10-CM | POA: Diagnosis not present

## 2023-09-17 DIAGNOSIS — L732 Hidradenitis suppurativa: Secondary | ICD-10-CM

## 2023-09-17 MED ORDER — DOXYCYCLINE HYCLATE 100 MG PO TABS: 100.0000 mg | ORAL_TABLET | Freq: Two times a day (BID) | ORAL | 3 refills | Status: AC

## 2023-09-17 NOTE — Progress Notes (Signed)
 GYN VISIT Patient name: Theresa Adkins MRN [id number]  Date of birth: 07/24/1945 Chief Complaint:   Follow-up (HS feels better)  History of Present Illness:   Theresa Adkins is a 78 y.o. G1P1001 PM female being seen today for follow up regarding:  HS/vulvar boil: Today she notes significant improvement including decreased pain.  She is still noting some areas near her bottom that she is concerned about.  She denies vaginal bleeding or discharge.  No urinary concerns.    Of note patient is still concerned that the Mounjaro  is worsening her symptoms.  Upon review of her medical records, it seems as though she was diagnosed with hidradenitis suppurativa prior to starting on Mounjaro    No LMP recorded. Patient is postmenopausal.    Review of Systems:   Pertinent items are noted in HPI Denies fever/chills, dizziness, headaches, visual disturbances, fatigue, shortness of breath, chest pain, abdominal pain, vomiting Pertinent History Reviewed:   Past Surgical History:  Procedure Laterality Date   BREAST BIOPSY Right 04/16/2016   BREAST LUMPECTOMY Right 08/2013   BREAST LUMPECTOMY WITH NEEDLE LOCALIZATION AND AXILLARY SENTINEL LYMPH NODE BX Right 07/01/2013   Procedure: BREAST LUMPECTOMY WITH NEEDLE LOCALIZATION AND AXILLARY SENTINEL LYMPH NODE BX;  Surgeon: Deward GORMAN Curvin DOUGLAS, MD;  [facility];  Service: General;  Laterality: Right;   BREAST SURGERY  1970s   benign tumor removed from unspecified breast    COLONOSCOPY N/A 11/27/2018   Procedure: COLONOSCOPY;  Surgeon: Harvey Margo CROME, MD;  [facility];  Service: Endoscopy;  Laterality: N/A;  8:30   COLONOSCOPY W/ POLYPECTOMY  03/04/2008   DILATATION & CURRETTAGE/HYSTEROSCOPY WITH RESECTOCOPE N/A 05/14/2016   Procedure: DILATATION & CURETTAGE/HYSTEROSCOPY WITH RESECTOCOPE;  Surgeon: Shanda SHAUNNA Muscat, MD;  Location: [facility];  Service: Gynecology;  Laterality: N/A;   POLYPECTOMY  11/27/2018   Procedure:  POLYPECTOMY;  Surgeon: Harvey Margo CROME, MD;  [facility];  Service: Endoscopy;;  colon    Past Medical History:  Diagnosis Date   Allergic rhinitis, seasonal    Arthritis    Breast cancer [facility]) dx 02/ 2015---  oncologist-  dr shadad/ dr dewey   dx Right breast upper-outer quadrant DCIS, high grade (Tis N0), ER+, PR negative--- 07-01-2013  s/p  right breast lumpectomy w/ sln bxs and radiation therapy (08-24-2013 to 09-20-2013)   Diabetes mellitus type II    Diabetic neuropathy (HCC)    Heart murmur    Hidradenitis axillaris    chronic   History of adenomatous polyp of colon    03-04-2008  tubular adenoma   History of radiation therapy 08-24-2013 to 09-20-2013   total 50Gy   Hyperlipidemia    Hypertension    Personal history of radiation therapy    PMB (postmenopausal bleeding) 05/14/2016   Thickened endometrium    Wears dentures    upper   Wears glasses    Reviewed problem list, medications and allergies. Physical Assessment:   Vitals:   09/17/23 1528  BP: 130/74  Pulse: 84  Weight: 249 lb 12.8 oz (113.3 kg)  Height: 5' 9 (1.753 m)  Body mass index is 36.89 kg/m.       Physical Examination:   General appearance: alert, well appearing, and in no distress  Psych: mood appropriate, normal affect  Skin: warm & dry   Cardiovascular: normal heart rate noted  Respiratory: normal respiratory effort, no distress  Abdomen: soft, non-tender   Pelvic: Prior pictures reviewed- Multiple small abscesses  noted bilateral labia majora as well as perianal area- mostly scarrning.  No discrete area of induration noted.  Right labial condyloma stable in size.  No tenderness on exam.  Extremities: no edema   Chaperone: Latisha Cresenzo    Assessment & Plan:  1) HS - Clinically significant improvement - Will plan to continue with Doxy twice daily -ok to use Clinda-gel which was previously prescribed - Reviewed HF specialist, unfortunately the closest office appears to be  [facility] -plan to f/u in 6 mos  -Based on my review of Mounjaro , I do not think this medication is complicating her at bedtime.  Reviewed that if anything better control of diabetes and weight management would improve her symptoms  Meds ordered this encounter  Medications   doxycycline  (VIBRA -TABS) 100 MG tablet    Sig: Take 1 tablet (100 mg total) by mouth 2 (two) times daily.    Dispense:  180 tablet    Refill:  3      Return in about 6 months (around 03/19/2024) for HS follow up.   Maat Kafer, DO Attending Obstetrician & Gynecologist, [facility] for [employer], [facility]

## 2023-09-26 ENCOUNTER — Other Ambulatory Visit: Payer: Self-pay | Admitting: Family Medicine

## 2023-10-09 DIAGNOSIS — I10 Essential (primary) hypertension: Secondary | ICD-10-CM | POA: Diagnosis not present

## 2023-10-09 DIAGNOSIS — E1169 Type 2 diabetes mellitus with other specified complication: Secondary | ICD-10-CM | POA: Diagnosis not present

## 2023-10-10 LAB — CMP14+EGFR
ALT: 12 IU/L (ref 0–32)
AST: 13 IU/L (ref 0–40)
Albumin: 4.2 g/dL (ref 3.8–4.8)
Alkaline Phosphatase: 46 IU/L (ref 44–121)
BUN/Creatinine Ratio: 13 (ref 12–28)
BUN: 10 mg/dL (ref 8–27)
Bilirubin Total: 0.2 mg/dL (ref 0.0–1.2)
CO2: 21 mmol/L (ref 20–29)
Calcium: 9.3 mg/dL (ref 8.7–10.3)
Chloride: 105 mmol/L (ref 96–106)
Creatinine, Ser: 0.78 mg/dL (ref 0.57–1.00)
Globulin, Total: 3 g/dL (ref 1.5–4.5)
Glucose: 105 mg/dL — ABNORMAL HIGH (ref 70–99)
Potassium: 4.3 mmol/L (ref 3.5–5.2)
Sodium: 144 mmol/L (ref 134–144)
Total Protein: 7.2 g/dL (ref 6.0–8.5)
eGFR: 78 mL/min/1.73 (ref >59)

## 2023-10-10 LAB — HEMOGLOBIN A1C
Est. average glucose Bld gHb Est-mCnc: 143 mg/dL
Hgb A1c MFr Bld: 6.6 % — ABNORMAL HIGH (ref 4.8–5.6)

## 2023-10-13 ENCOUNTER — Telehealth: Payer: Self-pay

## 2023-10-13 NOTE — Telephone Encounter (Signed)
 Copied from CRM #[id number]. Topic: Appointments - Appointment Info/Confirmation >> Oct 13, 2023  8:59 AM Turkey A wrote: Patient would like to know if her appt for tomorrow is still scheduled. A different patient told her that the Dr.Simpson was not going to be in the office. Patient would like to know for sure because she said the last time she came and was notified that the doctor would not be there.

## 2023-10-13 NOTE — Telephone Encounter (Signed)
 Done

## 2023-10-14 ENCOUNTER — Ambulatory Visit (INDEPENDENT_AMBULATORY_CARE_PROVIDER_SITE_OTHER): Admitting: Family Medicine

## 2023-10-14 ENCOUNTER — Ambulatory Visit: Payer: Self-pay | Admitting: Family Medicine

## 2023-10-14 ENCOUNTER — Other Ambulatory Visit: Payer: Self-pay | Admitting: Family Medicine

## 2023-10-14 ENCOUNTER — Encounter: Payer: Self-pay | Admitting: Family Medicine

## 2023-10-14 VITALS — BP 129/69 | HR 65 | Resp 16 | Ht 69.0 in | Wt 249.1 lb

## 2023-10-14 DIAGNOSIS — R9431 Abnormal electrocardiogram [ECG] [EKG]: Secondary | ICD-10-CM | POA: Diagnosis not present

## 2023-10-14 DIAGNOSIS — Z0001 Encounter for general adult medical examination with abnormal findings: Secondary | ICD-10-CM | POA: Insufficient documentation

## 2023-10-14 DIAGNOSIS — E559 Vitamin D deficiency, unspecified: Secondary | ICD-10-CM | POA: Diagnosis not present

## 2023-10-14 DIAGNOSIS — E782 Mixed hyperlipidemia: Secondary | ICD-10-CM | POA: Diagnosis not present

## 2023-10-14 DIAGNOSIS — I10 Essential (primary) hypertension: Secondary | ICD-10-CM | POA: Diagnosis not present

## 2023-10-14 DIAGNOSIS — E1169 Type 2 diabetes mellitus with other specified complication: Secondary | ICD-10-CM

## 2023-10-14 DIAGNOSIS — Z9189 Other specified personal risk factors, not elsewhere classified: Secondary | ICD-10-CM | POA: Diagnosis not present

## 2023-10-14 MED ORDER — TIRZEPATIDE 12.5 MG/0.5ML ~~LOC~~ SOAJ: 12.5000 mg | SUBCUTANEOUS | 0 refills | Status: DC

## 2023-10-14 NOTE — Progress Notes (Signed)
 Theresa Adkins     MRN: [id number]      DOB: 1945/12/04  Chief Complaint  Patient presents with   Annual Exam    cpe    HPI: Patient is in for annual physical exam. . Recent labs,  are reviewed. Immunization is reviewed , and  updated if needed.   PE: BP 129/69   Pulse 65   Resp 16   Ht 5' 9 (1.753 m)   Wt 249 lb 1.3 oz (113 kg)   SpO2 95%   BMI 36.78 kg/m   Pleasant  female, alert and oriented x 3, in no cardio-pulmonary distress. Afebrile. HEENT No facial trauma or asymetry. Sinuses non tender.  Extra occullar muscles intact.. External ears normal, . Neck: supple, no adenopathy,JVD or thyromegaly.No bruits.  Chest: Clear to ascultation bilaterally.No crackles or wheezes. Non tender to palpation   Cardiovascular system; Heart sounds normal,  S1 and  S2 ,no S3.  No murmur, or thrill. Apical beat not displaced Peripheral pulses normal. EKG : new RBB  Abdomen: Soft, non tender Musculoskeletal exam: Decreased though adequate ROM of spine, hips , shoulders and knees. No deformity ,swelling or crepitus noted. No muscle wasting or atrophy.   Neurologic: Cranial nerves 2 to 12 intact. Power, tone ,sensation and reflexes normal throughout. No disturbance in gait. No tremor.  Skin: Intact, no ulceration, erythema , scaling or rash noted. Pigmentation normal throughout  Psych; Normal mood and affect. Judgement and concentration normal   Assessment & Plan:  Encounter for Medicare annual examination with abnormal findings ,Annual exam as documented. Counseling done  re healthy lifestyle involving commitment to 150 minutes exercise per week, heart healthy diet, and attaining healthy weight.The importance of adequate sleep also discussed. Regular seat belt use and home safety, is also discussed. Changes in health habits are decided on by the patient with goals and time frames  set for achieving them. Immunization and cancer screening needs are specifically  addressed at this visit.   Abnormal finding on EKG New RBB in 2025, at increased risk for CVD, refer cardiology   HTN (hypertension) Controlled, no change in medication Updated EKG due to multiple cVD risks , abnormal with new RBB refer Cardiology  Type 2 diabetes mellitus with other specified complication (HCC) Diabetes associated with hypertension, hyperlipidemia, obesity, arthritis, and poor wound healing  Theresa Adkins is reminded of the importance of commitment to daily physical activity for 30 minutes or more, as able and the need to limit carbohydrate intake to 30 to 60 grams per meal to help with blood sugar control.   The need to take medication as prescribed, test blood sugar as directed, and to call between visits if there is a concern that blood sugar is uncontrolled is also discussed.   Theresa Adkins is reminded of the importance of daily foot exam, annual eye examination, and good blood sugar, blood pressure and cholesterol control.     Latest Ref Rng & Units 10/09/2023    9:44 AM 06/26/2023    3:25 PM 06/19/2023    8:56 AM 03/13/2023   10:04 AM 12/12/2022    2:41 PM  Diabetic Labs  HbA1c 4.8 - 5.6 % 6.6   6.9  7.5  8.3   Micro/Creat Ratio 0 - 29 mg/g creat  14      Chol 100 - 199 mg/dL   824  837    HDL >60 mg/dL   56  46    Calc LDL 0 -  99 mg/dL   898  94    Triglycerides 0 - 149 mg/dL   898  874    Creatinine 0.57 - 1.00 mg/dL 9.21   9.23  9.15  9.30       10/14/2023    8:19 AM 09/17/2023    3:28 PM 09/03/2023   11:13 AM 07/22/2023   10:47 AM 06/26/2023    2:09 PM 03/18/2023    8:50 AM 12/17/2022    9:10 AM  BP/Weight  Systolic BP 129 130 123 113 121 121 144  Diastolic BP 69 74 72 69 72 67 74  Wt. (Lbs) 249.08 249.8 248 245 247.12 254   BMI 36.78 kg/m2 36.89 kg/m2 36.62 kg/m2 36.18 kg/m2 36.49 kg/m2 37.51 kg/m2       Latest Ref Rng & Units 10/14/2023    8:20 AM 04/17/2023   12:00 AM  Foot/eye exam completion dates  Eye Exam No Retinopathy  No Retinopathy      Foot Form  Completion  Done      This result is from an external source.      Improving , inc mounjaro  to 12.5 mg weekly when next filling

## 2023-10-14 NOTE — Assessment & Plan Note (Addendum)
 Diabetes associated with hypertension, hyperlipidemia, obesity, arthritis, and poor wound healing  Theresa Adkins is reminded of the importance of commitment to daily physical activity for 30 minutes or more, as able and the need to limit carbohydrate intake to 30 to 60 grams per meal to help with blood sugar control.   The need to take medication as prescribed, test blood sugar as directed, and to call between visits if there is a concern that blood sugar is uncontrolled is also discussed.   Theresa Adkins is reminded of the importance of daily foot exam, annual eye examination, and good blood sugar, blood pressure and cholesterol control.     Latest Ref Rng & Units 10/09/2023    9:44 AM 06/26/2023    3:25 PM 06/19/2023    8:56 AM 03/13/2023   10:04 AM 12/12/2022    2:41 PM  Diabetic Labs  HbA1c 4.8 - 5.6 % 6.6   6.9  7.5  8.3   Micro/Creat Ratio 0 - 29 mg/g creat  14      Chol 100 - 199 mg/dL   824  837    HDL >60 mg/dL   56  46    Calc LDL 0 - 99 mg/dL   898  94    Triglycerides 0 - 149 mg/dL   898  874    Creatinine 0.57 - 1.00 mg/dL 9.21   9.23  9.15  9.30       10/14/2023    8:19 AM 09/17/2023    3:28 PM 09/03/2023   11:13 AM 07/22/2023   10:47 AM 06/26/2023    2:09 PM 03/18/2023    8:50 AM 12/17/2022    9:10 AM  BP/Weight  Systolic BP 129 130 123 113 121 121 144  Diastolic BP 69 74 72 69 72 67 74  Wt. (Lbs) 249.08 249.8 248 245 247.12 254   BMI 36.78 kg/m2 36.89 kg/m2 36.62 kg/m2 36.18 kg/m2 36.49 kg/m2 37.51 kg/m2       Latest Ref Rng & Units 10/14/2023    8:20 AM 04/17/2023   12:00 AM  Foot/eye exam completion dates  Eye Exam No Retinopathy  No Retinopathy      Foot Form Completion  Done      This result is from an external source.      Improving , inc mounjaro  to 12.5 mg weekly when next filling

## 2023-10-14 NOTE — Assessment & Plan Note (Addendum)
 New RBB in 2025, at increased risk for CVD, refer cardiology

## 2023-10-14 NOTE — Assessment & Plan Note (Signed)
,  Annual exam as documented. Counseling done  re healthy lifestyle involving commitment to 150 minutes exercise per week, heart healthy diet, and attaining healthy weight.The importance of adequate sleep also discussed. Regular seat belt use and home safety, is also discussed. Changes in health habits are decided on by the patient with goals and time frames  set for achieving them. Immunization and cancer screening needs are specifically addressed at this visit.  

## 2023-10-14 NOTE — Assessment & Plan Note (Signed)
 Controlled, no change in medication Updated EKG due to multiple cVD risks , abnormal with new RBB refer Cardiology

## 2023-10-14 NOTE — Patient Instructions (Addendum)
 F/U mid to end January  PLEASE SCHEDULE FOR  FLU VACCINE AT CHECKOUT  EKG today  Nurse pls print recent labs for pt and hand them to her  Check [facility] , if on your list and they supply shows let us  know, so youcan be referred there or indeed to any podiATRIST WHO WILL FIT YOU WITH SHOES  THAT IS ON YOUR LIST  HIGHER DOSE MOUNJARO  TO BE STARTED WHEN NEXT FILLING, 12.5 MG WEEKLY  FASTING LIPID, CMP AND EGFR, HBA1C, TSH AND VIT D AND CBC 5 TO 7 DAYS BEFORE NEXT APPOINTMENT  YOU ARE REFERRED FOR CORONARY CALCIUM  SCAN,   As an addenedum I called and spoke with pt re abn EKG and am referring her to cardiology for evalauation and will get cardiac scan cancelled  Thanks for choosing [facility], we consider it a privelige to serve you.

## 2023-11-26 ENCOUNTER — Ambulatory Visit: Payer: PPO

## 2023-11-26 VITALS — Ht 69.0 in | Wt 250.0 lb

## 2023-11-26 DIAGNOSIS — Z0000 Encounter for general adult medical examination without abnormal findings: Secondary | ICD-10-CM | POA: Diagnosis not present

## 2023-11-26 NOTE — Progress Notes (Signed)
 Please attest and cosign this visit due to patients primary care provider not being in the office at the time the visit was completed.   Subjective:   Theresa Adkins is a 78 y.o. who presents for a Medicare Wellness preventive visit.  As a reminder, Annual Wellness Visits don't include a physical exam, and some assessments may be limited, especially if this visit is performed virtually. We may recommend an in-person follow-up visit with your provider if needed.  Visit Complete: Virtual I connected with  Theresa Adkins on 11/26/23 by a audio enabled telemedicine application and verified that I am speaking with the correct person using two identifiers.  Patient Location: Home  Provider Location: Home Office  I discussed the limitations of evaluation and management by telemedicine. The patient expressed understanding and agreed to proceed.  Vital Signs: Because this visit was a virtual/telehealth visit, some criteria may be missing or patient reported. Any vitals not documented were not able to be obtained and vitals that have been documented are patient reported.  VideoDeclined- This patient declined Librarian, academic. Therefore the visit was completed with audio only.  Persons Participating in Visit: Patient.  AWV Questionnaire: No: Patient Medicare AWV questionnaire was not completed prior to this visit.  Cardiac Risk Factors include: advanced age (>78men, >33 women);diabetes mellitus;dyslipidemia;hypertension;obesity (BMI >30kg/m2);sedentary lifestyle     Objective:    Today's Vitals   11/26/23 1324  Weight: 250 lb (113.4 kg)  Height: 5' 9 (1.753 m)  PainSc: 0-No pain   Body mass index is 36.92 kg/m.     11/26/2023    1:15 PM 11/25/2022    2:19 PM 11/08/2021    3:05 PM 10/03/2020    9:29 AM 09/28/2019    8:50 AM 11/27/2018    7:43 AM 09/22/2017    9:31 AM  Advanced Directives  Does Patient Have a Medical Advance Directive? No No No No No No No    Would patient like information on creating a medical advance directive? No - Patient declined No - Patient declined No - Patient declined No - Patient declined No - Patient declined No - Patient declined Yes (ED - Information included in AVS)      Data saved with a previous flowsheet row definition    Current Medications (verified) Outpatient Encounter Medications as of 11/26/2023  Medication Sig   amLODipine  (NORVASC ) 10 MG tablet Take 1 tablet by mouth once daily   aspirin EC 81 MG tablet Take 81 mg by mouth daily. Swallow whole.   atorvastatin  (LIPITOR) 10 MG tablet Take 1 tablet by mouth once daily   bisoprolol -hydrochlorothiazide  (ZIAC ) 5-6.25 MG tablet Take 1 tablet by mouth once daily   Blood Glucose Monitoring Suppl DEVI 1 each by Does not apply route daily. May substitute to any manufacturer covered by patient's insurance.   Cholecalciferol (VITAMIN D3) 50 MCG (2000 UT) CHEW Chew 4,000 Units by mouth daily.    Clindamycin  Phos, Once-Daily, (CLINDAGEL) 1 % GEL Apply 1 Application topically 2 (two) times daily as needed (hidradenitis areas under the arm and in the groin).   doxycycline  (VIBRA -TABS) 100 MG tablet Take 1 tablet (100 mg total) by mouth 2 (two) times daily.   fenofibrate  (TRICOR ) 48 MG tablet Take 1 tablet by mouth once daily   ferrous sulfate 325 (65 FE) MG tablet Take 325 mg by mouth daily with breakfast.   gabapentin  (NEURONTIN ) 100 MG capsule TAKE 1 CAPSULE BY MOUTH AT BEDTIME AS NEEDED FOR  ARM  PAIN   glucose blood test strip Use as instructed to test once daily   Multiple Vitamin (MULTIVITAMIN) tablet Take 1 tablet by mouth daily.   OneTouch Delica Lancets 33G MISC Use to test once daily.   [START ON 12/01/2023] tirzepatide  (MOUNJARO ) 12.5 MG/0.5ML Pen Inject 12.5 mg into the skin once a week.   UNABLE TO FIND Med Name: Diabetic Shoes  Dx: E11.65   valsartan  (DIOVAN ) 80 MG tablet Take 1 tablet by mouth once daily   [DISCONTINUED] aspirin 325 MG EC tablet Take  325 mg by mouth daily. Pt takes 1 daily   No facility-administered encounter medications on file as of 11/26/2023.    Allergies (verified) Ace inhibitors   History: Past Medical History:  Diagnosis Date   Allergic rhinitis, seasonal    Arthritis    Breast cancer [facility]) dx 02/ 2015---  oncologist-  dr shadad/ dr dewey   dx Right breast upper-outer quadrant DCIS, high grade (Tis N0), ER+, PR negative--- 07-01-2013  s/p  right breast lumpectomy w/ sln bxs and radiation therapy (08-24-2013 to 09-20-2013)   Diabetes mellitus type II    Diabetic neuropathy (HCC)    Heart murmur    Hidradenitis axillaris    chronic   History of adenomatous polyp of colon    03-04-2008  tubular adenoma   History of radiation therapy 08-24-2013 to 09-20-2013   total 50Gy   Hyperlipidemia    Hypertension    Personal history of radiation therapy    PMB (postmenopausal bleeding) 05/14/2016   Thickened endometrium    Wears dentures    upper   Wears glasses    Past Surgical History:  Procedure Laterality Date   BREAST BIOPSY Right 04/16/2016   BREAST LUMPECTOMY Right 08/2013   BREAST LUMPECTOMY WITH NEEDLE LOCALIZATION AND AXILLARY SENTINEL LYMPH NODE BX Right 07/01/2013   Procedure: BREAST LUMPECTOMY WITH NEEDLE LOCALIZATION AND AXILLARY SENTINEL LYMPH NODE BX;  Surgeon: Deward GORMAN Curvin DOUGLAS, MD;  Location: [facility] OR;  Service: General;  Laterality: Right;   BREAST SURGERY  1970s   benign tumor removed from unspecified breast    COLONOSCOPY N/A 11/27/2018   Procedure: COLONOSCOPY;  Surgeon: Harvey Margo CROME, MD;  Location: [facility] END[facility];  Service: Endoscopy;  Laterality: N/A;  8:30   COLONOSCOPY W/ POLYPECTOMY  03/04/2008   DILATATION & CURRETTAGE/HYSTEROSCOPY WITH RESECTOCOPE N/A 05/14/2016   Procedure: DILATATION & CURETTAGE/HYSTEROSCOPY WITH RESECTOCOPE;  Surgeon: Shanda SHAUNNA Muscat, MD;  Location: [facility];  Service: Gynecology;  Laterality: N/A;   POLYPECTOMY  11/27/2018   Procedure:  POLYPECTOMY;  Surgeon: Harvey Margo CROME, MD;  Location: [facility];  Service: Endoscopy;;  colon   Family History  Problem Relation Age of Onset   Heart disease Mother    Obesity Sister    Leukemia Sister 9   Obesity Sister    Bone cancer Brother 14   Breast cancer Neg Hx    BRCA 1/2 Neg Hx    Social History   Socioeconomic History   Marital status: Divorced    Spouse name: Not on file   Number of children: 1   Years of education: Not on file   Highest education level: Not on file  Occupational History   Occupation: Employed   Tobacco Use   Smoking status: Former    Current packs/day: 0.00    Average packs/day: 1 pack/day for 25.0 years (25.0 ttl pk-yrs)    Types: Cigarettes    Start date: 02/17/1977  Quit date: 02/17/2002    Years since quitting: 21.7   Smokeless tobacco: Never  Substance and Sexual Activity   Alcohol use: No   Drug use: No   Sexual activity: Never    Birth control/protection: None  Other Topics Concern   Not on file  Social History Narrative   Not on file   Social Drivers of Health   Financial Resource Strain: Low Risk  (11/26/2023)   Overall Financial Resource Strain (CARDIA)    Difficulty of Paying Living Expenses: Not hard at all  Food Insecurity: No Food Insecurity (11/26/2023)   Hunger Vital Sign    Worried About Running Out of Food in the Last Year: Never true    Ran Out of Food in the Last Year: Never true  Transportation Needs: No Transportation Needs (11/26/2023)   PRAPARE - Administrator, Civil Service (Medical): No    Lack of Transportation (Non-Medical): No  Physical Activity: Inactive (11/26/2023)   Exercise Vital Sign    Days of Exercise per Week: 0 days    Minutes of Exercise per Session: 0 min  Stress: No Stress Concern Present (11/26/2023)   [employer] of Occupational Health - Occupational Stress Questionnaire    Feeling of Stress: Not at all  Social Connections: Socially Isolated (11/26/2023)   Social  Connection and Isolation Panel    Frequency of Communication with Friends and Family: More than three times a week    Frequency of Social Gatherings with Friends and Family: More than three times a week    Attends Religious Services: Never    Database administrator or Organizations: No    Attends Engineer, structural: Never    Marital Status: Divorced    Tobacco Counseling Counseling given: Yes    Clinical Intake:  Pre-visit preparation completed: Yes  Pain : No/denies pain Pain Score: 0-No pain     BMI - recorded: 36.92 Nutritional Status: BMI > 30  Obese Nutritional Risks: None Diabetes: Yes CBG done?: No (telehealth visit.) Did pt. bring in CBG monitor from home?: No  Lab Results  Component Value Date   HGBA1C 6.6 (H) 10/09/2023   HGBA1C 6.9 (H) 06/19/2023   HGBA1C 7.5 (H) 03/13/2023     How often do you need to have someone help you when you read instructions, pamphlets, or other written materials from your doctor or pharmacy?: 1 - Never  Interpreter Needed?: No  Information entered by :: Mallerie Blok W CMA (AAMA)   Activities of Daily Living     11/26/2023    1:45 PM  In your present state of health, do you have any difficulty performing the following activities:  Hearing? 0  Vision? 0  Difficulty concentrating or making decisions? 0  Walking or climbing stairs? 0  Dressing or bathing? 0  Doing errands, shopping? 0  Preparing Food and eating ? N  Using the Toilet? N  In the past six months, have you accidently leaked urine? N  Do you have problems with loss of bowel control? N  Managing your Medications? N  Managing your Finances? N  Housekeeping or managing your Housekeeping? N    Patient Care Team: Antonetta Rollene BRAVO, MD as PCP - General (Family Medicine) Rothbart, Lamar HERO, MD as Consulting Physician (Cardiology) Marilynn Nest, DO as Consulting Physician (Obstetrics and Gynecology) Kallie Manuelita BROCKS, MD as Consulting Physician (General  Surgery) Leila Bound, OD (Optometry)  I have updated your Care Teams any recent Medical Services you may  have received from other providers in the past year.     Assessment:   This is a routine wellness examination for [place].  Hearing/Vision screen Hearing Screening - Comments:: Patient denies any hearing difficulties.   Vision Screening - Comments:: Wears rx glasses - up to date with routine eye exams with  Inocente Pao with [facility]   Goals Addressed               This Visit's Progress     I'd like to lose 50 lbs (pt-stated)          Depression Screen     11/26/2023    1:49 PM 10/14/2023    8:21 AM 06/26/2023    2:11 PM 03/18/2023    8:52 AM 12/17/2022    8:32 AM 11/25/2022    2:23 PM 09/12/2022    8:37 AM  PHQ 2/9 Scores  PHQ - 2 Score 0 1 0 0 2 0 1  PHQ- 9 Score 0    3 0 3     Fall Risk     11/26/2023    1:44 PM 10/14/2023    8:21 AM 07/22/2023   10:47 AM 06/26/2023    2:11 PM 03/18/2023    8:51 AM  Fall Risk   Falls in the past year? 1 1 0 1 0  Number falls in past yr: 1 1 0 0 0  Injury with Fall? 1 0  0 0  Risk for fall due to : History of fall(s);Impaired balance/gait;Impaired mobility  No Fall Risks  No Fall Risks  Follow up Falls evaluation completed;Falls prevention discussed;Education provided Falls evaluation completed Falls evaluation completed Falls evaluation completed Falls evaluation completed    MEDICARE RISK AT HOME:  Medicare Risk at Home Any stairs in or around the home?: Yes If so, are there any without handrails?: Yes Home free of loose throw rugs in walkways, pet beds, electrical cords, etc?: Yes Adequate lighting in your home to reduce risk of falls?: Yes Life alert?: No Use of a cane, walker or w/c?: No Grab bars in the bathroom?: No Shower chair or bench in shower?: No Elevated toilet seat or a handicapped toilet?: Yes  TIMED UP AND GO:  Was the test performed?  No  Cognitive Function: 6CIT completed     11/08/2021    3:07 PM 04/28/2014    8:06 AM  MMSE - Mini Mental State Exam  Not completed: Unable to complete   Orientation to time  5   Orientation to Place  5   Registration  3   Attention/ Calculation  5   Recall  3   Language- name 2 objects  2   Language- repeat  1  Language- follow 3 step command  3   Language- read & follow direction  1   Write a sentence  1   Copy design  1   Total score  30      Data saved with a previous flowsheet row definition        11/26/2023    1:49 PM 11/25/2022    2:22 PM 11/08/2021    3:07 PM 10/03/2020    9:37 AM 09/28/2019    8:52 AM  6CIT Screen  What Year? 0 points 0 points 0 points 0 points 0 points  What month? 0 points 0 points 0 points 0 points 0 points  What time? 0 points 0 points 0 points 0 points 0 points  Count back from  20 0 points 0 points 0 points 0 points 0 points  Months in reverse 0 points 0 points 0 points  0 points  Repeat phrase 0 points 0 points 0 points  0 points  Total Score 0 points 0 points 0 points  0 points    Immunizations Immunization History  Administered Date(s) Administered   Fluad Quad(high Dose 65+) 11/08/2019, 12/11/2020, 11/30/2021   Fluad Trivalent(High Dose 65+) 12/17/2022   Influenza Split 01/13/2012, 11/10/2018   Influenza,inj,Quad PF,6+ Mos 11/13/2012, 11/25/2013, 11/01/2014, 12/21/2015, 12/03/2016, 12/05/2017   Influenza-Unspecified 12/21/2015   PFIZER(Purple Top)SARS-COV-2 Vaccination 04/11/2019, 05/05/2019, 01/24/2020   Pneumococcal Conjugate-13 12/29/2014   Pneumococcal Polysaccharide-23 01/19/2016   Pneumococcal-Unspecified 01/19/2016   Tdap 06/05/2010, 09/10/2020   Zoster Recombinant(Shingrix) 06/22/2021, 09/28/2021   Zoster, Live 06/05/2010   Zoster, Unspecified 06/05/2010    Screening Tests Health Maintenance  Topic Date Due   Influenza Vaccine  09/19/2023   HEMOGLOBIN A1C  04/10/2024   OPHTHALMOLOGY EXAM  04/16/2024   Diabetic kidney evaluation - Urine ACR  06/25/2024    Mammogram  08/19/2024   Diabetic kidney evaluation - eGFR measurement  10/08/2024   FOOT EXAM  10/13/2024   Medicare Annual Wellness (AWV)  11/25/2024   DEXA SCAN  03/24/2028   DTaP/Tdap/Td (3 - Td or Tdap) 09/11/2030   Pneumococcal Vaccine: 50+ Years  Completed   Hepatitis C Screening  Completed   Zoster Vaccines- Shingrix  Completed   Meningococcal B Vaccine  Aged Out   Colonoscopy  Discontinued   COVID-19 Vaccine  Discontinued    Health Maintenance Health Maintenance Due  Topic Date Due   Influenza Vaccine  09/19/2023   Health Maintenance Items Addressed: Routine health screenings are up to date. Patient is aware of recommended vaccines.   Additional Screening:  Vision Screening: Recommended annual ophthalmology exams for early detection of glaucoma and other disorders of the eye. Patient up to date with exams  Dental Screening: Recommended annual dental exams for proper oral hygiene  Community Resource Referral / Chronic Care Management: CRR required this visit?  No   CCM required this visit?  No   Plan:    I have personally reviewed and noted the following in the patient's chart:   Medical and social history Use of alcohol, tobacco or illicit drugs  Current medications and supplements including opioid prescriptions. Patient is not currently taking opioid prescriptions. Functional ability and status Nutritional status Physical activity Advanced directives List of other physicians Hospitalizations, surgeries, and ER visits in previous 12 months Vitals Screenings to include cognitive, depression, and falls Referrals and appointments  In addition, I have reviewed and discussed with patient certain preventive protocols, quality metrics, and best practice recommendations. A written personalized care plan for preventive services as well as general preventive health recommendations were provided to patient.   Markham Dumlao, CMA   11/26/2023   After Visit Summary:  (MyChart) Due to this being a telephonic visit, the after visit summary with patients personalized plan was offered to patient via MyChart   Notes: Nothing significant to report at this time.

## 2023-11-26 NOTE — Patient Instructions (Signed)
 Ms. Sardo,  Thank you for taking the time for your Medicare Wellness Visit. I appreciate your continued commitment to your health goals. Please review the care plan we discussed, and feel free to reach out if I can assist you further.  Medicare recommends these wellness visits once per year to help you and your care team stay ahead of potential health issues. These visits are designed to focus on prevention, allowing your provider to concentrate on managing your acute and chronic conditions during your regular appointments.  Please note that Annual Wellness Visits do not include a physical exam. Some assessments may be limited, especially if the visit was conducted virtually. If needed, we may recommend a separate in-person follow-up with your provider.  Wishing you excellent health and many blessings in the year to come!  -Alleah Dearman, CMA  Ongoing Care Seeing your primary care provider every 3 to 6 months helps us  monitor your health and provide consistent, personalized care.   Recommended Screenings:  Health Maintenance  Topic Date Due   Flu Shot  09/19/2023   Hemoglobin A1C  04/10/2024   Eye exam for diabetics  04/16/2024   Yearly kidney health urinalysis for diabetes  06/25/2024   Breast Cancer Screening  08/19/2024   Yearly kidney function blood test for diabetes  10/08/2024   Complete foot exam   10/13/2024   Medicare Annual Wellness Visit  11/25/2024   DEXA scan (bone density measurement)  03/24/2028   DTaP/Tdap/Td vaccine (3 - Td or Tdap) 09/11/2030   Pneumococcal Vaccine for age over 63  Completed   Hepatitis C Screening  Completed   Zoster (Shingles) Vaccine  Completed   Meningitis B Vaccine  Aged Out   Colon Cancer Screening  Discontinued   COVID-19 Vaccine  Discontinued       11/26/2023    1:15 PM  Advanced Directives  Does Patient Have a Medical Advance Directive? No  Would patient like information on creating a medical advance directive? No - Patient declined   Advance  Care Planning is important because it: Ensures you receive medical care that aligns with your values, goals, and preferences. Provides guidance to your family and loved ones, reducing the emotional burden of decision-making during critical moments.  Vision: Annual vision screenings are recommended for early detection of glaucoma, cataracts, and diabetic retinopathy. These exams can also reveal signs of chronic conditions such as diabetes and high blood pressure.  Dental: Annual dental screenings help detect early signs of oral cancer, gum disease, and other conditions linked to overall health, including heart disease and diabetes.  Please see the attached documents for additional preventive care recommendations.

## 2023-12-02 ENCOUNTER — Ambulatory Visit (HOSPITAL_COMMUNITY)
Admission: RE | Admit: 2023-12-02 | Discharge: 2023-12-02 | Disposition: A | Discharge status: Home / Self Care | Payer: Self-pay | Source: Ambulatory Visit | Attending: Family Medicine | Admitting: Family Medicine

## 2023-12-02 DIAGNOSIS — Z9189 Other specified personal risk factors, not elsewhere classified: Secondary | ICD-10-CM | POA: Insufficient documentation

## 2023-12-06 ENCOUNTER — Other Ambulatory Visit: Payer: Self-pay | Admitting: Family Medicine

## 2023-12-06 DIAGNOSIS — E785 Hyperlipidemia, unspecified: Secondary | ICD-10-CM

## 2023-12-12 ENCOUNTER — Ambulatory Visit (INDEPENDENT_AMBULATORY_CARE_PROVIDER_SITE_OTHER)

## 2023-12-12 DIAGNOSIS — Z23 Encounter for immunization: Secondary | ICD-10-CM | POA: Diagnosis not present

## 2023-12-12 NOTE — Progress Notes (Signed)
 Patient is in office today for a nurse visit for Immunization. Patient Injection was given in the  Left deltoid. Patient tolerated injection well.

## 2023-12-22 ENCOUNTER — Other Ambulatory Visit: Payer: Self-pay | Admitting: Family Medicine

## 2023-12-30 ENCOUNTER — Ambulatory Visit: Payer: Self-pay | Admitting: Family Medicine

## 2023-12-30 NOTE — Telephone Encounter (Signed)
 FYI Only or Action Required?: Action required by provider: medication refill request and clinical question for provider.  Patient was last seen in primary care on 10/14/2023 by Antonetta Rollene BRAVO, MD.  Called Nurse Triage reporting Arm Swelling.  Symptoms began several days ago.  Interventions attempted: Nothing.  Symptoms are: unchanged.  Triage Disposition: See Physician Within 24 Hours  Patient/caregiver understands and will follow disposition?: No, wishes to speak with PCP   Copied from CRM #[id number]. Topic: Clinical - Red Word Triage >> Dec 30, 2023  1:31 PM Rosaria BRAVO wrote: Red Word that prompted transfer to Nurse Triage: Left middle finger swollen and red Reason for Disposition  MODERATE hand swelling (e.g., visible swelling of hand and fingers; pitting edema)  Answer Assessment - Initial Assessment Questions No available appts. Patient declines appts and UC.   Patient requesting gout medication.  Advised UC/ED if symptoms worsen.  1. ONSET: When did the swelling start? (e.g., minutes, hours, days)     Monday 2. LOCATION: What part of the hand is swollen?  Are both hands swollen or just one hand?     Left, middle finger 3. TYPE : What does it look like? (e.g., ball, lump; localized; hand swelling)     Redness, swelling 4. SWELLING SEVERITY: If more than a lump or localized, ask: How bad is the hand swelling? (e.g., mild, moderate, severe; describe)     moderate 5. REDNESS: Is there redness or signs of infection?     yes 6. PAIN: Is the swelling painful to touch? If Yes, ask: How painful is it?   (Scale 1-10; mild, moderate or severe)     5/10;  7. FEVER: Do you have a fever? If Yes, ask: What is it, how was it measured, and when did it start?      denies 8. CAUSE: What do you think is causing the hand swelling? (e.g., heat, insect bite, pregnancy, recent injury)     Gout flare up 9. MEDICAL HISTORY: Do you have a history of heart failure,  kidney disease, liver failure, or cancer?     Gout flare up 11. OTHER SYMPTOMS: Do you have any other symptoms? (e.g., blurred vision, difficulty breathing, headache)       Denies numbness/ tingling, or other  Protocols used: Hand Swelling-A-AH

## 2023-12-31 ENCOUNTER — Ambulatory Visit: Payer: Self-pay

## 2023-12-31 ENCOUNTER — Other Ambulatory Visit: Payer: Self-pay

## 2023-12-31 MED ORDER — INDOMETHACIN 25 MG PO CAPS: ORAL_CAPSULE | ORAL | 0 refills | Status: AC

## 2023-12-31 MED ORDER — INDOMETHACIN 25 MG PO CAPS: ORAL_CAPSULE | ORAL | 0 refills | Status: DC

## 2023-12-31 NOTE — Telephone Encounter (Signed)
 3 day course of indomethacin is prescribed pls let her know and fax  to pharmacy, would not gop through e;ectronically

## 2023-12-31 NOTE — Telephone Encounter (Signed)
 Noted

## 2023-12-31 NOTE — Telephone Encounter (Signed)
 FYI Only or Action Required?: FYI only for provider: appointment scheduled on 1/13/20205 at 9:20am with Dr Suzzane Blanch .  Patient was last seen in primary care on 10/14/2023 by Antonetta Rollene BRAVO, MD.  Called Nurse Triage reporting Hand Pain.  Symptoms began several days ago.  Interventions attempted: Nothing.  Symptoms are: unchanged.  Triage Disposition: See PCP When Office is Open (Within 3 Days)  Patient/caregiver understands and will follow disposition?: Yes                    Copied from CRM #[id number]. Topic: Clinical - Red Word Triage >> Dec 31, 2023  8:04 AM Antwanette L wrote: Red Word that prompted transfer to Nurse Triage: The patient reports gout symptoms affecting the middle finger of the hand. She describes swelling, redness, and  pain in the affected area. Reason for Disposition  [1] MODERATE pain (e.g., interferes with normal activities) AND [2] present > 3 days  Protocols used: Finger Pain-A-AH

## 2023-12-31 NOTE — Telephone Encounter (Signed)
Pt informed

## 2024-01-01 ENCOUNTER — Encounter: Payer: Self-pay | Admitting: Internal Medicine

## 2024-01-01 ENCOUNTER — Ambulatory Visit: Admitting: Internal Medicine

## 2024-01-01 VITALS — BP 132/72 | HR 81 | Ht 69.0 in | Wt 255.6 lb

## 2024-01-01 DIAGNOSIS — E1140 Type 2 diabetes mellitus with diabetic neuropathy, unspecified: Secondary | ICD-10-CM | POA: Diagnosis not present

## 2024-01-01 DIAGNOSIS — L03012 Cellulitis of left finger: Secondary | ICD-10-CM

## 2024-01-01 NOTE — Assessment & Plan Note (Signed)
 Continue gabapentin  100 mg nightly for neuropathy Referred to podiatry for diabetic shoes evaluation

## 2024-01-01 NOTE — Patient Instructions (Signed)
 Please continue taking Doxycycline  as prescribed.  Please soak finger in warm water  for about 5-10 minutes.

## 2024-01-01 NOTE — Progress Notes (Signed)
 Acute Office Visit  Subjective:    Patient ID: Theresa Adkins, female    DOB: [birth year], 78 y.o.   MRN: [id number]  Chief Complaint  Patient presents with   Hand Pain    Pt reports left middle finger pain , has swelling and redness ongoing for 3-4 days. Has hx of gout.    HPI Patient is in today for complaint of left middle finger swelling and redness near the fingernail for the last 3 days.  She has mild stinging sensation, but denies overt pain currently.  She has a history of gout, and has tried taking indomethacin since yesterday.  Denies any fever or chills.  Denies any recent injury, but reports she has a habit of scratching the skin near the fingernails.  She also reports history of diabetic neuropathy, and has trouble with her regular shoes.  She requests podiatry referral for evaluation of custom diabetic shoes.  Past Medical History:  Diagnosis Date   Allergic rhinitis, seasonal    Arthritis    Breast cancer [facility]) dx 02/ 2015---  oncologist-  dr shadad/ dr dewey   dx Right breast upper-outer quadrant DCIS, high grade (Tis N0), ER+, PR negative--- 07-01-2013  s/p  right breast lumpectomy w/ sln bxs and radiation therapy (08-24-2013 to 09-20-2013)   Diabetes mellitus type II    Diabetic neuropathy (HCC)    Heart murmur    Hidradenitis axillaris    chronic   History of adenomatous polyp of colon    03-04-2008  tubular adenoma   History of radiation therapy 08-24-2013 to 09-20-2013   total 50Gy   Hyperlipidemia    Hypertension    Personal history of radiation therapy    PMB (postmenopausal bleeding) 05/14/2016   Thickened endometrium    Wears dentures    upper   Wears glasses     Past Surgical History:  Procedure Laterality Date   BREAST BIOPSY Right 04/16/2016   BREAST LUMPECTOMY Right 08/2013   BREAST LUMPECTOMY WITH NEEDLE LOCALIZATION AND AXILLARY SENTINEL LYMPH NODE BX Right 07/01/2013   Procedure: BREAST LUMPECTOMY WITH NEEDLE LOCALIZATION AND AXILLARY  SENTINEL LYMPH NODE BX;  Surgeon: Deward GORMAN Curvin DOUGLAS, MD;  Location: [facility] OR;  Service: General;  Laterality: Right;   BREAST SURGERY  1970s   benign tumor removed from unspecified breast    COLONOSCOPY N/A 11/27/2018   Procedure: COLONOSCOPY;  Surgeon: Harvey Margo CROME, MD;  Location: [facility];  Service: Endoscopy;  Laterality: N/A;  8:30   COLONOSCOPY W/ POLYPECTOMY  03/04/2008   DILATATION & CURRETTAGE/HYSTEROSCOPY WITH RESECTOCOPE N/A 05/14/2016   Procedure: DILATATION & CURETTAGE/HYSTEROSCOPY WITH RESECTOCOPE;  Surgeon: Shanda SHAUNNA Muscat, MD;  Location: [facility];  Service: Gynecology;  Laterality: N/A;   POLYPECTOMY  11/27/2018   Procedure: POLYPECTOMY;  Surgeon: Harvey Margo CROME, MD;  Location: [facility];  Service: Endoscopy;;  colon    Family History  Problem Relation Age of Onset   Heart disease Mother    Obesity Sister    Leukemia Sister 9   Obesity Sister    Bone cancer Brother 69   Breast cancer Neg Hx    BRCA 1/2 Neg Hx     Social History   Socioeconomic History   Marital status: Divorced    Spouse name: Not on file   Number of children: 1   Years of education: Not on file   Highest education level: Not on file  Occupational History   Occupation: Employed  Tobacco Use   Smoking status: Former    Current packs/day: 0.00    Average packs/day: 1 pack/day for 25.0 years (25.0 ttl pk-yrs)    Types: Cigarettes    Start date: 02/17/1977    Quit date: 02/17/2002    Years since quitting: 21.8   Smokeless tobacco: Never  Substance and Sexual Activity   Alcohol use: No   Drug use: No   Sexual activity: Never    Birth control/protection: None  Other Topics Concern   Not on file  Social History Narrative   Not on file   Social Drivers of Health   Financial Resource Strain: Low Risk  (11/26/2023)   Overall Financial Resource Strain (CARDIA)    Difficulty of Paying Living Expenses: Not hard at all  Food Insecurity: No Food Insecurity (11/26/2023)    Hunger Vital Sign    Worried About Running Out of Food in the Last Year: Never true    Ran Out of Food in the Last Year: Never true  Transportation Needs: No Transportation Needs (11/26/2023)   PRAPARE - Administrator, Civil Service (Medical): No    Lack of Transportation (Non-Medical): No  Physical Activity: Inactive (11/26/2023)   Exercise Vital Sign    Days of Exercise per Week: 0 days    Minutes of Exercise per Session: 0 min  Stress: No Stress Concern Present (11/26/2023)   [employer] of Occupational Health - Occupational Stress Questionnaire    Feeling of Stress: Not at all  Social Connections: Socially Isolated (11/26/2023)   Social Connection and Isolation Panel    Frequency of Communication with Friends and Family: More than three times a week    Frequency of Social Gatherings with Friends and Family: More than three times a week    Attends Religious Services: Never    Database Administrator or Organizations: No    Attends Banker Meetings: Never    Marital Status: Divorced  Catering Manager Violence: Not At Risk (11/26/2023)   Humiliation, Afraid, Rape, and Kick questionnaire    Fear of Current or Ex-Partner: No    Emotionally Abused: No    Physically Abused: No    Sexually Abused: No    Outpatient Medications Prior to Visit  Medication Sig Dispense Refill   amLODipine  (NORVASC ) 10 MG tablet Take 1 tablet by mouth once daily 90 tablet 0   aspirin EC 81 MG tablet Take 81 mg by mouth daily. Swallow whole.     atorvastatin  (LIPITOR) 10 MG tablet Take 1 tablet by mouth once daily 90 tablet 0   bisoprolol -hydrochlorothiazide  (ZIAC ) 5-6.25 MG tablet Take 1 tablet by mouth once daily 90 tablet 0   Blood Glucose Monitoring Suppl DEVI 1 each by Does not apply route daily. May substitute to any manufacturer covered by patient's insurance. 1 each 0   Cholecalciferol (VITAMIN D3) 50 MCG (2000 UT) CHEW Chew 4,000 Units by mouth daily.      Clindamycin   Phos, Once-Daily, (CLINDAGEL) 1 % GEL Apply 1 Application topically 2 (two) times daily as needed (hidradenitis areas under the arm and in the groin). 75 mL 1   doxycycline  (VIBRA -TABS) 100 MG tablet Take 100 mg by mouth 2 (two) times daily.     fenofibrate  (TRICOR ) 48 MG tablet Take 1 tablet by mouth once daily 90 tablet 0   ferrous sulfate 325 (65 FE) MG tablet Take 325 mg by mouth daily with breakfast.     gabapentin  (NEURONTIN ) 100 MG capsule  TAKE 1 CAPSULE BY MOUTH AT BEDTIME AS NEEDED FOR  ARM  PAIN 90 capsule 0   glucose blood test strip Use as instructed to test once daily 100 each 12   indomethacin (INDOCIN) 25 MG capsule Take one capsule 3 times daily for 1 day , then one twice daily for 1 days , then one daily for 1  day , then stop 6 capsule 0   Multiple Vitamin (MULTIVITAMIN) tablet Take 1 tablet by mouth daily.     OneTouch Delica Lancets 33G MISC Use to test once daily. 100 each 12   tirzepatide  (MOUNJARO ) 12.5 MG/0.5ML Pen Inject 12.5 mg into the skin once a week. 6 mL 0   UNABLE TO FIND Med Name: Diabetic Shoes  Dx: E11.65 1 each 0   valsartan  (DIOVAN ) 80 MG tablet Take 1 tablet by mouth once daily 90 tablet 0   No facility-administered medications prior to visit.    Allergies  Allergen Reactions   Ace Inhibitors Other (See Comments)    Cough    Review of Systems  Constitutional:  Negative for chills and fever.  HENT:  Negative for congestion, sinus pressure, sinus pain and sore throat.   Eyes:  Negative for pain and discharge.  Respiratory:  Negative for cough and shortness of breath.   Cardiovascular:  Negative for chest pain and palpitations.  Gastrointestinal:  Negative for abdominal pain, diarrhea, nausea and vomiting.  Endocrine: Negative for polydipsia and polyuria.  Genitourinary:  Negative for dysuria and hematuria.  Musculoskeletal:  Negative for neck pain and neck stiffness.  Skin:  Negative for rash.       Left middle finger swelling  Neurological:   Positive for weakness and numbness. Negative for dizziness.  Psychiatric/Behavioral:  Negative for agitation and behavioral problems.        Objective:    Physical Exam Constitutional:      General: She is not in acute distress.    Appearance: She is obese. She is not diaphoretic.  HENT:     Head: Normocephalic and atraumatic.     Nose: Nose normal. No rhinorrhea.     Mouth/Throat:     Mouth: Mucous membranes are moist.     Pharynx: No posterior oropharyngeal erythema.  Eyes:     General: No scleral icterus.    Extraocular Movements: Extraocular movements intact.  Cardiovascular:     Rate and Rhythm: Normal rate and regular rhythm.     Heart sounds: Normal heart sounds. No murmur heard. Pulmonary:     Breath sounds: Normal breath sounds. No wheezing or rales.  Skin:    Findings: No rash.     Comments: Swelling and mild tenderness near the left middle finger nail  Neurological:     General: No focal deficit present.     Mental Status: She is alert and oriented to person, place, and time.  Psychiatric:        Mood and Affect: Mood normal.        Behavior: Behavior normal.     BP 132/72   Pulse 81   Ht 5' 9 (1.753 m)   Wt 255 lb 9.6 oz (115.9 kg)   SpO2 100%   BMI 37.75 kg/m  Wt Readings from Last 3 Encounters:  01/01/24 255 lb 9.6 oz (115.9 kg)  11/26/23 250 lb (113.4 kg)  10/14/23 249 lb 1.3 oz (113 kg)        Assessment & Plan:   Problem List Items Addressed This Visit  Endocrine   Type 2 diabetes mellitus with diabetic neuropathy, without long-term current use of insulin (HCC)   Continue gabapentin  100 mg nightly for neuropathy Referred to podiatry for diabetic shoes evaluation      Relevant Orders   Ambulatory referral to Podiatry     Musculoskeletal and Integument   Paronychia of finger of left hand - Primary   Swelling and tenderness likely due to paronychia, rather than gout She is apparently taking doxycycline  for hidradenitis  currently, advised to continue doxycycline  for now Advised to soak finger in warm water  If persistent or worse pain, may need I&D        No orders of the defined types were placed in this encounter.    Theresa MARLA Blanch, MD

## 2024-01-01 NOTE — Assessment & Plan Note (Signed)
 Swelling and tenderness likely due to paronychia, rather than gout She is apparently taking doxycycline  for hidradenitis currently, advised to continue doxycycline  for now Advised to soak finger in warm water  If persistent or worse pain, may need I&D

## 2024-01-05 ENCOUNTER — Ambulatory Visit: Payer: Self-pay

## 2024-01-05 ENCOUNTER — Telehealth: Payer: Self-pay | Admitting: Family Medicine

## 2024-01-05 DIAGNOSIS — L089 Local infection of the skin and subcutaneous tissue, unspecified: Secondary | ICD-10-CM

## 2024-01-05 NOTE — Telephone Encounter (Signed)
 Please advise Copied from CRM #[id number]. Topic: Appointments - Scheduling Inquiry for Clinic >> Jan 05, 2024  8:47 AM Jeoffrey H wrote: Reason for CRM: Patient called upset stating that she did not want to see Dr. Tobie anymore due to him not doing anything to help with her swollen finger. She demanded to see Dr. Antonetta however provider is booked up, she spoke with NT who was attempting to refer her to other providers that could assist in a more timely manner. Patient wants Dr. Antonetta to contact her ASAP.   Theresa Adkins- [phone] >> Jan 05, 2024  8:51 AM Jeoffrey H wrote: [phone]

## 2024-01-05 NOTE — Telephone Encounter (Signed)
 FYI Only or Action Required?: Action required by provider: request for appointment.  Patient was last seen in primary care on 01/01/2024 by Tobie Suzzane POUR, MD.  Called Nurse Triage reporting Finger Injury finger is swollen and painful.  Symptoms began a week ago.  Interventions attempted: Other: seen in office - soaking finger. IS taking antibiotics for other issue.  Symptoms are: unchanged.  Triage Disposition: See PCP When Office is Open (Within 3 Days)  Patient/caregiver understands and will follow disposition?: no - pt wants to be seen today by Dr. Antonetta. Called CAL attempted to warm transfer - pt was not on the line. Brittany, CAL, will call pt back.                          Copied from CRM #[id number]. Topic: Clinical - Red Word Triage >> Jan 05, 2024  7:58 AM Montie POUR wrote: Red Word that prompted transfer to Nurse Triage:  Middle finger on left hand is swollen, warm to touch and getting worse. She came in 01/01/24 and Dr. Tobie told her to soak her finger in warm water . Reason for Disposition  [1] MODERATE pain (e.g., interferes with normal activities) AND [2] present > 3 days  Answer Assessment - Initial Assessment Questions 1. ONSET: When did the pain start?      Over 1 week 2. LOCATION and RADIATION: Where is the pain located?  (e.g., fingertip, around nail, joint, entire      Left middle finger 3. SEVERITY: How bad is the pain? What does it keep you from doing?   (Scale 1-10; or mild, moderate, severe)     unsure 4. APPEARANCE: What does the finger look like? (e.g., redness, swelling, bruising, pallor)     swollen  Protocols used: Finger Pain-A-AH

## 2024-01-06 MED ORDER — AMOXICILLIN-POT CLAVULANATE 875-125 MG PO TABS: 1.0000 | ORAL_TABLET | Freq: Two times a day (BID) | ORAL | 0 refills | Status: DC

## 2024-01-06 NOTE — Telephone Encounter (Signed)
 I spoke with pt on 01/05/2024. She reported increased swelling and redness of affected finger and states that she feels as though there is pus in the nail bed that if she could get it out she would feel much better I advised stop doxycycline  since not responding and worsening, and prescribed augmentin  as well as referring her to Dermatology asap

## 2024-01-06 NOTE — Addendum Note (Signed)
 Addended by: ANTONETTA ROLLENE BRAVO on: 01/06/2024 10:43 AM   Modules accepted: Orders

## 2024-01-07 ENCOUNTER — Encounter: Payer: Self-pay | Admitting: Internal Medicine

## 2024-01-07 ENCOUNTER — Ambulatory Visit: Attending: Internal Medicine | Admitting: Internal Medicine

## 2024-01-07 VITALS — BP 138/72 | HR 84 | Ht 69.0 in | Wt 259.0 lb

## 2024-01-07 DIAGNOSIS — R9431 Abnormal electrocardiogram [ECG] [EKG]: Secondary | ICD-10-CM | POA: Diagnosis not present

## 2024-01-07 DIAGNOSIS — L0100 Impetigo, unspecified: Secondary | ICD-10-CM | POA: Diagnosis not present

## 2024-01-07 DIAGNOSIS — R931 Abnormal findings on diagnostic imaging of heart and coronary circulation: Secondary | ICD-10-CM | POA: Insufficient documentation

## 2024-01-07 DIAGNOSIS — I10 Essential (primary) hypertension: Secondary | ICD-10-CM | POA: Insufficient documentation

## 2024-01-07 DIAGNOSIS — I272 Pulmonary hypertension, unspecified: Secondary | ICD-10-CM | POA: Diagnosis not present

## 2024-01-07 MED ORDER — ATORVASTATIN CALCIUM 20 MG PO TABS: 20.0000 mg | ORAL_TABLET | Freq: Every day | ORAL | 3 refills | Status: AC

## 2024-01-07 NOTE — Patient Instructions (Signed)
 Medication Instructions:  Your physician has recommended you make the following change in your medication:   -Increase Atorvastatin  20 mg once daily   *If you need a refill on your cardiac medications before your next appointment, please call your pharmacy*  Lab Work: None If you have labs (blood work) drawn today and your tests are completely normal, you will receive your results only by: MyChart Message (if you have MyChart) OR A paper copy in the mail If you have any lab test that is abnormal or we need to change your treatment, we will call you to review the results.  Testing/Procedures: None  Follow-Up: At [facility], you and your health needs are our priority.  As part of our continuing mission to provide you with exceptional heart care, our providers are all part of one team.  This team includes your primary Cardiologist (physician) and Advanced Practice Providers or APPs (Physician Assistants and Nurse Practitioners) who all work together to provide you with the care you need, when you need it.  Your next appointment:    As Needed.  Provider:   You may see Vishnu P Mallipeddi, MD or one of the following Advanced Practice Providers on your designated Care Team:   Brittany Strader, PA-C  Scotesia Clyde, [place] Olivia Pavy, [place]     We recommend signing up for the patient portal called MyChart.  Sign up information is provided on this After Visit Summary.  MyChart is used to connect with patients for Virtual Visits (Telemedicine).  Patients are able to view lab/test results, encounter notes, upcoming appointments, etc.  Non-urgent messages can be sent to your provider as well.   To learn more about what you can do with MyChart, go to [email or web address].   Other Instructions

## 2024-01-07 NOTE — Progress Notes (Signed)
 Cardiology Office Note  Date: 01/07/2024   ID: Brendi, Mccarroll [birth year], MRN [id number]  PCP:  Antonetta Rollene BRAVO, MD  Cardiologist:  Diannah SHAUNNA Maywood, MD Electrophysiologist:  None   History of Present Illness: Theresa Adkins is a 78 y.o. female  Referred to cardiology clinic for evaluation of abnormal EKG.  EKG showed NSR, RBBB.  Provide EKG showed NSR, nonspecific T wave changes.  CT cardiac coronary calcium  scoring in October 2025 showed coronary calcium  score of 325, 83rd percentile for age and sex matched control.  Past Medical History:  Diagnosis Date   Allergic rhinitis, seasonal    Arthritis    Breast cancer [facility] dx 02/ 2015---  oncologist-  dr shadad/ dr dewey   dx Right breast upper-outer quadrant DCIS, high grade (Tis N0), ER+, PR negative--- 07-01-2013  s/p  right breast lumpectomy w/ sln bxs and radiation therapy (08-24-2013 to 09-20-2013)   Diabetes mellitus type II    Diabetic neuropathy (HCC)    Heart murmur    Hidradenitis axillaris    chronic   History of adenomatous polyp of colon    03-04-2008  tubular adenoma   History of radiation therapy 08-24-2013 to 09-20-2013   total 50Gy   Hyperlipidemia    Hypertension    Personal history of radiation therapy    PMB (postmenopausal bleeding) 05/14/2016   Thickened endometrium    Wears dentures    upper   Wears glasses     Past Surgical History:  Procedure Laterality Date   BREAST BIOPSY Right 04/16/2016   BREAST LUMPECTOMY Right 08/2013   BREAST LUMPECTOMY WITH NEEDLE LOCALIZATION AND AXILLARY SENTINEL LYMPH NODE BX Right 07/01/2013   Procedure: BREAST LUMPECTOMY WITH NEEDLE LOCALIZATION AND AXILLARY SENTINEL LYMPH NODE BX;  Surgeon: Deward GORMAN Curvin DOUGLAS, MD;  Location: [facility];  Service: General;  Laterality: Right;   BREAST SURGERY  1970s   benign tumor removed from unspecified breast    COLONOSCOPY N/A 11/27/2018   Procedure: COLONOSCOPY;  Surgeon: Harvey Margo CROME, MD;  [facility];   Service: Endoscopy;  Laterality: N/A;  8:30   COLONOSCOPY W/ POLYPECTOMY  03/04/2008   DILATATION & CURRETTAGE/HYSTEROSCOPY WITH RESECTOCOPE N/A 05/14/2016   Procedure: DILATATION & CURETTAGE/HYSTEROSCOPY WITH RESECTOCOPE;  Surgeon: Shanda SHAUNNA Muscat, MD;  Location: [facility];  Service: Gynecology;  Laterality: N/A;   POLYPECTOMY  11/27/2018   Procedure: POLYPECTOMY;  Surgeon: Harvey Margo CROME, MD;  [facility]: [facility];  Service: Endoscopy;;  colon    Current Outpatient Medications  Medication Sig Dispense Refill   amLODipine  (NORVASC ) 10 MG tablet Take 1 tablet by mouth once daily 90 tablet 0   amoxicillin -clavulanate (AUGMENTIN ) 875-125 MG tablet Take 1 tablet by mouth 2 (two) times daily. 20 tablet 0   aspirin EC 81 MG tablet Take 81 mg by mouth daily. Swallow whole.     atorvastatin  (LIPITOR) 20 MG tablet Take 1 tablet (20 mg total) by mouth daily. 90 tablet 3   bisoprolol -hydrochlorothiazide  (ZIAC ) 5-6.25 MG tablet Take 1 tablet by mouth once daily 90 tablet 0   Blood Glucose Monitoring Suppl DEVI 1 each by Does not apply route daily. May substitute to any manufacturer covered by patient's insurance. 1 each 0   Cholecalciferol (VITAMIN D3) 50 MCG (2000 UT) CHEW Chew 4,000 Units by mouth daily.      fenofibrate  (TRICOR ) 48 MG tablet Take 1 tablet by mouth once daily 90 tablet 0   ferrous sulfate 325 (65  FE) MG tablet Take 325 mg by mouth daily with breakfast.     gabapentin  (NEURONTIN ) 100 MG capsule TAKE 1 CAPSULE BY MOUTH AT BEDTIME AS NEEDED FOR  ARM  PAIN 90 capsule 0   glucose blood test strip Use as instructed to test once daily 100 each 12   Multiple Vitamin (MULTIVITAMIN) tablet Take 1 tablet by mouth daily.     mupirocin ointment (BACTROBAN) 2 % Apply 1 Application topically 3 (three) times daily as needed.     OneTouch Delica Lancets 33G MISC Use to test once daily. 100 each 12   tirzepatide  (MOUNJARO ) 12.5 MG/0.5ML Pen Inject 12.5 mg into the skin once a week.  6 mL 0   UNABLE TO FIND Med Name: Diabetic Shoes  Dx: E11.65 1 each 0   valsartan  (DIOVAN ) 80 MG tablet Take 1 tablet by mouth once daily 90 tablet 0   doxycycline  (VIBRA -TABS) 100 MG tablet Take 100 mg by mouth 2 (two) times daily. (Patient not taking: Reported on 01/07/2024)     No current facility-administered medications for this visit.   Allergies:  Ace inhibitors   Social History: The patient  reports that she quit smoking about 21 years ago. Her smoking use included cigarettes. She started smoking about 46 years ago. She has a 25 pack-year smoking history. She has never used smokeless tobacco. She reports that she does not drink alcohol and does not use drugs.   Family History: The patient's family history includes Bone cancer (age of onset: 21) in her brother; Heart disease in her mother; Leukemia (age of onset: 26) in her sister; Obesity in her sister and sister.   ROS:  Please see the history of present illness. Otherwise, complete review of systems is positive for none.  All other systems are reviewed and negative.   Physical Exam: VS:  BP 138/72   Pulse 84   Ht 5' 9 (1.753 m)   Wt 259 lb (117.5 kg)   SpO2 99%   BMI 38.25 kg/m , BMI Body mass index is 38.25 kg/m.  Wt Readings from Last 3 Encounters:  01/07/24 259 lb (117.5 kg)  01/01/24 255 lb 9.6 oz (115.9 kg)  11/26/23 250 lb (113.4 kg)    General: Patient appears comfortable at rest. HEENT: Conjunctiva and lids normal, oropharynx clear with moist mucosa. Neck: Supple, no elevated JVP or carotid bruits, no thyromegaly. Lungs: Clear to auscultation, nonlabored breathing at rest. Cardiac: Regular rate and rhythm, no S3 or significant systolic murmur, no pericardial rub. Abdomen: Soft, nontender, no hepatomegaly, bowel sounds present, no guarding or rebound. Extremities: No pitting edema, distal pulses 2+. Skin: Warm and dry. Musculoskeletal: No kyphosis. Neuropsychiatric: Alert and oriented x3, affect grossly  appropriate.  Recent Labwork: 06/19/2023: Hemoglobin 11.1; Platelets 390 10/09/2023: ALT 12; AST 13; BUN 10; Creatinine, Ser 0.78; Potassium 4.3; Sodium 144     Component Value Date/Time   CHOL 175 06/19/2023 0856   CHOL 172 12/23/2014 0000   TRIG 101 06/19/2023 0856   HDL 56 06/19/2023 0856   CHOLHDL 3.1 06/19/2023 0856   CHOLHDL 3.2 11/02/2019 0710   VLDL 42 (H) 07/04/2016 0820   LDLCALC 101 (H) 06/19/2023 0856   LDLCALC 89 11/02/2019 0710     Assessment and Plan:  Abnormal EKG: I reviewed the EKG that showed NSR, RBBB.  Prior EKG showed NSR, nonspecific T wave changes.  Benign, provided reassurance.  Elevated coronary calcium  score HLD, not at goal - Coronary calcium  score is 325, 83rd percentile  for age and sex matched control. - Does not have any symptoms of angina or DOE. - Her sister recently had PCI. - I reviewed and discussed symptoms of CAD and MI with the patient today. - If she experiences any angina or DOE, would recommend CT cardiac provided no CKD.  Concern for balanced ischemia from diabetes mellitus.  Risk factors include HTN, DM 2. - ER precautions for chest pain provided. - Continue aspirin 81 mg once daily. - Increase atorvastatin  from 10 mg to 20 mg nightly.  Goal LDL less than 70.  Pulmonary hypertension - There is imaging evidence of pulmonary hypertension.  No symptoms.  Will obtain echocardiogram.  HTN, controlled - Continue bisoprolol -HCTZ 5-6.25 mg once daily.  40 minutes spent in reviewing prior medical records, more than 3 labs, discussion and documentation.  Medication Adjustments/Labs and Tests Ordered: Current medicines are reviewed at length with the patient today.  Concerns regarding medicines are outlined above.    Disposition:  Follow up as needed  Signed, Willye Javier Arleta Maywood, MD, 01/07/2024 4:17 PM    [facility] [address] [place], [place] [address]

## 2024-01-13 DIAGNOSIS — L2989 Other pruritus: Secondary | ICD-10-CM | POA: Diagnosis not present

## 2024-01-13 DIAGNOSIS — L03012 Cellulitis of left finger: Secondary | ICD-10-CM | POA: Diagnosis not present

## 2024-01-17 ENCOUNTER — Other Ambulatory Visit: Payer: Self-pay | Admitting: Family Medicine

## 2024-01-28 ENCOUNTER — Ambulatory Visit: Admitting: Podiatry

## 2024-01-28 DIAGNOSIS — B351 Tinea unguium: Secondary | ICD-10-CM

## 2024-01-28 DIAGNOSIS — Q6672 Congenital pes cavus, left foot: Secondary | ICD-10-CM

## 2024-01-28 DIAGNOSIS — M79675 Pain in left toe(s): Secondary | ICD-10-CM | POA: Diagnosis not present

## 2024-01-28 DIAGNOSIS — M79674 Pain in right toe(s): Secondary | ICD-10-CM | POA: Diagnosis not present

## 2024-01-28 DIAGNOSIS — Q6671 Congenital pes cavus, right foot: Secondary | ICD-10-CM | POA: Diagnosis not present

## 2024-01-28 DIAGNOSIS — Q667 Congenital pes cavus, unspecified foot: Secondary | ICD-10-CM

## 2024-01-28 NOTE — Progress Notes (Unsigned)
 Subjective: Theresa Adkins presents today referred by Antonetta Rollene BRAVO, MD for diabetic foot evaluation.  Patient relates many year history of diabetes.  Patient denies any history of foot wounds.  Patient denies any history of numbness, tingling, burning, pins/needles sensations.  Past Medical History:  Diagnosis Date   Allergic rhinitis, seasonal    Arthritis    Breast cancer [facility]) dx 02/ 2015---  oncologist-  dr shadad/ dr dewey   dx Right breast upper-outer quadrant DCIS, high grade (Tis N0), ER+, PR negative--- 07-01-2013  s/p  right breast lumpectomy w/ sln bxs and radiation therapy (08-24-2013 to 09-20-2013)   Diabetes mellitus type II    Diabetic neuropathy (HCC)    Heart murmur    Hidradenitis axillaris    chronic   History of adenomatous polyp of colon    03-04-2008  tubular adenoma   History of radiation therapy 08-24-2013 to 09-20-2013   total 50Gy   Hyperlipidemia    Hypertension    Personal history of radiation therapy    PMB (postmenopausal bleeding) 05/14/2016   Thickened endometrium    Wears dentures    upper   Wears glasses     Patient Active Problem List   Diagnosis Date Noted   Elevated coronary artery calcium  score 01/07/2024   Pulmonary hypertension, unspecified (HCC) 01/07/2024   Paronychia of finger of left hand 01/01/2024   Type 2 diabetes mellitus with diabetic neuropathy, without long-term current use of insulin (HCC) 01/01/2024   Encounter for Medicare annual examination with abnormal findings 10/14/2023   At increased risk for cardiovascular disease 10/14/2023   Anogenital warts in female 06/26/2023   Encounter for immunization 09/13/2022   Skin lesion 07/01/2022   Hyperuricemia 07/01/2022   Infected cyst of skin 02/05/2021   Rash and nonspecific skin eruption 04/23/2020   Gout 03/29/2020   Localized swelling of toe of left foot 03/26/2020   Abnormal brain scan 11/23/2019   Neck pain 10/02/2019   Chronic midline low back pain with  left-sided sciatica 10/02/2019   Right arm weakness 10/02/2019   History of colonic polyps    Tubular adenoma of colon 08/01/2017   Abnormal ultrasound of endometrium 05/14/2016   Polyp of corpus uteri 01/24/2016   Uterine leiomyoma 01/19/2016   Breast cancer in situ 02/18/2013   Heme positive stool 11/13/2012   Nonspecific abnormal electrocardiogram (ECG) (EKG) 06/24/2011   Vitamin D  deficiency 09/24/2009   Type 2 diabetes mellitus with other specified complication (HCC) 08/10/2007   Hyperlipidemia 08/10/2007   Morbid obesity (HCC) 08/10/2007   HTN (hypertension) 08/10/2007   ALLERGIC RHINITIS, SEASONAL 08/10/2007   Hidradenitis 08/10/2007    Past Surgical History:  Procedure Laterality Date   BREAST BIOPSY Right 04/16/2016   BREAST LUMPECTOMY Right 08/2013   BREAST LUMPECTOMY WITH NEEDLE LOCALIZATION AND AXILLARY SENTINEL LYMPH NODE BX Right 07/01/2013   Procedure: BREAST LUMPECTOMY WITH NEEDLE LOCALIZATION AND AXILLARY SENTINEL LYMPH NODE BX;  Surgeon: Deward GORMAN Curvin DOUGLAS, MD;  Location: [facility];  Service: General;  Laterality: Right;   BREAST SURGERY  1970s   benign tumor removed from unspecified breast    COLONOSCOPY N/A 11/27/2018   Procedure: COLONOSCOPY;  Surgeon: Harvey Margo CROME, MD;  [facility];  Service: Endoscopy;  Laterality: N/A;  8:30   COLONOSCOPY W/ POLYPECTOMY  03/04/2008   DILATATION & CURRETTAGE/HYSTEROSCOPY WITH RESECTOCOPE N/A 05/14/2016   Procedure: DILATATION & CURETTAGE/HYSTEROSCOPY WITH RESECTOCOPE;  Surgeon: Shanda SHAUNNA Muscat, MD;  Location: [facility] [place];  Service: Gynecology;  Laterality: N/A;   POLYPECTOMY  11/27/2018   Procedure: POLYPECTOMY;  Surgeon: Harvey Margo CROME, MD;  [facility];  Service: Endoscopy;;  colon    Medications Ordered Prior to Encounter[1]   Allergies[2]  Social History   Occupational History   Occupation: Employed   Tobacco Use   Smoking status: Former    Current packs/day: 0.00    Average  packs/day: 1 pack/day for 25.0 years (25.0 ttl pk-yrs)    Types: Cigarettes    Start date: 02/17/1977    Quit date: 02/17/2002    Years since quitting: 21.9   Smokeless tobacco: Never  Substance and Sexual Activity   Alcohol use: No   Drug use: No   Sexual activity: Never    Birth control/protection: None    Family History  Problem Relation Age of Onset   Heart disease Mother    Obesity Sister    Leukemia Sister 9   Obesity Sister    Bone cancer Brother 73   Breast cancer Neg Hx    BRCA 1/2 Neg Hx     Immunization History  Administered Date(s) Administered   Fluad Quad(high Dose 65+) 11/08/2019, 12/11/2020, 11/30/2021   Fluad Trivalent(High Dose 65+) 12/17/2022   INFLUENZA, HIGH DOSE SEASONAL PF 12/12/2023   Influenza Split 01/13/2012, 11/10/2018   Influenza,inj,Quad PF,6+ Mos 11/13/2012, 11/25/2013, 11/01/2014, 12/21/2015, 12/03/2016, 12/05/2017   Influenza-Unspecified 12/21/2015   PFIZER(Purple Top)SARS-COV-2 Vaccination 04/11/2019, 05/05/2019, 01/24/2020   Pneumococcal Conjugate-13 12/29/2014   Pneumococcal Polysaccharide-23 01/19/2016   Pneumococcal-Unspecified 01/19/2016   Tdap 06/05/2010, 09/10/2020   Zoster Recombinant(Shingrix) 06/22/2021, 09/28/2021   Zoster, Live 06/05/2010   Zoster, Unspecified 06/05/2010    Review of systems: Positive Findings in bold print.  Constitutional:  chills, fatigue, fever, sweats, weight change Communication: nurse, learning disability, sign presenter, broadcasting, hand writing, iPad/Android device Head: headaches, head injury Eyes: changes in vision, eye pain, glaucoma, cataracts, macular degeneration, diplopia, glare,  light sensitivity, eyeglasses or contacts, blindness Ears nose mouth throat: hearing impaired, hearing aids,  ringing in ears, deaf, sign language,  vertigo, nosebleeds,  rhinitis,  cold sores, snoring, swollen glands Cardiovascular: HTN, edema, arrhythmia, pacemaker in place, defibrillator in place, chest pain/tightness, chronic  anticoagulation, blood clot, heart failure, MI Peripheral Vascular: leg cramps, varicose veins, blood clots, lymphedema, varicosities Respiratory:  asthma, difficulty breathing, denies congestion, SOB, wheezing, cough, emphysema Gastrointestinal: change in appetite or weight, abdominal pain, constipation, diarrhea, nausea, vomiting, vomiting blood, change in bowel habits, abdominal pain, jaundice, rectal bleeding, hemorrhoids, GERD Genitourinary:  nocturia,  pain on urination, polyuria,  blood in urine, Foley catheter, urinary urgency, ESRD on hemodialysis Musculoskeletal: amputation, cramping, stiff joints, painful joints, decreased joint motion, fractures, OA, gout, hemiplegia, paraplegia, uses cane, wheelchair bound, uses walker, uses rollator Skin: +changes in toenails, color change, dryness, itching, mole changes,  rash, wound(s) Neurological: headaches, numbness in feet, paresthesias in feet, burning in feet, fainting,  seizures, change in speech, migraines, memory problems/poor historian, cerebral palsy, weakness, paralysis, CVA, TIA Endocrine: diabetes, hypothyroidism, hyperthyroidism,  goiter, dry mouth, flushing, heat intolerance, cold intolerance,  excessive thirst, denies polyuria,  nocturia Hematological:  easy bleeding, excessive bleeding, easy bruising, enlarged lymph nodes, on long term blood thinner, history of past transusions Allergy/immunological:  hives, eczema, frequent infections, multiple drug allergies, seasonal allergies, transplant recipient, multiple food allergies Psychiatric:  anxiety, depression, mood disorder, suicidal ideations, hallucinations, insomnia  Objective: There were no vitals filed for this visit. Vascular Examination: Capillary refill time less than 3 seconds x 10 digits.  Dorsalis pedis pulses  palpable 2 out of 4.  Posterior tibial pulses palpable 2 out of 4.  Digital hair not present x 10 digits.  Skin temperature gradient WNL  b/l.  Dermatological Examination: Skin with normal turgor, texture and tone b/l  Toenails 1-5 b/l discolored, thick, dystrophic with subungual debris and pain with palpation to nailbeds due to thickness of nails.  Musculoskeletal: Muscle strength 5/5 to all LE muscle groups.  Neurological: Sensation diminished with 10 gram monofilament.  Vibratory sensation diminished  Assessment: NIDDM Encounter for diabetic foot examination  Plan: Discussed diabetic foot care principles. Literature dispensed on today. Patient to continue soft, supportive shoe gear daily. Patient to report any pedal injuries to medical professional immediately. Follow up one year. Patient/POA to call should there be a concern in the interim.  Pes cavus/foot deformity -I explained to patient the etiology of pes pcavus and relationship with heel pain/arch pain and various treatment options were discussed.  Given patient foot structure in the setting of heel pain/arch pain I believe patient will benefit from custom-made orthotics to help control the hindfoot motion support the arch of the foot and take the stress away from arches.  Patient agrees with the plan like to proceed with orthotics -Patient was casted for orthotics      [1]  Current Outpatient Medications on File Prior to Visit  Medication Sig Dispense Refill   amLODipine  (NORVASC ) 10 MG tablet Take 1 tablet by mouth once daily 90 tablet 0   amoxicillin -clavulanate (AUGMENTIN ) 875-125 MG tablet Take 1 tablet by mouth 2 (two) times daily. 20 tablet 0   aspirin EC 81 MG tablet Take 81 mg by mouth daily. Swallow whole.     atorvastatin  (LIPITOR) 20 MG tablet Take 1 tablet (20 mg total) by mouth daily. 90 tablet 3   bisoprolol -hydrochlorothiazide  (ZIAC ) 5-6.25 MG tablet Take 1 tablet by mouth once daily 90 tablet 0   Blood Glucose Monitoring Suppl DEVI 1 each by Does not apply route daily. May substitute to any manufacturer covered by patient's insurance.  1 each 0   Cholecalciferol (VITAMIN D3) 50 MCG (2000 UT) CHEW Chew 4,000 Units by mouth daily.      doxycycline  (VIBRA -TABS) 100 MG tablet Take 100 mg by mouth 2 (two) times daily. (Patient not taking: Reported on 01/07/2024)     fenofibrate  (TRICOR ) 48 MG tablet Take 1 tablet by mouth once daily 90 tablet 0   ferrous sulfate 325 (65 FE) MG tablet Take 325 mg by mouth daily with breakfast.     gabapentin  (NEURONTIN ) 100 MG capsule TAKE 1 CAPSULE BY MOUTH AT BEDTIME AS NEEDED FOR  ARM  PAIN 90 capsule 0   glucose blood test strip Use as instructed to test once daily 100 each 12   Multiple Vitamin (MULTIVITAMIN) tablet Take 1 tablet by mouth daily.     mupirocin ointment (BACTROBAN) 2 % Apply 1 Application topically 3 (three) times daily as needed.     OneTouch Delica Lancets 33G MISC Use to test once daily. 100 each 12   tirzepatide  (MOUNJARO ) 12.5 MG/0.5ML Pen Inject 12.5 mg into the skin once a week. 6 mL 0   UNABLE TO FIND Med Name: Diabetic Shoes  Dx: E11.65 1 each 0   valsartan  (DIOVAN ) 80 MG tablet Take 1 tablet by mouth once daily 90 tablet 0   No current facility-administered medications on file prior to visit.  [2]  Allergies Allergen Reactions   Ace Inhibitors Other (See Comments)    Cough

## 2024-01-29 ENCOUNTER — Ambulatory Visit (HOSPITAL_COMMUNITY): Admission: RE | Admit: 2024-01-29 | Discharge: 2024-01-29 | Attending: Internal Medicine

## 2024-01-29 DIAGNOSIS — I272 Pulmonary hypertension, unspecified: Secondary | ICD-10-CM | POA: Insufficient documentation

## 2024-01-29 LAB — ECHOCARDIOGRAM COMPLETE
AR max vel: 2.49 cm2
AV Area VTI: 2.31 cm2
AV Area mean vel: 2.42 cm2
AV Mean grad: 4 mmHg
AV Peak grad: 8.3 mmHg
Ao pk vel: 1.44 m/s
Area-P 1/2: 3.23 cm2
Calc EF: 66.4 %
S' Lateral: 2.5 cm
Single Plane A2C EF: 64.8 %
Single Plane A4C EF: 66.9 %

## 2024-02-17 ENCOUNTER — Ambulatory Visit: Payer: Self-pay | Admitting: Internal Medicine

## 2024-02-24 ENCOUNTER — Telehealth: Payer: Self-pay

## 2024-02-24 NOTE — Telephone Encounter (Signed)
 Orthotics are in [place]. Spoke to [place] and she is scheduled to [facility] 02/27/2024

## 2024-02-27 ENCOUNTER — Ambulatory Visit

## 2024-02-27 DIAGNOSIS — Q667 Congenital pes cavus, unspecified foot: Secondary | ICD-10-CM

## 2024-02-27 NOTE — Progress Notes (Unsigned)

## 2024-03-06 ENCOUNTER — Other Ambulatory Visit: Payer: Self-pay | Admitting: Family Medicine

## 2024-03-06 LAB — CBC WITH DIFFERENTIAL/PLATELET
Basophils Absolute: 0 x10E3/uL (ref 0.0–0.2)
Basos: 1 %
EOS (ABSOLUTE): 0.2 x10E3/uL (ref 0.0–0.4)
Eos: 3 %
Hematocrit: 38.5 % (ref 34.0–46.6)
Hemoglobin: 12.4 g/dL (ref 11.1–15.9)
Immature Grans (Abs): 0 x10E3/uL (ref 0.0–0.1)
Immature Granulocytes: 0 %
Lymphocytes Absolute: 1.9 x10E3/uL (ref 0.7–3.1)
Lymphs: 32 %
MCH: 27 pg (ref 26.6–33.0)
MCHC: 32.2 g/dL (ref 31.5–35.7)
MCV: 84 fL (ref 79–97)
Monocytes Absolute: 0.5 x10E3/uL (ref 0.1–0.9)
Monocytes: 9 %
Neutrophils Absolute: 3.3 x10E3/uL (ref 1.4–7.0)
Neutrophils: 55 %
Platelets: 330 x10E3/uL (ref 150–450)
RBC: 4.6 x10E6/uL (ref 3.77–5.28)
RDW: 14.3 % (ref 11.7–15.4)
WBC: 6 x10E3/uL (ref 3.4–10.8)

## 2024-03-06 LAB — CMP14+EGFR
ALT: 9 IU/L (ref 0–32)
AST: 15 IU/L (ref 0–40)
Albumin: 4.1 g/dL (ref 3.8–4.8)
Alkaline Phosphatase: 51 IU/L (ref 49–135)
BUN/Creatinine Ratio: 17 (ref 12–28)
BUN: 15 mg/dL (ref 8–27)
Bilirubin Total: 0.4 mg/dL (ref 0.0–1.2)
CO2: 22 mmol/L (ref 20–29)
Calcium: 9.4 mg/dL (ref 8.7–10.3)
Chloride: 105 mmol/L (ref 96–106)
Creatinine, Ser: 0.86 mg/dL (ref 0.57–1.00)
Globulin, Total: 2.8 g/dL (ref 1.5–4.5)
Glucose: 108 mg/dL — ABNORMAL HIGH (ref 70–99)
Potassium: 3.7 mmol/L (ref 3.5–5.2)
Sodium: 143 mmol/L (ref 134–144)
Total Protein: 6.9 g/dL (ref 6.0–8.5)
eGFR: 69 mL/min/1.73

## 2024-03-06 LAB — LIPID PANEL
Chol/HDL Ratio: 3.3 ratio (ref 0.0–4.4)
Cholesterol, Total: 145 mg/dL (ref 100–199)
HDL: 44 mg/dL
LDL Chol Calc (NIH): 84 mg/dL (ref 0–99)
Triglycerides: 89 mg/dL (ref 0–149)
VLDL Cholesterol Cal: 17 mg/dL (ref 5–40)

## 2024-03-06 LAB — HEMOGLOBIN A1C
Est. average glucose Bld gHb Est-mCnc: 143 mg/dL
Hgb A1c MFr Bld: 6.6 % — ABNORMAL HIGH (ref 4.8–5.6)

## 2024-03-06 LAB — VITAMIN D 25 HYDROXY (VIT D DEFICIENCY, FRACTURES): Vit D, 25-Hydroxy: 113 ng/mL — ABNORMAL HIGH (ref 30.0–100.0)

## 2024-03-11 ENCOUNTER — Telehealth: Payer: Self-pay | Admitting: Family Medicine

## 2024-03-11 ENCOUNTER — Encounter: Payer: Self-pay | Admitting: Family Medicine

## 2024-03-11 ENCOUNTER — Ambulatory Visit: Admitting: Family Medicine

## 2024-03-11 VITALS — BP 125/65 | HR 70 | Resp 16 | Ht 69.0 in | Wt 251.0 lb

## 2024-03-11 DIAGNOSIS — E1140 Type 2 diabetes mellitus with diabetic neuropathy, unspecified: Secondary | ICD-10-CM | POA: Diagnosis not present

## 2024-03-11 DIAGNOSIS — I10 Essential (primary) hypertension: Secondary | ICD-10-CM

## 2024-03-11 DIAGNOSIS — Z7985 Long-term (current) use of injectable non-insulin antidiabetic drugs: Secondary | ICD-10-CM

## 2024-03-11 DIAGNOSIS — N764 Abscess of vulva: Secondary | ICD-10-CM

## 2024-03-11 DIAGNOSIS — L732 Hidradenitis suppurativa: Secondary | ICD-10-CM

## 2024-03-11 DIAGNOSIS — E785 Hyperlipidemia, unspecified: Secondary | ICD-10-CM

## 2024-03-11 MED ORDER — TIRZEPATIDE 15 MG/0.5ML ~~LOC~~ SOAJ: 15.0000 mg | SUBCUTANEOUS | 1 refills | Status: AC

## 2024-03-11 NOTE — Telephone Encounter (Signed)
 Per patient at checking out tried to call and schedule her mammogram, per patient she will call and do it herself she said.

## 2024-03-11 NOTE — Progress Notes (Signed)
 "  Theresa Adkins     MRN: [id number]      DOB: 03/25/1945  Chief Complaint  Patient presents with   Medical Management of Chronic Issues    5 month follow up     HPI Theresa Adkins is here for follow up and re-evaluation of chronic medical conditions, medication management and review of any available recent lab and radiology data.  Preventive health is updated, specifically  Cancer screening and Immunization.   Questions or concerns regarding consultations or procedures which the PT has had in the interim are  addressed. The PT denies any adverse reactions to current medications since the last visit.  C/o worsening of boils and cysts in perineum, needs gyne  follow up, states her medication is the cause, meds reviewed and this is not the case, skin condition has been present for over 20 years, more of an axillary issue tan vulvar in the past Denies polyuria, polydipsia, blurred vision , or hypoglycemic episodes.   ROS Denies recent fever or chills. Denies sinus pressure, nasal congestion, ear pain or sore throat. Denies chest congestion, productive cough or wheezing. Denies chest pains, palpitations and leg swelling Denies abdominal pain, nausea, vomiting,diarrhea or constipation.   Denies dysuria, frequency, hesitancy or incontinence. Chronic  joint pain,  and limitation in mobility. Denies headaches, seizures,c/o lower extrmity  numbness, or tingling. Denies depression, anxiety or insomnia.  PE  BP 125/65   Pulse 70   Resp 16   Ht 5' 9 (1.753 m)   Wt 251 lb (113.9 kg)   SpO2 90%   BMI 37.07 kg/m    Patient alert and oriented and in no cardiopulmonary distress.  HEENT: No facial asymmetry, EOMI,     Neck supple .  Chest: Clear to auscultation bilaterally.  CVS: S1, S2 no murmurs, no S3.Regular rate.  ABD: Soft non tender.   Ext: No edema  MS: decreased  ROM spine, shoulders, hips and knees.   Psych: Good eye contact, normal affect. Memory intact not anxious or  depressed appearing.  CNS: CN 2-12 intact, power,  normal throughout.no focal deficits noted.   Assessment & Plan  Primary hypertension       Type 2 diabetes mellitus with diabetic neuropathy, without long-term current use of insulin (HCC) Diabetes associated with hypertension, hyperlipidemia, obesity, and arthritis Increase dose of mounjaro   Theresa Adkins is reminded of the importance of commitment to daily physical activity for 30 minutes or more, as able and the need to limit carbohydrate intake to 30 to 60 grams per meal to help with blood sugar control.   The need to take medication as prescribed, test blood sugar as directed, and to call between visits if there is a concern that blood sugar is uncontrolled is also discussed.   Theresa Adkins is reminded of the importance of daily foot exam, annual eye examination, and good blood sugar, blood pressure and cholesterol control.     Latest Ref Rng & Units 03/05/2024    8:40 AM 10/09/2023    9:44 AM 06/26/2023    3:25 PM 06/19/2023    8:56 AM 03/13/2023   10:04 AM  Diabetic Labs  HbA1c 4.8 - 5.6 % 6.6  6.6   6.9  7.5   Micro/Creat Ratio 0 - 29 mg/g creat   14     Chol 100 - 199 mg/dL 854    824  837   HDL >60 mg/dL 44    56  46   Calc LDL  0 - 99 mg/dL 84    898  94   Triglycerides 0 - 149 mg/dL 89    898  874   Creatinine 0.57 - 1.00 mg/dL 9.13  9.21   9.23  9.15       03/11/2024    8:09 AM 01/07/2024    2:44 PM 01/01/2024    9:14 AM 11/26/2023    1:24 PM 10/14/2023    8:19 AM 09/17/2023    3:28 PM 09/03/2023   11:13 AM  BP/Weight  Systolic BP 125 138 132 -- 129 130 123  Diastolic BP 65 72 72 -- 69 74 72  Wt. (Lbs) 251 259 255.6 250 249.08 249.8 248  BMI 37.07 kg/m2 38.25 kg/m2 37.75 kg/m2 36.92 kg/m2 36.78 kg/m2 36.89 kg/m2 36.62 kg/m2      Latest Ref Rng & Units 10/14/2023    8:20 AM 04/17/2023   12:00 AM  Foot/eye exam completion dates  Eye Exam No Retinopathy  No Retinopathy      Foot Form Completion  Done      This result  is from an external source.        Hydradenitis Persistent problem in perineum, pt reports worsening refer Gyne to re eval and f/u  Vulvar boil Recurrent / persistent boil on vulva, gyne re eval/f/u  Morbid obesity (HCC)  Patient re-educated about  the importance of commitment to a  minimum of 150 minutes of exercise per week as able.  The importance of healthy food choices with portion control discussed, as well as eating regularly and within a 12 hour window most days. The need to choose clean , green food 50 to 75% of the time is discussed, as well as to make water  the primary drink and set a goal of 64 ounces water  daily.       03/11/2024    8:09 AM 01/07/2024    2:44 PM 01/01/2024    9:14 AM  Weight /BMI  Weight 251 lb 259 lb 255 lb 9.6 oz  Height 5' 9 (1.753 m) 5' 9 (1.753 m) 5' 9 (1.753 m)  BMI 37.07 kg/m2 38.25 kg/m2 37.75 kg/m2    Improved, dose increase in moumjaro, 8 to 10 pound weight loss  in 4 months  Essential hypertension Controlled, no change in medication DASH diet and commitment to daily physical activity for a minimum of 30 minutes discussed and encouraged, as a part of hypertension management. The importance of attaining a healthy weight is also discussed.     03/11/2024    8:09 AM 01/07/2024    2:44 PM 01/01/2024    9:14 AM 11/26/2023    1:24 PM 10/14/2023    8:19 AM 09/17/2023    3:28 PM 09/03/2023   11:13 AM  BP/Weight  Systolic BP 125 138 132 -- 129 130 123  Diastolic BP 65 72 72 -- 69 74 72  Wt. (Lbs) 251 259 255.6 250 249.08 249.8 248  BMI 37.07 kg/m2 38.25 kg/m2 37.75 kg/m2 36.92 kg/m2 36.78 kg/m2 36.89 kg/m2 36.62 kg/m2       Hyperlipidemia Hyperlipidemia:Low fat diet discussed and encouraged.   Lipid Panel  Lab Results  Component Value Date   CHOL 145 03/05/2024   HDL 44 03/05/2024   LDLCALC 84 03/05/2024   TRIG 89 03/05/2024   CHOLHDL 3.3 03/05/2024     Controlled, no change in medication  "

## 2024-03-11 NOTE — Telephone Encounter (Signed)
 Noted.

## 2024-03-11 NOTE — Patient Instructions (Addendum)
 F/u In 16 to 18 weeks  Weight loss goal of 8 to 10 pounds  Please schedule your mammogram due 7/3 or after, note pt chooses to schedule her mamogram  HBA1C, TSH and , bmp and EGFR and microalb , May 9 or after, non fasting  It is important that you exercise regularly at least 30 minutes 5 times a week. If you develop chest pain, have severe difficulty breathing, or feel very tired, stop exercising immediately and seek medical attention   Think about what you will eat, plan ahead. Choose  clean, green, fresh or frozen over canned, processed or packaged foods which are more sugary, salty and fatty. 70 to 75% of food eaten should be vegetables and fruit. Three meals at set times with snacks allowed between meals, but they must be fruit or vegetables. Aim to eat over a 12 hour period , example 7 am to 7 pm, and STOP after  your last meal of the day. Drink water ,generally about 64 ounces per day, no other drink is as healthy. Fruit juice is best enjoyed in a healthy way, by EATING the fruit.   New higher dose of Mounjaro  is 15 mg  this will start in February  Please call for your  f/u appointment with Gynecology, due this month   Thanks for choosing [facility], we consider it a privelige to serve you.'

## 2024-03-14 DIAGNOSIS — N764 Abscess of vulva: Secondary | ICD-10-CM | POA: Insufficient documentation

## 2024-03-14 DIAGNOSIS — I10 Essential (primary) hypertension: Secondary | ICD-10-CM | POA: Insufficient documentation

## 2024-03-14 DIAGNOSIS — L732 Hidradenitis suppurativa: Secondary | ICD-10-CM | POA: Insufficient documentation

## 2024-03-14 NOTE — Assessment & Plan Note (Addendum)
 Diabetes associated with hypertension, hyperlipidemia, obesity, and arthritis Increase dose of mounjaro   Theresa Adkins is reminded of the importance of commitment to daily physical activity for 30 minutes or more, as able and the need to limit carbohydrate intake to 30 to 60 grams per meal to help with blood sugar control.   The need to take medication as prescribed, test blood sugar as directed, and to call between visits if there is a concern that blood sugar is uncontrolled is also discussed.   Theresa Adkins is reminded of the importance of daily foot exam, annual eye examination, and good blood sugar, blood pressure and cholesterol control.     Latest Ref Rng & Units 03/05/2024    8:40 AM 10/09/2023    9:44 AM 06/26/2023    3:25 PM 06/19/2023    8:56 AM 03/13/2023   10:04 AM  Diabetic Labs  HbA1c 4.8 - 5.6 % 6.6  6.6   6.9  7.5   Micro/Creat Ratio 0 - 29 mg/g creat   14     Chol 100 - 199 mg/dL 854    824  837   HDL >60 mg/dL 44    56  46   Calc LDL 0 - 99 mg/dL 84    898  94   Triglycerides 0 - 149 mg/dL 89    898  874   Creatinine 0.57 - 1.00 mg/dL 9.13  9.21   9.23  9.15       03/11/2024    8:09 AM 01/07/2024    2:44 PM 01/01/2024    9:14 AM 11/26/2023    1:24 PM 10/14/2023    8:19 AM 09/17/2023    3:28 PM 09/03/2023   11:13 AM  BP/Weight  Systolic BP 125 138 132 -- 129 130 123  Diastolic BP 65 72 72 -- 69 74 72  Wt. (Lbs) 251 259 255.6 250 249.08 249.8 248  BMI 37.07 kg/m2 38.25 kg/m2 37.75 kg/m2 36.92 kg/m2 36.78 kg/m2 36.89 kg/m2 36.62 kg/m2      Latest Ref Rng & Units 10/14/2023    8:20 AM 04/17/2023   12:00 AM  Foot/eye exam completion dates  Eye Exam No Retinopathy  No Retinopathy      Foot Form Completion  Done      This result is from an external source.

## 2024-03-14 NOTE — Assessment & Plan Note (Signed)
" °  Patient re-educated about  the importance of commitment to a  minimum of 150 minutes of exercise per week as able.  The importance of healthy food choices with portion control discussed, as well as eating regularly and within a 12 hour window most days. The need to choose clean , green food 50 to 75% of the time is discussed, as well as to make water  the primary drink and set a goal of 64 ounces water  daily.       03/11/2024    8:09 AM 01/07/2024    2:44 PM 01/01/2024    9:14 AM  Weight /BMI  Weight 251 lb 259 lb 255 lb 9.6 oz  Height 5' 9 (1.753 m) 5' 9 (1.753 m) 5' 9 (1.753 m)  BMI 37.07 kg/m2 38.25 kg/m2 37.75 kg/m2    Improved, dose increase in moumjaro, 8 to 10 pound weight loss  in 4 months "

## 2024-03-14 NOTE — Assessment & Plan Note (Signed)
 Recurrent / persistent boil on vulva, gyne re eval/f/u

## 2024-03-14 NOTE — Assessment & Plan Note (Signed)
 Hyperlipidemia:Low fat diet discussed and encouraged.   Lipid Panel  Lab Results  Component Value Date   CHOL 145 03/05/2024   HDL 44 03/05/2024   LDLCALC 84 03/05/2024   TRIG 89 03/05/2024   CHOLHDL 3.3 03/05/2024     Controlled, no change in medication

## 2024-03-14 NOTE — Assessment & Plan Note (Addendum)
 Theresa Adkins

## 2024-03-14 NOTE — Assessment & Plan Note (Signed)
 Persistent problem in perineum, pt reports worsening refer Gyne to re eval and f/u

## 2024-03-14 NOTE — Assessment & Plan Note (Signed)
 Controlled, no change in medication DASH diet and commitment to daily physical activity for a minimum of 30 minutes discussed and encouraged, as a part of hypertension management. The importance of attaining a healthy weight is also discussed.     03/11/2024    8:09 AM 01/07/2024    2:44 PM 01/01/2024    9:14 AM 11/26/2023    1:24 PM 10/14/2023    8:19 AM 09/17/2023    3:28 PM 09/03/2023   11:13 AM  BP/Weight  Systolic BP 125 138 132 -- 129 130 123  Diastolic BP 65 72 72 -- 69 74 72  Wt. (Lbs) 251 259 255.6 250 249.08 249.8 248  BMI 37.07 kg/m2 38.25 kg/m2 37.75 kg/m2 36.92 kg/m2 36.78 kg/m2 36.89 kg/m2 36.62 kg/m2

## 2024-07-01 ENCOUNTER — Ambulatory Visit: Payer: Self-pay | Admitting: Family Medicine

## 2024-11-30 ENCOUNTER — Ambulatory Visit
